# Patient Record
Sex: Female | Born: 1945
Health system: Southern US, Community
[De-identification: ages and names within clinical notes are randomized; demographics above are authoritative.]

## PROBLEM LIST (undated history)

## (undated) DIAGNOSIS — H269 Unspecified cataract: Secondary | ICD-10-CM

## (undated) DIAGNOSIS — Z8701 Personal history of pneumonia (recurrent): Secondary | ICD-10-CM

## (undated) DIAGNOSIS — M069 Rheumatoid arthritis, unspecified: Secondary | ICD-10-CM

## (undated) DIAGNOSIS — M797 Fibromyalgia: Secondary | ICD-10-CM

## (undated) DIAGNOSIS — R519 Headache, unspecified: Secondary | ICD-10-CM

## (undated) DIAGNOSIS — R5382 Chronic fatigue, unspecified: Secondary | ICD-10-CM

## (undated) DIAGNOSIS — R42 Dizziness and giddiness: Secondary | ICD-10-CM

## (undated) DIAGNOSIS — F329 Major depressive disorder, single episode, unspecified: Secondary | ICD-10-CM

## (undated) DIAGNOSIS — Z973 Presence of spectacles and contact lenses: Secondary | ICD-10-CM

## (undated) DIAGNOSIS — R51 Headache: Secondary | ICD-10-CM

## (undated) DIAGNOSIS — Z8709 Personal history of other diseases of the respiratory system: Secondary | ICD-10-CM

## (undated) DIAGNOSIS — H409 Unspecified glaucoma: Secondary | ICD-10-CM

## (undated) DIAGNOSIS — E119 Type 2 diabetes mellitus without complications: Secondary | ICD-10-CM

## (undated) DIAGNOSIS — F32A Depression, unspecified: Secondary | ICD-10-CM

## (undated) DIAGNOSIS — G459 Transient cerebral ischemic attack, unspecified: Secondary | ICD-10-CM

## (undated) DIAGNOSIS — E7800 Pure hypercholesterolemia, unspecified: Secondary | ICD-10-CM

## (undated) HISTORY — PX: TONSILLECTOMY: SUR1361

## (undated) HISTORY — PX: KNEE ARTHROSCOPY: SUR90

## (undated) HISTORY — PX: DILATION AND CURETTAGE OF UTERUS: SHX78

## (undated) HISTORY — PX: APPENDECTOMY: SHX54

## (undated) HISTORY — PX: MOUTH SURGERY: SHX715

## (undated) HISTORY — PX: BILATERAL CARPAL TUNNEL RELEASE: SHX6508

## (undated) HISTORY — PX: GLAUCOMA SURGERY: SHX656

## (undated) HISTORY — PX: BACK SURGERY: SHX140

## (undated) NOTE — *Deleted (*Deleted)
Cardiology Office Note:    Date:  11/28/2019   ID:  Chinita Greenland, DOB May 08, 1945, MRN [id number] Poor p.o. PCP:  Juluis Rainier, MD  Cardiologist:  No primary care provider on file.   Referring MD: Cliffton Asters, MD   No chief complaint on file.   History of Present Illness:    [name] is a 7 y.o. female with a hx of elevated troponin T and d-dimer referred for clinical evaluation.Was sent to ER 09/14/19 with DC AMA due wait. History of infected right hip prosthesis, primary hypertension, hyperlipidemia, type 2 diabetes mellitus, and stress and anxiety related to her medical problems. Less than 20 11 PM I am glad to sleep last night  The patient saw Dr. Juluis Rainier on 09/14/2019 and relate a chest pain episode that occurred on the night prior to the office visit that lasted 1.5 hours. A troponin T was ordered and was elevated at 21 with upper limit of normal being 14 ng/L. D-dimer was also elevated at 3.22. Because of these laboratory abnormalities, she was instructed to go to the emergency room. She did go to the [facility] emergency room but left AGAINST MEDICAL ADVICE after staying there for an extended period of time without active management occurring. An EKG done in Dr. Zachery Dauer office revealed normal sinus rhythm, nonspecific T wave flattening, and left anterior hemiblock.  Since the episode of chest pain***  Past Medical History:  Diagnosis Date  . Chronic fatigue   . Depression   . Diabetes mellitus without complication (HCC)   . Early cataracts, bilateral   . Fibromyalgia   . Glaucoma   . Headache    Vestibular migraines  . History of bronchitis   . History of pneumonia   . Hypercholesterolemia   . RA (rheumatoid arthritis) (HCC)   . RA (rheumatoid arthritis) (HCC)   . TIA (transient ischemic attack)   . Vertigo   . Wears glasses     Past Surgical History:  Procedure Laterality Date  . APPENDECTOMY    . BACK SURGERY  2012, 2017  .  BILATERAL CARPAL TUNNEL RELEASE    . CESAREAN SECTION     x3  . DILATION AND CURETTAGE OF UTERUS    . GLAUCOMA SURGERY    . KNEE ARTHROSCOPY Left   . MOUTH SURGERY    . TONSILLECTOMY    . TOTAL HIP ARTHROPLASTY Right 01/30/2017   Procedure: RIGHT TOTAL HIP ARTHROPLASTY ANTERIOR APPROACH;  Surgeon: Kathryne Hitch, MD;  Location: [facility] ORS;  Service: Orthopedics;  Laterality: Right;  . TOTAL HIP REVISION Right 07/28/2019   Procedure: TOTAL HIP REVISION, posterior approach femoal comp;  Surgeon: Samson Frederic, MD;  Location: [facility] OR;  Service: Orthopedics;  Laterality: Right;    Current Medications: No outpatient medications have been marked as taking for the 11/29/19 encounter (Appointment) with Lyn Records, MD.     Allergies:   Sulfa antibiotics   Social History   Socioeconomic History  . Marital status: Widowed    Spouse name: Not on file  . Number of children: 4  . Years of education: 65  . Highest education level: Not on file  Occupational History  . Occupation: Retired    Comment: Runner, broadcasting/film/video  Tobacco Use  . Smoking status: Never Smoker  . Smokeless tobacco: Never Used  Vaping Use  . Vaping Use: Never used  Substance and Sexual Activity  . Alcohol use: No  . Drug use: No  . Sexual activity: Not  on file  Other Topics Concern  . Not on file  Social History Narrative   Lives at home alone   Right-handed   Caffeine: tea daily   Has 10 grandchildren   Social Determinants of Health   Financial Resource Strain:   . Difficulty of Paying Living Expenses: Not on file  Food Insecurity:   . Worried About Programme researcher, broadcasting/film/video in the Last Year: Not on file  . Ran Out of Food in the Last Year: Not on file  Transportation Needs:   . Lack of Transportation (Medical): Not on file  . Lack of Transportation (Non-Medical): Not on file  Physical Activity:   . Days of Exercise per Week: Not on file  . Minutes of Exercise per Session: Not on file  Stress:   . Feeling of  Stress : Not on file  Social Connections:   . Frequency of Communication with Friends and Family: Not on file  . Frequency of Social Gatherings with Friends and Family: Not on file  . Attends Religious Services: Not on file  . Active Member of Clubs or Organizations: Not on file  . Attends Banker Meetings: Not on file  . Marital Status: Not on file     Family History: The patient's family history includes Diabetes in her father and mother; Heart disease in her father, mother, and sister; High blood pressure in her father; Stroke in her father and mother.  ROS:   Please see the history of present illness.    *** All other systems reviewed and are negative.  EKGs/Labs/Other Studies Reviewed:    The following studies were reviewed today: ***  EKG:  EKG the [facility] health emergency room EKG done on 09/16/2019 revealed T wave flattening, normal sinus rhythm, left axis deviation, with poor R wave progression. This EKG was not substantially different than the tracing performed 1 day prior in Dr. Zachery Dauer office.  Recent Labs: 09/14/2019: ALT 11; BUN 10; Creatinine, Ser 0.92; Hemoglobin 10.8; Platelets 374; Potassium 3.8; Sodium 139  Recent Lipid Panel    Component Value Date/Time   CHOL (H) 02/25/2007 0520    217        ATP III CLASSIFICATION:  <200     mg/dL   Desirable  161-096  mg/dL   Borderline High  >=045    mg/dL   High   TRIG 409 81/19/1478 0520   HDL 33 (L) 02/25/2007 0520   CHOLHDL 6.6 02/25/2007 0520   VLDL 29 02/25/2007 0520   LDLCALC (H) 02/25/2007 0520    155        Total Cholesterol/HDL:CHD Risk Coronary Heart Disease Risk Table                     Men   Women  1/2 Average Risk   3.4   3.3    Physical Exam:    VS:  There were no vitals taken for this visit.    Wt Readings from Last 3 Encounters:  07/28/19 190 lb (86.2 kg)  07/20/19 200 lb 3 oz (90.8 kg)  04/27/19 196 lb (88.9 kg)     GEN: ***. No acute distress HEENT: Normal NECK: No JVD.  LYMPHATICS: No lymphadenopathy CARDIAC: *** RRR without murmur, gallop, or edema. VASCULAR: *** Normal Pulses. No bruits. RESPIRATORY:  Clear to auscultation without rales, wheezing or rhonchi  ABDOMEN: Soft, non-tender, non-distended, No pulsatile mass, MUSCULOSKELETAL: No deformity  SKIN: Warm and dry NEUROLOGIC:  Alert and oriented  x 3 PSYCHIATRIC:  Normal affect   ASSESSMENT:    No diagnosis found. PLAN:    In order of problems listed above:  1. ***   Medication Adjustments/Labs and Tests Ordered: Current medicines are reviewed at length with the patient today.  Concerns regarding medicines are outlined above.  No orders of the defined types were placed in this encounter.  No orders of the defined types were placed in this encounter.   There are no Patient Instructions on file for this visit.   Signed, Lesleigh Noe, MD  11/28/2019 11:32 AM    [facility]

---

## 1997-08-25 ENCOUNTER — Other Ambulatory Visit: Admission: RE | Admit: 1997-08-25 | Discharge: 1997-08-25 | Payer: Self-pay | Admitting: Obstetrics and Gynecology

## 1999-06-13 ENCOUNTER — Other Ambulatory Visit: Admission: RE | Admit: 1999-06-13 | Discharge: 1999-06-13 | Payer: Self-pay | Admitting: *Deleted

## 2000-05-06 ENCOUNTER — Other Ambulatory Visit: Admission: RE | Admit: 2000-05-06 | Discharge: 2000-05-06 | Payer: Self-pay | Admitting: *Deleted

## 2000-06-06 ENCOUNTER — Ambulatory Visit (HOSPITAL_COMMUNITY): Admission: RE | Admit: 2000-06-06 | Discharge: 2000-06-06 | Payer: Self-pay | Admitting: *Deleted

## 2000-06-06 ENCOUNTER — Encounter (INDEPENDENT_AMBULATORY_CARE_PROVIDER_SITE_OTHER): Payer: Self-pay

## 2000-07-16 ENCOUNTER — Other Ambulatory Visit: Admission: RE | Admit: 2000-07-16 | Discharge: 2000-07-16 | Payer: Self-pay | Admitting: *Deleted

## 2002-01-26 ENCOUNTER — Encounter: Admission: RE | Admit: 2002-01-26 | Discharge: 2002-01-26 | Payer: Self-pay | Admitting: Orthopedic Surgery

## 2002-01-26 ENCOUNTER — Encounter: Payer: Self-pay | Admitting: Orthopedic Surgery

## 2002-05-20 ENCOUNTER — Other Ambulatory Visit: Admission: RE | Admit: 2002-05-20 | Discharge: 2002-05-20 | Payer: Self-pay | Admitting: Obstetrics and Gynecology

## 2004-10-05 ENCOUNTER — Ambulatory Visit (HOSPITAL_COMMUNITY): Admission: RE | Admit: 2004-10-05 | Discharge: 2004-10-05 | Payer: Self-pay | Admitting: Family Medicine

## 2004-12-26 ENCOUNTER — Encounter: Admission: RE | Admit: 2004-12-26 | Discharge: 2004-12-26 | Payer: Self-pay | Admitting: Orthopedic Surgery

## 2007-02-25 ENCOUNTER — Encounter (INDEPENDENT_AMBULATORY_CARE_PROVIDER_SITE_OTHER): Payer: Self-pay | Admitting: Neurology

## 2007-02-25 ENCOUNTER — Inpatient Hospital Stay (HOSPITAL_COMMUNITY): Admission: EM | Admit: 2007-02-25 | Discharge: 2007-02-27 | Payer: Self-pay | Admitting: Emergency Medicine

## 2007-02-26 ENCOUNTER — Encounter (INDEPENDENT_AMBULATORY_CARE_PROVIDER_SITE_OTHER): Payer: Self-pay | Admitting: Internal Medicine

## 2007-08-21 ENCOUNTER — Encounter: Admission: RE | Admit: 2007-08-21 | Discharge: 2007-08-21 | Payer: Self-pay | Admitting: Orthopedic Surgery

## 2007-08-28 ENCOUNTER — Ambulatory Visit (HOSPITAL_COMMUNITY): Admission: RE | Admit: 2007-08-28 | Discharge: 2007-08-28 | Payer: Self-pay | Admitting: Orthopedic Surgery

## 2010-02-19 ENCOUNTER — Encounter
Admission: RE | Admit: 2010-02-19 | Discharge: 2010-02-19 | Payer: Self-pay | Source: Home / Self Care | Attending: Neurological Surgery | Admitting: Neurological Surgery

## 2010-06-15 ENCOUNTER — Encounter (HOSPITAL_COMMUNITY)
Admission: RE | Admit: 2010-06-15 | Discharge: 2010-06-15 | Disposition: A | Discharge status: Home / Self Care | Payer: BC Managed Care – PPO | Source: Ambulatory Visit | Attending: Neurological Surgery | Admitting: Neurological Surgery

## 2010-06-15 ENCOUNTER — Other Ambulatory Visit (HOSPITAL_COMMUNITY): Payer: Self-pay | Admitting: Neurological Surgery

## 2010-06-15 ENCOUNTER — Ambulatory Visit (HOSPITAL_COMMUNITY)
Admission: RE | Admit: 2010-06-15 | Discharge: 2010-06-15 | Disposition: A | Discharge status: Home / Self Care | Payer: BC Managed Care – PPO | Source: Ambulatory Visit | Attending: Neurological Surgery | Admitting: Neurological Surgery

## 2010-06-15 DIAGNOSIS — M48061 Spinal stenosis, lumbar region without neurogenic claudication: Secondary | ICD-10-CM | POA: Insufficient documentation

## 2010-06-15 DIAGNOSIS — Z01818 Encounter for other preprocedural examination: Secondary | ICD-10-CM | POA: Insufficient documentation

## 2010-06-15 DIAGNOSIS — Z01812 Encounter for preprocedural laboratory examination: Secondary | ICD-10-CM | POA: Insufficient documentation

## 2010-06-15 LAB — BASIC METABOLIC PANEL
BUN: 22 mg/dL (ref 6–23)
CO2: 31 mEq/L (ref 19–32)
Calcium: 11.1 mg/dL — ABNORMAL HIGH (ref 8.4–10.5)
Chloride: 102 mEq/L (ref 96–112)
Creatinine, Ser: 0.62 mg/dL (ref 0.4–1.2)

## 2010-06-15 LAB — CBC
MCH: 30.5 pg (ref 26.0–34.0)
MCV: 94.8 fL (ref 78.0–100.0)
Platelets: 286 10*3/uL (ref 150–400)
RDW: 13.5 % (ref 11.5–15.5)
WBC: 7.6 10*3/uL (ref 4.0–10.5)

## 2010-06-15 LAB — DIFFERENTIAL
Basophils Relative: 0 % (ref 0–1)
Eosinophils Absolute: 0.1 10*3/uL (ref 0.0–0.7)
Eosinophils Relative: 1 % (ref 0–5)
Lymphs Abs: 4 10*3/uL (ref 0.7–4.0)
Neutrophils Relative %: 40 % — ABNORMAL LOW (ref 43–77)

## 2010-06-15 LAB — TYPE AND SCREEN

## 2010-06-15 LAB — APTT: aPTT: 25 seconds (ref 24–37)

## 2010-06-15 LAB — ABO/RH: ABO/RH(D): O NEG

## 2010-06-15 LAB — PROTIME-INR: Prothrombin Time: 13 seconds (ref 11.6–15.2)

## 2010-06-19 NOTE — H&P (Signed)
[name], [name]              ACCOUNT NO.:  [id number]   MEDICAL RECORD NO.:  [id number]          PATIENT TYPE:  EMS   LOCATION:  MAJO                         FACILITY:  [facility]   PHYSICIAN:  Vinnie Level, MD    DATE OF BIRTH:  [birth year]   DATE OF ADMISSION:  02/24/2007  DATE OF DISCHARGE:                              HISTORY & PHYSICAL   PRIMARY CARE PHYSICIAN:  [name], M.D.  Dr. Vedia Coffer.   CHIEF COMPLAINT:  Weakness, confusion.   HISTORY OF PRESENT ILLNESS:  The patient is a 65 year old white female  with history of diabetes, who presents to the emergency department after  her daughters noted acute confusion.  Apparently she was watching TV and  then fell asleep on the couch.  Prior to watching TV, the patient  reported a vague headache and some nausea, no frank emesis.  She lay  down on the cough with general feeling of weakness, was down for an  unknown period of time when her daughters awoke per report, she was  having difficulty finding words and trying to tell her daughters to do  her home work.  Now the patient is frankly aphasic and having difficulty  with word production.  Comprehension appears intact but has acute  apraxia (difficulty utilizing specific objects) and inability to write  and difficulties with word finding.  Her daughters are not present at  the bedside at the present time.   PAST MEDICAL HISTORY:  1. Diabetes.   MEDICATIONS:  1. Actos 30 mg daily.  2. Tramadol 1 to 2 mg daily.  3. Recently the patient was on azithromycin for what sounds like a      upper respiratory infection and pseudoephedrine per patient's      report.  She is unsure when she last took the last dose of      pseudoephedrine.   ALLERGIES:  PENICILLIN which as a child caused hives.   SOCIAL HISTORY:  She lives with her four daughters, one of whom just  recently graduated from college.  She denies smoking, alcohol or other  drug use.  She notes she does not exercise  much.  She notes her diet is  poor.  She has extensive history of diabetes in the family with all her  own mother, father and siblings.  Apparently her husband had two  strokes.   REVIEW OF SYSTEMS:  CONSTITUTIONAL:  The patient denies recent fevers,  chills, sweats, weight changes or adenopathy.  HEENT:  Did endorse some vague headache, no sore throat.  Did recently  have some nasal discharge and occasional cough, no recent voice changes,  photophobia or changes in hearing or vision.  SKIN:  No rash or lesions.  CARDIOPULMONARY:  No chest pain, shortness of breath, dyspnea on  exertion, no palpitation.  She does have a cough that has been  productive.  No wheezing.  GENITOURINARY:  No urinary frequency, hesitancy, hematuria, nocturia.  NEUROPSYCH:  No numbness, mood disturbance, depression or anxiety.  She  did have marked weakness which is global.  She denied one side being  worse  than the other.  MUSCULOSKELETAL:  Full range of motion, no joint deformity.  GI:  Positive nausea, no emesis, normal stools, no bright red blood, no  melena, no hematemesis.  No change in bowel habits.  ENDOCRINE:  No polyuria, polydipsia, heat or cold intolerance.  NEUROLOGIC:  Acute word finding difficulty.  All other systems are reviewed and are negative except as noted above in  the HPI.   CODE STATUS:  Full.   OBJECTIVE:  VITAL SIGNS:  Temperature 98.7, pulse 84 to 102, respiratory  rate 18 to 20, blood pressure is 104 to 143/36 to 71.  Stated weight  180, oxygen saturations 99.6.  GENERAL:  No acute distress, pleasant interactive female, oriented to  place, year and president.  No acute distress.  HEENT:  Normocephalic and atraumatic, pupils are 3 to 4+ bilaterally  reactive to light.  Extraocular muscles  are intact.  Normal H test,  no  nystagmus.  Sclerae clear.  External pinna unremarkable.  Mucous  membranes moist, normal dentition.  Oropharynx clear without exudate or  erythema.  NECK:   Supple.  No jugular venous distention, no bruits.  Bilateral  normal carotid upstroke.  Adenopathy:  No cervical lymphadenopathy.  CARDIOVASCULAR:  Regular rate and rhythm, no irregularities noted.  No  murmurs, rubs or gallops.  Normal PMI.  LUNGS:  Clear to auscultation.  Occasional cough with upper airway  noise.  SKIN:  No rash or lesions.  BREASTS:  Exam deferred.  ABDOMEN:  Obese, nontender, nondistended, normal active bowel sounds.  RECTAL:  Deferred.  EXTREMITIES:  No cyanosis, clubbing or edema.  MUSCULOSKELETAL:  No joint deformity, tenderness.  NEUROLOGIC:  Alert, oriented x3.  Had some difficulties with word  productions, object naming.  Was intact for a pen, had difficulty naming  a television or a watch.  Was unable to add 2 + 2.  The patient had some  focal apraxia, was unable to write or attempt to draw a clock,  repeatedly drew letter M on a paper and appeared frustrated with this.  She was confusing her nursing call bell for her wallet.  Cranial nerves  2 through 12 appear grossly intact.  Strength:  Vaguely weaker on the  right than the left, brisk reflexes throughout her upper extremities,  normal in lower extremities.  Babinski is downgoing bilaterally.  Appears to have grossly normal sensation throughout.  Rapid alternating  movements bilateral upper extremities were intact and Romberg seated in  bed was normal.   RADIOLOGIC:  Head CT reveals no acute intracranial abnormality.  Right  maxillary sinusitis.   EKG reveals a rate of 100, normal sinus rhythm, left axis deviation.  No  prior.  She does appear to have first degree AV block.   LABORATORY DATA:  White count is 9.9, hemoglobin 13.3, normal  differential.  Platelet count 284,000.  Sodium is normal at 138,  potassium is 2.9, chloride 104, BUN 13, creatinine 0.7.  Initial cardiac  enzymes with MB of 1.2, troponin less than 0.05, myoglobin 61.  Coags  normal with a PTT of 25 and INR 0.9.  Urinalysis was  normal, many  squamous cells, 3 to 6 white cells, likely poor catch.   ASSESSMENT AND PLAN:  65 year old white female with history of diabetes  presents with clinical stroke and signs consistent with likely left  hemisphere small infarct.  She does have productive aphasia but her  comprehension appears fairly intact.  She has some difficulty with  naming and  apraxia as well.  1. Stroke.  No anticoagulation pending MRI.  No evidence of a bleed on      CT scan.  Routine stroke work-up including hemoglobin A1c,      transcranial Doppler, carotid Doppler and echocardiogram with      bubble study.  Will keep her on telemetry for the moment to rule      out atrial fibrillation though I see no evidence of such.  She had      no exam findings to point to an embolic phenomenon.  Neuro      apparently discussed the case with the emergency department given      that it was not a code stroke due to unclear temporal relationship      to her arrival in the emergency department they felt they would      consult in the morning.  Will check a routine fasting lipid profile      as well.  She reports no history of hypertension and has no      evidence of such at the moment.  2. Diabetes.  Will check hemoglobin A1c as above.  Continue with her      home Actos and put her on sliding scale insulin.  She does not      check her sugars at home so make sure that she has a Glucometer.  3. Fluids, electrolytes and nutrition.  Carb consistent diet.  I have      no concerns for dysphagia.  Glottal function appears intact.  Lytes      appear okay.  4. Recent upper respiratory infection.  Obviously avoid      pseudoephedrine.  No need to continue azithromycin at present time.  5. Disposition.  Admit telemetry bed.  6. Status code full.      Vinnie Level, MD  Electronically Signed     PMB/MEDQ  D:  02/25/2007  T:  02/25/2007  Job:  [id number]

## 2010-06-19 NOTE — Op Note (Signed)
NAMEMARVEEN, [name]               ACCOUNT NO.:  [id number]   MEDICAL RECORD NO.:  [id number]          PATIENT TYPE:  AMB   LOCATION:  DAY                          FACILITY:  [facility]   PHYSICIAN:  Marlowe Kays, M.D.  DATE OF BIRTH:  11-17-1945   DATE OF PROCEDURE:  08/28/2007  DATE OF DISCHARGE:                               OPERATIVE REPORT   PREOPERATIVE DIAGNOSIS:  Torn medial meniscus left knee.   POSTOPERATIVE DIAGNOSIS:  Torn medial meniscus left knee.   OPERATION:  Left knee arthroscopy, with partial medial meniscectomy.   SURGEON:  Marlowe Kays, M.D.   ASSISTANT:  Nurse.   ANESTHESIA:  General.   PATHOLOGY AND INDICATIONS OF PROCEDURE:  Because of pain in the inner  aspect of the left knee, an MRI was done.  This showed a midbody tear of  the medial meniscus.  There was also some question about the integrity  of the ACL.  (See description below.)   PROCEDURE:  Satisfactory general anesthesia, pneumatic tourniquet.  Left  leg was Esmarched out nonsterilely.  Thigh stabilizer applied.  Ace wrap  and knee support to right lower extremity.  Time-out performed.  DuraPrep from thigh stabilizer to ankle.  This area was draped into a  sterile field.  Superomedial saline inflow.  First, through an  anterolateral portal, the  medial compartment of the knee joint was  evaluated.  She had minimal chondromalacia.  She had a moderate tear  beginning at about the midbody, as describe on the MRI, into the  posterior third.  I resected this back to a stable rim with a  combination of baskets and shaving down until smooth with a 3.5 shaver,  with the remaining rim being stable on probing.  Looking at the medial  gutter and suprapatellar area, there was minimal wear in the patella.  Her ACL appeared to be completely intact, and a representative picture  was taken.  I then reversed portals.  There was some minimal wear in the  lateral joint, but no real tear.  The joint was then  irrigated until  clear and all fluid possible removed.  The two anterior portals were  closed with 4-0 nylon.  I injected 20 mL of 0.5% Marcaine with  adrenaline and 4 mg of morphine through the inflow apparatus, which was  removed, and this portal closed with 4-0 nylon as well.  Betadine and  Adaptic dry sterile dressing were applied.  Tourniquet was released.  She tolerated the procedure well and was taken to the recovery room in  satisfactory condition, with no known complications.           ______________________________  Marlowe Kays, M.D.     JA/MEDQ  D:  08/28/2007  T:  08/28/2007  Job:  [id number]

## 2010-06-19 NOTE — Consult Note (Signed)
NAME:  [name], FARAONE              ACCOUNT NO.:  [id number]   MEDICAL RECORD NO.:  [id number]          PATIENT TYPE:  INP   LOCATION:  3040                         FACILITY:  [facility]   PHYSICIAN:  Melvyn Novas, M.D.  DATE OF BIRTH:  1945/10/30   DATE OF CONSULTATION:  DATE OF DISCHARGE:                                 CONSULTATION   PATIENT INFORMATION:  This patient is a 65 year old Caucasian married  right-handed married female admitted to  Hollice Espy, M.D., February 25, 2007 at 3 a.m.   HISTORY:  This patient is a 65 year old married right-handed Caucasian  female with a history of non-insulin dependent diabetes mellitus, who  yesterday was in her usual state of health except for recovering from a  nasty upper respiratory tract infection.  She had just finished a Z-Pak  she states.  Her daughter a left her on the sofa when she went to work  after having a normal conversation with her.  When her daughter returned  almost 12 hours later, her mother was still in the same position on the  sofa, had not dressed, had not eaten and was just not herself.  Normally  a very active person according to daughter and husband.  She was abulic,  nauseated.  She did not use her right hand with the same dexterity.  She  appeared very clumsy and ataxic.  They also noted that her speech was  hesitant.  She seemed not to come out with the words or understand what  we say.   PAST MEDICAL HISTORY:  1. Diabetes.  2. Obesity.  The patient has a rather large neck for one woman.  I      raised the question of possible obstructive sleep apnea to make Dr.      Mellody Drown aware of a possible evaluation by PSG.   SOCIAL HISTORY:  The patient is married, has an adult daughter and  husband who are still in the work force.   HOME MEDICATIONS:  1. Actos 30 mg.  2. Recently finished a Z-Pak for upper respiratory tract infection.  3. She takes tramadol p.r.n. for back pain.   REVIEW OF SYSTEMS:   Positive for nausea, headache, stuttering and  hesitant speech.  In her own words, I cannot think.  I do not want to  eat.  She denies any vomiting.  She denies any pain except headaches.  No cough.  She does feels slightly confused.  Visually, she states that  bright lights have bothered her during the exam.  Her vital signs are  now stable and she has been afebrile.  A CT scan in the ER last night  showed no abnormality.   Today's vital signs show a temperature of 98 degrees Fahrenheit, pulse  of 84 that is regular, respirations 20.  There are no rales, no rhonchi.  Systolic blood pressures  123/68 diastolic, room air saturation is 96.  The patient was given 4  liters of oxygen according to the 6 a.m. vital signs entry.  When I  examined her, she was not on nasal oxygen.   LABORATORY  DATA:  The patient's creatine kinase was 82.  Troponin was  0.02.  There was no evidence of cardiac problem.  Sodium 138, potassium  3.8, glucose was elevated in the morning at 168, but the patient was not  able to take her regular medication yesterday.  A lipid profile had been  already added.  Fasting lipid cholesterol was 217 it is therefore  elevated, triglyceride 144, HDL is low at 33, LDL is high at 155.  A CBC  with diff showed a white blood cell count of 11.7, H&H 12.8 and 36.8,  platelet count was 258.  A routine urinalysis showed cloudy urine  without white blood cells.  A tox screen was not yet sent.   Mental status:  The patient is lethargic, almost abulic.  She responds  to bright light evaluation of her pupils with painful grimacing.  She  can speak, but she has a rather attenuated voice and she seems to have  difficulties processing verbal orders such as touch your nose with your  left hand.  She also is almost deliberately slow with her verbal  information and seems to search for words.  Cranial nerve examination  shows no facial asymmetry.  No nystagmus.  Full visual fields bilateral   and pupil reaction is equal.  There is no papilledema.  Tongue and uvula  deviation is not seen.  There is no facial numbness. In the motor  examination, deep tendon reflexes are symmetric, but there is an upgoing  toe on the right.  Her right grip is weaker.  Her right finger in the  finger-to-nose test is dysmetric and very slow.  Deep tendon reflexes  were actually rather brisk considering that this patient is diabetic.  She had normal sensory to touch and pinprick.  I did not observe her  gait, but according to her nurse, she was slow and unsteady.  She could  sit unattended, however, on the bedside without drifting to one side.   ASSESSMENT:  1. I suspect that the patient might have suffered a left brain stroke,      parietal temporal in a middle cerebral artery branch that left her      with mild aphasia or dysphasia and right-sided dysmetria.  There is      interestingly no sensory deficit.  2. She seems to describe a migraine headache.  3. She is just overcoming an upper respiratory tract infection.  Her      elevated white blood cell count might indicate that she is not yet      out of the woods.   PLAN:  I would obtain an MRI today, carotid Doppler and 2-D echo.  The  lipid panel cardiac enzymes that I had recommended on my order sheets  were already done and do not have to be repeated.  I would like to add a  tox screen.  I would like to give the patient  Depakene 1 gm fast  infusion by piggy-back.  The 1 gram should be given within 15 minutes  total as this helps with migrainous headaches and is not vasotonic.  The  stroke service was advised this morning of the patient.  I gave my check  out to [facility] by the nurse practitioner on the stroke service this  morning as Dr. Pearlean Brownie will round later today.      Melvyn Novas, M.D.  Electronically Signed     CD/MEDQ  D:  02/25/2007  T:  02/25/2007  Job:  [id number] 

## 2010-06-19 NOTE — Procedures (Signed)
EEG NUMBER:  [id number]   CLINICAL HISTORY:  The patient is a 65 year old who presented emergency  room found by his daughters with confusion and inability to use the  right hand.  The patient was not able to express herself. (293.0,784.3)   PROCEDURE:  The tracing is carried out on a 32 channel digital Cadwell  recorder reformatted into 16 channel montages with one devoted to EKG.  The patient was awake and asleep during the recording.  Medications  include Zofran, Lovenox, Actos, NovoLog, Tylenol, morphine sulfate,  Reglan, Avelox, Zocor, guaifenesin, Depakote, Phenergan, Ultram.  The  International 10/20 system lead placement was used.   DESCRIPTION OF FINDINGS:  Dominant frequency is 10 Hz 40 microvolt  activity mixed frequency 45-60 microvolt theta range activity, and 2-3  Hz 50 microvolt delta range activity was superimposed upon this.  There  is no focal slowing.  There was no interictal epileptiform activity form  of spikes or sharp waves.   EKG showed regular sinus rhythm with ventricular response of 78 beats  per minute.  Photic stimulation was induced.  It was performed and  failed to induce a driving response.  Hyperventilation caused arousal.   IMPRESSION:  Abnormal EEG on the basis of diffuse background slowing.  This is a nonspecific indicator of neuronal dysfunction that may be on a  primary degenerative basis or secondary to a variety of toxic or  metabolic etiologies.  The findings require clinical correlation.      Deanna Artis. Sharene Skeans, M.D.  Electronically Signed     [id number]  D:  02/26/2007 20:57:14  T:  02/27/2007 09:55:14  Job #:  [id number]   cc:   Melvyn Novas, M.D.  Fax: [phone]

## 2010-06-19 NOTE — Discharge Summary (Signed)
[name], [name]              ACCOUNT NO.:  [id number]   MEDICAL RECORD NO.:  [id number]          PATIENT TYPE:  INP   LOCATION:  3040                         FACILITY:  [facility]   PHYSICIAN:  Ramiro Harvest, MD    DATE OF BIRTH:  1945/03/12   DATE OF ADMISSION:  02/24/2007  DATE OF DISCHARGE:  02/27/2007                               DISCHARGE SUMMARY   PRIMARY CARE PHYSICIAN:  [facility].   DISCHARGE DIAGNOSES:  1. Left hemispheric transient ischemic attack.  2. Right maxillary sinusitis.  3. Type 2 diabetes.  4. Migraine headaches.  5. Hyperlipidemia.  6. Bilateral upper respiratory infection.  7. Hypokalemia.   DISCHARGE MEDICATIONS:  1. Avelox 400 mg p.o. daily times 8 days.  2. Mucinex 600 mg p.o. b.i.d. times 8 days.  3. Zocor 20 mg p.o. daily.  4. Aspirin 81 mg p.o. daily times 1 week and then off.  5. Aggrenox 1 tablet p.o. daily times 1 week and then 1 tablet p.o.      b.i.d.  6. Actos 30 mg p.o. daily.   DISPOSITION AND FOLLOW UP:  The patient will be discharged home to  follow up with her PCP in the next 1-2 weeks to reassess patient's  diabetes and maxillary sinusitis.  A basic metabolic profile needs to be  checked to follow up on the patient's electrolytes.  The patient will  also need to call to schedule a follow-up with Dr. Pearlean Brownie for outpatient  TCD bubble study and emboli study within the next month.  The patient  will need to call [phone] to get that set up.  The patient will also  need to need to be referred for a sleep study to rule out obstructive  sleep apnea.  The patient's PCP may call to schedule this outpatient  study for obstructive sleep apnea by calling [phone] with [facility]  Neurology to set this up. A C-met will likely need to be checked in  about 6-8 weeks to follow up on the patient's hepatic function.   CONSULTATIONS:  A neurology consult was done on February 25, 2007.  The  patient was seen by Dr. Porfirio Mylar  Dohmeier of [facility] neurology.   PROCEDURES DONE.:  1. 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  2. An MRI of the head was done on February 25, 2007 which showed no      acute infarct.  No intracranial mass detected in this unenhanced      examination, paranasal sinus mucosal thickening with air-fluid      level of the right maxillary sinus.  3. 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  Cavernous segment cannot be excluded.  Areas of minimal      vessels irregularity as discussed above.  4. Carotid Dopplers  were obtained on February 26, 2007 and the      preliminary did show a right 40-60% lower endobranch distal ICA      stenosis, probably due to tortuosity, left- no evidence of ICA      stenosis.  Bilateral vertebral artery flow was antegrade.  5. A 2-D echo was also obtained on February 26, 2007 which showed a      normal systolic function and EF of 60%, no left ventricular      regional wall motion abnormalities.  Left ventricular wall      thickness upper limits of normal.  No echocardiographic evidence      for a cardiac source of embolism.  6. Also an EEG was also done on February 25, 2007 with results pending      which may be followed up as an outpatient.   BRIEF ADMISSION HISTORY AND PHYSICAL:  [name] is a 65 year old  white female with a history of diabetes who presented to the ED after  her daughters noted acute confusion.  Apparently the patient was  watching TV and then fell asleep on the couch.  Prior to watching TV the  patient reported a vague headache and some nausea, no frank emesis.  The  patient lay on the couch with a general feeling of weakness.  Was down  for an unknown period of time.  When her daughter's woke her per  report  she was having difficulty finding words and trying to tell her daughters  to do her homework.  Now the patient is frankly aphasic and having  difficulty with word production.  Comprehension appeared intact but had  acute apraxia, difficulty utilizing specific objects and inability to  write and difficulties with word finding.  The patient's daughters were  not present at the bedside at the time of examination.   PHYSICAL EXAMINATION:  VITAL SIGNS:  Temperature 98.7, pulse of 84-102,  respiratory rate 18, blood pressure 143/71, sating 99.6%.  GENERAL:  The patient was in no acute distress, pleasant, interactive  female oriented to place, year and present in no acute distress.  HEENT: Normocephalic, atraumatic.  Pupils equal, round and reactive to  light.  Extraocular muscles were intact.  No nystagmus.  Sclera was  clear.  External pinna unremarkable.  Mucous membranes were moist.  Normal dentition.  Oropharynx was clear without exudate or erythema.  NECK:  Supple.  No JVD, no bruits.  Bilateral normal carotid upstroke.  No lymphadenopathy.  CARDIOVASCULAR:  Regular rate and rhythm.  No  murmurs, rubs or gallops.  LUNGS:  Clear to auscultation bilaterally.  Occasional cough with upper  airway noise.  SKIN:  No rashes or lesions.  ABDOMEN:  Soft, nontender, nondistended, positive bowel sounds.  EXTREMITIES:  No clubbing, cyanosis or edema.  NEUROLOGIC:  Alert and oriented x3.  Difficulties with word production  and object naming.  Intact for pain.  Had difficulty naming television or watch.  Was unable  to add 2+2.  The patient had some focal apraxia.  Was unable to write or  attempt to draw a clock.  Repeatedly during letter M on the paper and  appeared frustrated with this. The patient was confusing the nursing  call bell  for her wallet.  Cranial nerves II-XII appeared grossly  intact.  Strength vaguely weaker on the right than the left with brisk  reflexes throughout upper  extremities.  Normal in the lower extremities.  Babinski's are downgoing bilaterally.  Appeared to have  a grossly normal  sensation throughout.  Rapid alternating movements bilaterally in the  upper extremities were intact.  Romberg seated in the bed was normal.   ADMISSION LABS:  Head CT with results as stated above revealed no acute  intracranial abnormality, right maxillary sinusitis.  EKG; rate of 100,  normal sinus rhythm, left axis deviation.  No prior EKGs to compare.  Questionable first-degree AV block.  White count 9.9, hemoglobin 13.3,  platelets 284,000, sodium 138, potassium 2.9, chloride 104, BUN 13,  creatinine 0.7.  Initial cardiac enzymes; CK-MB of 1.2, troponin less  than 0.05, myoglobin 61.  Coag's normal.  PTT 25 and INR 0.9.  Urinalysis was normal.   HOSPITAL COURSE:  1. Left hemispheric TIA.  The patient's symptoms improved rapidly      during the hospitalization and was close to baseline after the      first day of admission.  A complete stroke workup was ordered and      done including A1c, carotid Dopplers, 2-D echo, CTs of the head,      MRI of the head as well with results as stated above.  Initial      workup came back negative.  The patient was placed on aspirin.      Neurology was consulted. They came and saw the patient in      consultation.  Felt the patient may have had a left hemispheric      TIA.  To continue long-term risk factor modification including      diabetes control, blood pressure control and hyperlipidemia.  The      patient was started on Zocor during that hospitalization and will      be continued on Zocor as an outpatient.  The patient will be      discharged home on aspirin 81 mg daily x 1 week in combination with      Aggrenox 1 tablet daily x 1 week and then Aggrenox will be      increased to 1 tablet twice a day.  The patient will follow-up with      Dr. Pearlean Brownie and have outpatient TCD bubble study and emboli study in      a month and  will follow-up in terms of a TIA with [facility]      Neurology.  On the day of discharge the patient was in improved and      stable condition and back to her baseline.  The patient will be      discharged home in stable and improved condition.  2. Type 2 diabetes.  Hemoglobin A1c was checked during the      hospitalization.  The patient's hemoglobin A1c was 6.4.  The      patient was maintained on a home dose of Actos.  The patient will      need to follow up with her diabetes with her outpatient DO      composite.  3. Right maxillary sinusitis as noted per CT.  The patient was started      on Avelox 400 mg daily.  The patient will complete a 10-day course      of Avelox 400 mg daily and will follow up with her PCP as an      outpatient.  4. Hypokalemia.  The patient's potassium was repleted during the      hospitalization.  A basic metabolic profile needs to be followed up      as an outpatient to  follow up on the patient's electrolytes.  5. Hyperlipidemia.  A fasting lipid panel was obtained during the      stroke workup.  The patient did have a total cholesterol of 217,      HDL of 33, LDL 155.  The patient was started on Zocor 20 mg daily.      This will need to be followed up as outpatient per PCP.  The      patient will likely need a comprehensive metabolic profile in a few      weeks to follow up on the patient's hepatic function.  6. Viral upper respiratory infection, stable.  The patient was placed      on Mucinex for symptomatic cough control and will be discharged      home on 8 days of Mucinex.   On the day of discharge the patient was in stable and improved  condition.  Discharge vital signs:  Temperature 98.3, pulse of 66, blood  pressure 127/76, respiratory rate 18, sating 94% on room air.   DISCHARGE LABS:  Sodium 141, potassium 3.3, chloride 106, bicarb 30, BUN  11, creatinine 0.62, glucose of 96, calcium of 9.3, white count 7.8,  hemoglobin 11.9, platelets of 219,  hematocrit 34.9.   It has been a pleasure taking care of Ms. Stonehocker.      Ramiro Harvest, MD  Electronically Signed     DT/MEDQ  D:  02/27/2007  T:  02/27/2007  Job:  [id number]   cc:   Pramod P. Pearlean Brownie, MD  Melvyn Novas, M.D.

## 2010-06-21 ENCOUNTER — Inpatient Hospital Stay (HOSPITAL_COMMUNITY)
Admission: RE | Admit: 2010-06-21 | Discharge: 2010-06-24 | DRG: 756 | Disposition: A | Discharge status: Home / Self Care | Payer: BC Managed Care – PPO | Source: Ambulatory Visit | Attending: Neurological Surgery | Admitting: Neurological Surgery

## 2010-06-21 ENCOUNTER — Ambulatory Visit (HOSPITAL_COMMUNITY)
Admission: RE | Admit: 2010-06-21 | Discharge: 2010-06-21 | Disposition: A | Discharge status: Home / Self Care | Payer: BC Managed Care – PPO | Source: Ambulatory Visit | Attending: Neurological Surgery | Admitting: Neurological Surgery

## 2010-06-21 ENCOUNTER — Other Ambulatory Visit (HOSPITAL_COMMUNITY): Payer: Self-pay | Admitting: Neurological Surgery

## 2010-06-21 DIAGNOSIS — Z01810 Encounter for preprocedural cardiovascular examination: Secondary | ICD-10-CM

## 2010-06-21 DIAGNOSIS — M4310 Spondylolisthesis, site unspecified: Principal | ICD-10-CM | POA: Diagnosis present

## 2010-06-21 DIAGNOSIS — Z880 Allergy status to penicillin: Secondary | ICD-10-CM

## 2010-06-21 DIAGNOSIS — M545 Low back pain: Secondary | ICD-10-CM

## 2010-06-21 DIAGNOSIS — Z01812 Encounter for preprocedural laboratory examination: Secondary | ICD-10-CM

## 2010-06-22 LAB — GLUCOSE, CAPILLARY: Glucose-Capillary: 200 mg/dL — ABNORMAL HIGH (ref 70–99)

## 2010-06-22 NOTE — Op Note (Signed)
[facility]  Patient:    [name], [name]                     MRN: [id number] Proc. Date: 06/06/00 Adm. Date:  [id number] Attending:  Marina Gravel B                           Operative Report  PREOPERATIVE DIAGNOSES:       1. Postmenopausal bleeding.                               2. Endometrial polyp.  POSTOPERATIVE DIAGNOSES:      1. Postmenopausal bleeding.                               2. Endometrial polyp.  PROCEDURE:                    1. Hysteroscopy.                               2. Removal of polyp.                               3. Dilation and curettage.  SURGEON:                      Marina Gravel, M.D.  ANESTHESIA:                   MAC and 16 cc 1% Nesacaine as paracervical                               block.  FINDINGS:                     Endometrial polyp and atrophic endometrium.  SPECIMENS:                    Endometrial polyp and uterine curettings.  FLUID DEFICIT:                60 cc of Sorbitol.  IV FLUIDS:                    900 cc LR.  COMPLICATIONS:                None.  DISPOSITION:                  Recovery room, stable.  DESCRIPTION OF PROCEDURE:     The patient was taken to the operating room and MAC anesthesia obtained. She was placed in the ski position and prepped and draped in standard fashion. Bladder was emptied with red rubber catheter. The patient was examined under anesthesia and found to have a normal size uterus, no adnexal masses palpable.  A speculum was inserted in the vagina and the anterior lip of the cervix grasped with a toothed tenaculum. Paracervical block was obtained with 16 cc of 1% Nesacaine.  The uterus was sounded to approximately 6 cm. It was easily dilated to a #31 without difficulty. The operative hysteroscope was assembled and flushed with Sorbitol. It was inserted into the  cavity and uterine distention obtained with Sorbitol. A single endometrial polyp was noted. The  endometrium appeared atrophic.  The scope was removed and the polyp grasped with Randall stone forceps and removed without difficulty. The uterus was curetted and scanty amount of tissue returned and submitted separately.  The hysteroscope was reinserted and the polyp was confirmed to be removed. Therefore, the procedure was terminated.  All instruments were removed from the vagina. The cervix was made hemostatic with silver nitrate.  The patient tolerated the procedure well. There were no complications. She was taken to the recovery room in awake, alert, and stable condition. DD:  06/06/00 TD:  06/07/00 Job: [id number]

## 2010-06-23 LAB — GLUCOSE, CAPILLARY

## 2010-06-24 LAB — GLUCOSE, CAPILLARY: Glucose-Capillary: 101 mg/dL — ABNORMAL HIGH (ref 70–99)

## 2010-07-17 ENCOUNTER — Other Ambulatory Visit: Payer: Self-pay | Admitting: Neurological Surgery

## 2010-07-17 ENCOUNTER — Ambulatory Visit
Admission: RE | Admit: 2010-07-17 | Discharge: 2010-07-17 | Disposition: A | Discharge status: Home / Self Care | Payer: BC Managed Care – PPO | Source: Ambulatory Visit | Attending: Neurological Surgery | Admitting: Neurological Surgery

## 2010-07-17 DIAGNOSIS — M79609 Pain in unspecified limb: Secondary | ICD-10-CM

## 2010-07-17 DIAGNOSIS — M5137 Other intervertebral disc degeneration, lumbosacral region: Secondary | ICD-10-CM

## 2010-07-17 DIAGNOSIS — M48061 Spinal stenosis, lumbar region without neurogenic claudication: Secondary | ICD-10-CM

## 2010-07-17 DIAGNOSIS — M4310 Spondylolisthesis, site unspecified: Secondary | ICD-10-CM

## 2010-07-17 NOTE — Op Note (Signed)
[name], [name]               ACCOUNT NO.:  [id number]  MEDICAL RECORD NO.:  [id number]           PATIENT TYPE:  I  LOCATION:  3009                         FACILITY:  [facility]  PHYSICIAN:  Tia Alert, MD     DATE OF BIRTH:  [birth year]  DATE OF PROCEDURE:  06/21/2010 DATE OF DISCHARGE:                              OPERATIVE REPORT   PREOPERATIVE DIAGNOSES: 1. Degenerative spondylolisthesis L3-4, L4-5. 2. Spinal stenosis L3-4, L4-5. 3. Back pain. 4. Leg pain.  POSTOPERATIVE DIAGNOSES: 1. Degenerative spondylolisthesis L3-4, L4-5. 2. Spinal stenosis L3-4, L4-5. 3. Back pain. 4. Leg pain.  PROCEDURES: 1. Anterolateral retroperitoneal interbody fusion L3-4, L4-5 utilizing     10-mm PEEK interbody cages packed with Osteocel Plus and Actifuse     putty. 2. Posterior percutaneous pedicle screw placement L3-L5 bilaterally     utilizing invasive pedicle screw system. 3. Intraoperative EMG monitoring.  SURGEON:  Tia Alert, MD  ASSISTANT:  Reinaldo Meeker, MD  ANESTHESIA:  General endotracheal.  COMPLICATIONS:  None apparent.  INDICATIONS FOR THE PROCEDURE:  [name] is a 65 year old female who presented with severe back pain with bilateral leg pain.  She had a serial imaging over the years which showed L3-4 spondylolisthesis with spinal stenosis over the last year.  She developed a spondylolisthesis at L4-5 with spinal stenosis.  She had significant back pain with bilateral leg pain.  I recommended an instrumented fusion in hopes of improving her pain syndrome.  She understood the risks, benefits and expected outcome and wished to proceed.  DESCRIPTION OF THE PROCEDURE:  The patient was taken to the operating room and after induction of adequate generalized endotracheal anesthesia, she was rolled into the right lateral decubitus position exposing her left thigh.  She was positioned in a typical anterolateral retroperitoneal approach position and tapped in  position, AP and lateral fluoroscopy were used to position her.  Her left side was prepped with DuraPrep and then draped in usual sterile fashion.  A 5 mL of local anesthesia was injected and incision was made directly over the L4-5 interspace.  An incision was made posterolateral to this over the L3-4 interspace.  Blunt finger dissection was used into the retroperitoneal space until the anterior face of the transverse process of the psoas musculature as well at the iliac crest below.  I swept the fatty tissues of the retroperitoneal space and then swept my finger to the lateral L4- 5 incision.  I then passed my first dilator using my finger down to the psoas musculature.  We checked our twitch test and then used lateral fluoroscopy to position our first dilator over the interspace.  We checked EMG monitoring and then gone a K-wire into position.  We then used sequential dilation until our final retractor was in place.  We monitored each dilator and the final retractor.  Once retractor was in place, we used AP and lateral fluoroscopy to get into final position, checked with our ball probe to make sure there were no neural structures within the working channel.  We then placed our intradiskal shim.  The disk space was then  incised and initial diskectomy done with pituitary rongeurs.  A Cobb curette was used to release the disk from the endplates and opened the opposite annulus.  I then used scrapers and shavers to prepare the endplates of being careful not to violate the endplate.  Once the discectomy was completed and the endplates were prepared, I used a 16 paddle to pass across the opposite annulus and did check our lateral fluoroscopy to make sure our trajectory was adequate. I then used sequential trials until the 10-mm standard trial seemed to fit the best via a 10 mm x 22 mm x 50 mm PEEK interbody cage and packed this with Osteocel Plus and Actifuse putty and using sliders,  tapped into position at L4-5 from the left lateral approach utilizing AP fluoroscopy.  Once it was then placed, we checked our final construct with AP and lateral fluoroscopy, removed our inserter, irrigated with saline solution containing bacitracin, dried all bleeding points, removed our retractor and checked our final construct once again.  We then moved up to the L3-4 interspace and made our lateral incision and then used the same posterolateral incision to pass my finger to the lateral incision and passed my first dilator down to the psoas musculature.  Again the same steps were then performed.  We used sequential dilation, monitoring of dilators as we went to our final retractor was in place.  We opened our retractor, checked with a ball probe to make sure no neural structures within our working channel, placed our intradiskal shim, checked our trajectory with AP and lateral fluoroscopy and then incised the disk space and performed a diskectomy the exact same way as we have done at L4-5.  We then used sequential trials until again the 10-mm trial fit the best.  We used a 10-mm lordotic trial at this level that was 10 mm x 22 mm x 50 mm we packed with Osteocel Plus and Actifuse putty, once again using sliders we tapped it in position from a lateral approach at L3-4.  We then irrigated with saline solution containing bacitracin, dried all bleeding points, removed our retractor, checked our final construct and then the incisions were closed with 0 Vicryl in the fascia, 2-0 Vicryl in the subcutaneous tissues, 3-0 Vicryl in the subcuticular tissues.  The skin was closed with Dermabond.  The patient was then repositioned into the prone position and once again she remained hooked to EMG monitoring. Her posterior lumbar region was prepped with DuraPrep and then draped in usual sterile fashion.  We brought in AP fluoroscopy in order to place our percutaneous pedicle screws.  We started in  the AP trajectory and for each pedicle localized our skin incision site.  The skin was incised and then Jamshidi needles were used at each pedicle.  We started at the outer portion of the pedicle in the AP trajectory and tapped our Jamshidi needle through the pedicle utilizing EMG monitoring and still was at the medial wall of the pedicle on the AP trajectory.  We then switched to the lateral trajectory to make sure that our Jamshidi needle was within the vertebral body.  We then placed our K-wires and removed our Jamshidi needles.  Once our K-wires were into position, we tapped with a 5 x 5 tap over each K-wire after dilating the musculature.  The dilators were then removed, then we placed 65 x 45 mm pedicle screws at L3, L4, and L5 pedicles over the K-wires on the left.  I then used  fascial knife to open the fascia and then passed a 75-mm rod through the towers from superior to inferior and then locked these in position with locking caps and antitorque device and then checked this with AP and lateral fluoroscopy.  I then went to the patient's left side and did the exact same thing.  We tapped over the K-wires through the dilator, removed the dilator and placed 6.5 x 40 mm pedicle screws on the patient's left side at L3 and L4 and 45-mm screw at L5.  We then used the fascial knife again and then passed our 75-mm rod from cranial to caudal and then locked this in position with locking caps and antitorque device.  We then checked our final construct with AP and lateral fluoroscopy.  It looked quite good.  We had reestablished disk space height, therefore foraminal cross-sectional area and ended upon indirect decompression in order to relieve her radicular pain.  The wounds were then closed in layers of 0 Vicryl in the fascia, 2-0 Vicryl in the subcutaneous tissues, 3-0 Vicryl in the subcuticular tissues.  The skin was closed with Dermabond.  The patient was then awakened from general  anesthesia and transferred to the recovery room in stable condition.  At the end of the procedure, all sponge, needle and instrument counts were correct.     Tia Alert, MD     DSJ/MEDQ  D:  06/21/2010  T:  06/22/2010  Job:  [id number]  Electronically Signed by Marikay Alar MD on 07/17/2010 09:46:14 AM

## 2010-07-18 NOTE — Discharge Summary (Signed)
  NAMEMAILYNN, EVERLY               ACCOUNT NO.:  [id number]  MEDICAL RECORD NO.:  [id number]  LOCATION:  3009                         FACILITY:  [facility]  PHYSICIAN:  Tia Alert, MD     DATE OF BIRTH:  1945/10/21  DATE OF ADMISSION:  06/21/2010 DATE OF DISCHARGE:  06/24/2010                              DISCHARGE SUMMARY   PROCEDURE:  Anterolateral retroperitoneal interbody fusion L3-4, L4-5 with percutaneous pedicle screws.  HOSPITAL COURSE:  The patient was admitted on Jun 21, 2010, and taken to the operating room where she underwent an XLIF at L3-4 and L4-5 followed by percutaneous pedicle screw placement.  The patient did very well following her surgery, was taken to the floor in stable condition.  For details of the operative procedure, please see the dictated operative note.  The patient's hospital course was routine.  There were no complications.  She remained afebrile with stable vital signs.  She tolerated regular diet.  Her pain was well controlled with a Dilaudid PCA for the first couple of days, has been changed to oral medications, which worked well for her.  She had resolution of her leg pain.  She had appropriate back soreness.  She was discharged home in stable condition on postop day #3 with plans to follow up in 2 weeks.  FINAL DIAGNOSIS:  XLIF, L3-4 and L45.     Tia Alert, MD     DSJ/MEDQ  D:  07/17/2010  T:  07/18/2010  Job:  [id number]  Electronically Signed by Marikay Alar MD on 07/18/2010 04:36:30 PM

## 2010-09-17 ENCOUNTER — Ambulatory Visit
Admission: RE | Admit: 2010-09-17 | Discharge: 2010-09-17 | Disposition: A | Discharge status: Home / Self Care | Payer: BC Managed Care – PPO | Source: Ambulatory Visit | Attending: Neurological Surgery | Admitting: Neurological Surgery

## 2010-09-17 ENCOUNTER — Other Ambulatory Visit: Payer: Self-pay | Admitting: Neurological Surgery

## 2010-09-17 DIAGNOSIS — M4310 Spondylolisthesis, site unspecified: Secondary | ICD-10-CM

## 2010-09-17 DIAGNOSIS — M48061 Spinal stenosis, lumbar region without neurogenic claudication: Secondary | ICD-10-CM

## 2010-10-26 LAB — URINALYSIS, ROUTINE W REFLEX MICROSCOPIC
Ketones, ur: NEGATIVE
Protein, ur: NEGATIVE
Urobilinogen, UA: 0.2

## 2010-10-26 LAB — CBC
HCT: 34.7 — ABNORMAL LOW
HCT: 38.9
Hemoglobin: 11.9 — ABNORMAL LOW
Hemoglobin: 13.3
MCHC: 34.1
MCHC: 34.7
MCV: 93.2
MCV: 93.3
Platelets: 258
Platelets: 259
RBC: 3.71 — ABNORMAL LOW
RBC: 3.95
RBC: 4.2
RDW: 12.7
RDW: 12.7
RDW: 12.9
WBC: 7.8
WBC: 8.4

## 2010-10-26 LAB — PROTIME-INR
INR: 0.9
Prothrombin Time: 12.6

## 2010-10-26 LAB — POCT CARDIAC MARKERS: Operator id: 282201

## 2010-10-26 LAB — DIFFERENTIAL
Basophils Absolute: 0
Basophils Absolute: 0
Eosinophils Absolute: 0.1
Eosinophils Relative: 0
Eosinophils Relative: 1
Lymphocytes Relative: 26
Lymphocytes Relative: 38
Lymphs Abs: 3.2
Monocytes Absolute: 0.4
Monocytes Relative: 4
Neutro Abs: 6.9
Neutrophils Relative %: 53

## 2010-10-26 LAB — COMPREHENSIVE METABOLIC PANEL
AST: 17
CO2: 29
Calcium: 9.6
Creatinine, Ser: 0.66
GFR calc Af Amer: >60
GFR calc non Af Amer: >60
Sodium: 138
Total Protein: 7

## 2010-10-26 LAB — BASIC METABOLIC PANEL
BUN: 9
CO2: 30
Calcium: 9.3
Calcium: 9.3
Creatinine, Ser: 0.62
GFR calc Af Amer: >60
GFR calc non Af Amer: >60
Glucose, Bld: 92
Sodium: 141

## 2010-10-26 LAB — CARDIAC PANEL(CRET KIN+CKTOT+MB+TROPI)
CK, MB: 1.2
CK, MB: 1.5
Relative Index: 1.1
Relative Index: INVALID
Total CK: 114
Total CK: 82
Total CK: 98
Troponin I: 0.02

## 2010-10-26 LAB — POCT I-STAT CREATININE
Creatinine, Ser: 0.7
Operator id: 282201

## 2010-10-26 LAB — I-STAT 8, (EC8 V) (CONVERTED LAB)
Chloride: 104
Glucose, Bld: 186 — ABNORMAL HIGH
HCT: 42
Potassium: 3.9
pCO2, Ven: 38 — ABNORMAL LOW
pH, Ven: 7.418 — ABNORMAL HIGH

## 2010-10-26 LAB — URINE MICROSCOPIC-ADD ON

## 2010-10-26 LAB — URINE CULTURE: Colony Count: 90000

## 2010-10-26 LAB — LIPID PANEL
Cholesterol: 217 — ABNORMAL HIGH
LDL Cholesterol: 155 — ABNORMAL HIGH
Total CHOL/HDL Ratio: 6.6
Triglycerides: 144

## 2010-10-26 LAB — APTT: aPTT: 25

## 2010-11-02 LAB — CBC
HCT: 38.9
Hemoglobin: 13.1
MCHC: 33.6
MCV: 93.9
RBC: 4.14
RDW: 13

## 2010-11-02 LAB — BASIC METABOLIC PANEL
CO2: 31
Chloride: 104
GFR calc Af Amer: >60
Glucose, Bld: 104 — ABNORMAL HIGH
Potassium: 4.1
Sodium: 143

## 2010-11-02 LAB — GLUCOSE, CAPILLARY

## 2012-07-29 ENCOUNTER — Other Ambulatory Visit: Payer: Self-pay | Admitting: Neurological Surgery

## 2012-07-29 DIAGNOSIS — M549 Dorsalgia, unspecified: Secondary | ICD-10-CM

## 2012-07-29 DIAGNOSIS — M546 Pain in thoracic spine: Secondary | ICD-10-CM

## 2012-08-11 ENCOUNTER — Other Ambulatory Visit: Payer: BC Managed Care – PPO

## 2013-02-11 ENCOUNTER — Other Ambulatory Visit: Payer: Self-pay | Admitting: Neurological Surgery

## 2013-02-11 DIAGNOSIS — M549 Dorsalgia, unspecified: Secondary | ICD-10-CM

## 2013-02-17 ENCOUNTER — Ambulatory Visit
Admission: RE | Admit: 2013-02-17 | Discharge: 2013-02-17 | Disposition: A | Discharge status: Home / Self Care | Payer: 59 | Source: Ambulatory Visit | Attending: Neurological Surgery | Admitting: Neurological Surgery

## 2013-02-17 DIAGNOSIS — M549 Dorsalgia, unspecified: Secondary | ICD-10-CM

## 2014-08-09 ENCOUNTER — Other Ambulatory Visit: Payer: Self-pay | Admitting: Neurological Surgery

## 2014-11-23 ENCOUNTER — Inpatient Hospital Stay (HOSPITAL_COMMUNITY): Admit: 2014-11-23 | Payer: BC Managed Care – PPO | Admitting: Neurological Surgery

## 2014-11-23 ENCOUNTER — Encounter (HOSPITAL_COMMUNITY): Payer: Self-pay

## 2014-11-23 SURGERY — FOR MAXIMUM ACCESS (MAS) POSTERIOR LUMBAR INTERBODY FUSION (PLIF) 1 LEVEL
Anesthesia: General | Site: Back

## 2015-05-02 ENCOUNTER — Other Ambulatory Visit: Payer: Self-pay | Admitting: Neurological Surgery

## 2015-07-12 NOTE — Pre-Procedure Instructions (Signed)
[name]  07/12/2015     No Pharmacies Listed   Your procedure is scheduled on Friday, June 16  Report to [facility] Admitting at 6:15 A.M.   Call this number if you have problems the morning of surgery:  [phone]   Remember:  Do not eat food or drink liquids after midnight.   Take these medicines the morning of surgery with A SIP OF WATER: Hydrocodone-acetaminophen (Norco/Vicodin) if needed, Ranitidine (Zantac).   WHAT DO I DO ABOUT MY DIABETES MEDICATION?   Marland Kitchen Do not take oral diabetes medicines (pills) the morning of surgery.  Do NOT take Actos the morning of surgery.    7 days prior to surgery STOP taking any Aspirin, Aleve, Naproxen, Ibuprofen, Motrin, Advil, Goody's, BC's, all herbal medications, fish oil, and all vitamins    Do not wear jewelry, make-up or nail polish.  Do not wear lotions, powders, or perfumes.  You may not wear deodorant.  Do not shave 48 hours prior to surgery.    Do not bring valuables to the hospital.   [facility] is not responsible for any belongings or valuables.  Contacts, dentures or bridgework may not be worn into surgery.  Leave your suitcase in the car.  After surgery it may be brought to your room.  For patients admitted to the hospital, discharge time will be determined by your treatment team.  Patients discharged the day of surgery will not be allowed to drive home.   Special instructions: review preparing for surgery handout  Please read over the following fact sheets that you were given. MRSA Information      How to Manage Your Diabetes Before and After Surgery  Why is it important to control my blood sugar before and after surgery? . Improving blood sugar levels before and after surgery helps healing and can limit problems. . A way of improving blood sugar control is eating a healthy diet by: o  Eating less sugar and carbohydrates o  Increasing activity/exercise o  Talking with your doctor about  reaching your blood sugar goals . High blood sugars (greater than 180 mg/dL) can raise your risk of infections and slow your recovery, so you will need to focus on controlling your diabetes during the weeks before surgery. . Make sure that the doctor who takes care of your diabetes knows about your planned surgery including the date and location.  How do I manage my blood sugar before surgery? . Check your blood sugar at least 4 times a day, starting 2 days before surgery, to make sure that the level is not too high or low. o Check your blood sugar the morning of your surgery when you wake up and every 2 hours until you get to the Short Stay unit. . If your blood sugar is less than 70 mg/dL, you will need to treat for low blood sugar: o Do not take insulin. o Treat a low blood sugar (less than 70 mg/dL) with  cup of clear juice (cranberry or apple), 4 glucose tablets, OR glucose gel. o Recheck blood sugar in 15 minutes after treatment (to make sure it is greater than 70 mg/dL). If your blood sugar is not greater than 70 mg/dL on recheck, call [phone] for further instructions. . Report your blood sugar to the short stay nurse when you get to Short Stay.  . If you are admitted to the hospital after surgery: o Your blood sugar will be checked by the staff  and you will probably be given insulin after surgery (instead of oral diabetes medicines) to make sure you have good blood sugar levels. o The goal for blood sugar control after surgery is 80-180 mg/dL.       [facility]- Preparing For Surgery  Before surgery, you can play an important role. Because skin is not sterile, your skin needs to be as free of germs as possible. You can reduce the number of germs on your skin by washing with CHG (chlorahexidine gluconate) Soap before surgery.  CHG is an antiseptic cleaner which kills germs and bonds with the skin to continue killing germs even after washing.  Please do not use if you have an  allergy to CHG or antibacterial soaps. If your skin becomes reddened/irritated stop using the CHG.  Do not shave (including legs and underarms) for at least 48 hours prior to first CHG shower. It is OK to shave your face.  Please follow these instructions carefully.   1. Shower the NIGHT BEFORE SURGERY and the MORNING OF SURGERY with CHG.   2. If you chose to wash your hair, wash your hair first as usual with your normal shampoo.  3. After you shampoo, rinse your hair and body thoroughly to remove the shampoo.  4. Use CHG as you would any other liquid soap. You can apply CHG directly to the skin and wash gently with a scrungie or a clean washcloth.   5. Apply the CHG Soap to your body ONLY FROM THE NECK DOWN.  Do not use on open wounds or open sores. Avoid contact with your eyes, ears, mouth and genitals (private parts). Wash genitals (private parts) with your normal soap.  6. Wash thoroughly, paying special attention to the area where your surgery will be performed.  7. Thoroughly rinse your body with warm water from the neck down.  8. DO NOT shower/wash with your normal soap after using and rinsing off the CHG Soap.  9. Pat yourself dry with a CLEAN TOWEL.   10. Wear CLEAN PAJAMAS   11. Place CLEAN SHEETS on your bed the night of your first shower and DO NOT SLEEP WITH PETS.    Day of Surgery: Do not apply any deodorants/lotions. Please wear clean clothes to the hospital/surgery center.

## 2015-07-13 ENCOUNTER — Encounter (HOSPITAL_COMMUNITY): Payer: Self-pay

## 2015-07-13 ENCOUNTER — Other Ambulatory Visit: Payer: Self-pay | Admitting: Neurological Surgery

## 2015-07-13 ENCOUNTER — Encounter (HOSPITAL_COMMUNITY)
Admission: RE | Admit: 2015-07-13 | Discharge: 2015-07-13 | Disposition: A | Discharge status: Home / Self Care | Payer: Medicare Other | Source: Ambulatory Visit | Attending: Neurological Surgery | Admitting: Neurological Surgery

## 2015-07-13 DIAGNOSIS — Z01812 Encounter for preprocedural laboratory examination: Secondary | ICD-10-CM | POA: Diagnosis not present

## 2015-07-13 DIAGNOSIS — Z01818 Encounter for other preprocedural examination: Secondary | ICD-10-CM | POA: Insufficient documentation

## 2015-07-13 DIAGNOSIS — Z0183 Encounter for blood typing: Secondary | ICD-10-CM | POA: Diagnosis not present

## 2015-07-13 DIAGNOSIS — M4310 Spondylolisthesis, site unspecified: Secondary | ICD-10-CM | POA: Diagnosis not present

## 2015-07-13 HISTORY — DX: Transient cerebral ischemic attack, unspecified: G45.9

## 2015-07-13 HISTORY — DX: Type 2 diabetes mellitus without complications: E11.9

## 2015-07-13 HISTORY — DX: Presence of spectacles and contact lenses: Z97.3

## 2015-07-13 HISTORY — DX: Personal history of other diseases of the respiratory system: Z87.09

## 2015-07-13 HISTORY — DX: Rheumatoid arthritis, unspecified: M06.9

## 2015-07-13 HISTORY — DX: Personal history of pneumonia (recurrent): Z87.01

## 2015-07-13 LAB — CBC WITH DIFFERENTIAL/PLATELET
BASOS PCT: 0 %
Basophils Absolute: 0 10*3/uL (ref 0.0–0.1)
EOS PCT: 1 %
Eosinophils Absolute: 0.1 10*3/uL (ref 0.0–0.7)
HEMATOCRIT: 38 % (ref 36.0–46.0)
HEMOGLOBIN: 11.9 g/dL — AB (ref 12.0–15.0)
Lymphocytes Relative: 46 %
Lymphs Abs: 3.1 10*3/uL (ref 0.7–4.0)
MCH: 32.2 pg (ref 26.0–34.0)
MCHC: 31.3 g/dL (ref 30.0–36.0)
MCV: 102.7 fL — AB (ref 78.0–100.0)
MONO ABS: 0.5 10*3/uL (ref 0.1–1.0)
MONOS PCT: 7 %
NEUTROS ABS: 3 10*3/uL (ref 1.7–7.7)
Neutrophils Relative %: 46 %
Platelets: 256 10*3/uL (ref 150–400)
RBC: 3.7 MIL/uL — AB (ref 3.87–5.11)
RDW: 14.8 % (ref 11.5–15.5)
WBC: 6.6 10*3/uL (ref 4.0–10.5)

## 2015-07-13 LAB — BASIC METABOLIC PANEL
ANION GAP: 5 (ref 5–15)
BUN: 20 mg/dL (ref 6–20)
CALCIUM: 10.1 mg/dL (ref 8.9–10.3)
CO2: 27 mmol/L (ref 22–32)
Chloride: 108 mmol/L (ref 101–111)
Creatinine, Ser: 0.79 mg/dL (ref 0.44–1.00)
GFR calc Af Amer: >60 mL/min (ref >60)
GFR calc non Af Amer: >60 mL/min (ref >60)
GLUCOSE: 107 mg/dL — AB (ref 65–99)
Potassium: 3.8 mmol/L (ref 3.5–5.1)
Sodium: 140 mmol/L (ref 135–145)

## 2015-07-13 LAB — PROTIME-INR
INR: 1.04 (ref 0.00–1.49)
PROTHROMBIN TIME: 13.8 s (ref 11.6–15.2)

## 2015-07-13 LAB — GLUCOSE, CAPILLARY: GLUCOSE-CAPILLARY: 135 mg/dL — AB (ref 65–99)

## 2015-07-13 LAB — SURGICAL PCR SCREEN
MRSA, PCR: NEGATIVE
STAPHYLOCOCCUS AUREUS: NEGATIVE

## 2015-07-13 NOTE — Progress Notes (Addendum)
PCP - Dr. Juluis Rainier Cardiologist - Dr. Sherril Croon at [facility]  EKG - requested from [facility] Cardiologist CXR - 07/13/15  Echo- 02/2007 Stress test - scheduled for 07/14/15 - requested Cardiac Cath - denies  Patient denies chest pain and shortness of breath at PAT appointment.  Patient states that she is having a stress test completed on 07/14/15 and will receive cardiac and medical clearance if stress test is ok.    Patient states that she checks her blood sugar twice a week and that her fasting glucose is in the low 100's.

## 2015-07-13 NOTE — Progress Notes (Signed)
Patient allergic to Penicillins but has Ancef 2g ordered.  Left message with Erie Noe at Dr. Yetta Barre office.

## 2015-07-14 LAB — HEMOGLOBIN A1C
Hgb A1c MFr Bld: 6.1 % — ABNORMAL HIGH (ref 4.8–5.6)
Mean Plasma Glucose: 128 mg/dL

## 2015-07-20 MED ORDER — VANCOMYCIN HCL IN DEXTROSE 1-5 GM/200ML-% IV SOLN
1000.0000 mg | INTRAVENOUS | Status: AC
  Administered 2015-07-21: 1000 mg via INTRAVENOUS
  Filled 2015-07-20: qty 200

## 2015-07-21 ENCOUNTER — Inpatient Hospital Stay (HOSPITAL_COMMUNITY): Payer: Medicare Other | Admitting: Anesthesiology

## 2015-07-21 ENCOUNTER — Encounter (HOSPITAL_COMMUNITY): Payer: Self-pay | Admitting: Surgery

## 2015-07-21 ENCOUNTER — Encounter (HOSPITAL_COMMUNITY)
Admission: RE | Disposition: A | Discharge status: Home / Self Care | Payer: Self-pay | Source: Ambulatory Visit | Attending: Neurological Surgery

## 2015-07-21 ENCOUNTER — Inpatient Hospital Stay (HOSPITAL_COMMUNITY)
Admission: RE | Admit: 2015-07-21 | Discharge: 2015-07-23 | DRG: 460 | Disposition: A | Discharge status: Home / Self Care | Payer: Medicare Other | Source: Ambulatory Visit | Attending: Neurological Surgery | Admitting: Neurological Surgery

## 2015-07-21 ENCOUNTER — Inpatient Hospital Stay (HOSPITAL_COMMUNITY): Payer: Medicare Other

## 2015-07-21 DIAGNOSIS — M5136 Other intervertebral disc degeneration, lumbar region: Secondary | ICD-10-CM | POA: Diagnosis present

## 2015-07-21 DIAGNOSIS — Z8673 Personal history of transient ischemic attack (TIA), and cerebral infarction without residual deficits: Secondary | ICD-10-CM | POA: Diagnosis not present

## 2015-07-21 DIAGNOSIS — Z880 Allergy status to penicillin: Secondary | ICD-10-CM

## 2015-07-21 DIAGNOSIS — E119 Type 2 diabetes mellitus without complications: Secondary | ICD-10-CM | POA: Diagnosis present

## 2015-07-21 DIAGNOSIS — M069 Rheumatoid arthritis, unspecified: Secondary | ICD-10-CM | POA: Diagnosis present

## 2015-07-21 DIAGNOSIS — Z79899 Other long term (current) drug therapy: Secondary | ICD-10-CM | POA: Diagnosis not present

## 2015-07-21 DIAGNOSIS — Z419 Encounter for procedure for purposes other than remedying health state, unspecified: Secondary | ICD-10-CM

## 2015-07-21 DIAGNOSIS — M4806 Spinal stenosis, lumbar region: Secondary | ICD-10-CM | POA: Diagnosis present

## 2015-07-21 DIAGNOSIS — Z981 Arthrodesis status: Secondary | ICD-10-CM

## 2015-07-21 DIAGNOSIS — M545 Low back pain: Secondary | ICD-10-CM | POA: Diagnosis present

## 2015-07-21 LAB — GLUCOSE, CAPILLARY
GLUCOSE-CAPILLARY: 132 mg/dL — AB (ref 65–99)
GLUCOSE-CAPILLARY: 160 mg/dL — AB (ref 65–99)
Glucose-Capillary: 155 mg/dL — ABNORMAL HIGH (ref 65–99)

## 2015-07-21 LAB — TYPE AND SCREEN
ABO/RH(D): O NEG
ABO/RH(D): O NEG
ANTIBODY SCREEN: NEGATIVE
ANTIBODY SCREEN: NEGATIVE

## 2015-07-21 SURGERY — POSTERIOR LUMBAR FUSION 1 WITH HARDWARE REMOVAL
Anesthesia: General | Site: Spine Lumbar

## 2015-07-21 MED ORDER — ONDANSETRON HCL 4 MG/2ML IJ SOLN
4.0000 mg | INTRAMUSCULAR | Status: DC | PRN
  Filled 2015-07-21: qty 2

## 2015-07-21 MED ORDER — SODIUM CHLORIDE 0.9 % IR SOLN
Status: DC | PRN
  Administered 2015-07-21: 08:00:00

## 2015-07-21 MED ORDER — FOLIC ACID 0.5 MG HALF TAB
1500.0000 ug | ORAL_TABLET | Freq: Every day | ORAL | Status: DC
  Administered 2015-07-22 – 2015-07-23 (×2): 1.5 mg via ORAL
  Filled 2015-07-21 (×3): qty 3

## 2015-07-21 MED ORDER — DEXAMETHASONE SODIUM PHOSPHATE 10 MG/ML IJ SOLN
10.0000 mg | INTRAMUSCULAR | Status: AC
  Administered 2015-07-21: 10 mg via INTRAVENOUS
  Filled 2015-07-21: qty 1

## 2015-07-21 MED ORDER — EZETIMIBE 10 MG PO TABS
10.0000 mg | ORAL_TABLET | Freq: Every day | ORAL | Status: DC
Start: 2015-07-21 — End: 2015-07-23
  Administered 2015-07-21 – 2015-07-23 (×3): 10 mg via ORAL
  Filled 2015-07-21 (×3): qty 1

## 2015-07-21 MED ORDER — 0.9 % SODIUM CHLORIDE (POUR BTL) OPTIME
TOPICAL | Status: DC | PRN
  Administered 2015-07-21: 1000 mL

## 2015-07-21 MED ORDER — HYDROMORPHONE HCL 1 MG/ML IJ SOLN
0.2500 mg | INTRAMUSCULAR | Status: DC | PRN
  Administered 2015-07-21 (×4): 0.5 mg via INTRAVENOUS

## 2015-07-21 MED ORDER — ACETAMINOPHEN 650 MG RE SUPP: 650.0000 mg | RECTAL | Status: DC | PRN

## 2015-07-21 MED ORDER — VANCOMYCIN HCL 1000 MG IV SOLR
INTRAVENOUS | Status: AC
  Filled 2015-07-21: qty 1000

## 2015-07-21 MED ORDER — SODIUM CHLORIDE 0.9 % IV SOLN
10.0000 mg | INTRAVENOUS | Status: DC | PRN
  Administered 2015-07-21: 25 ug/min via INTRAVENOUS

## 2015-07-21 MED ORDER — METHOCARBAMOL 1000 MG/10ML IJ SOLN
500.0000 mg | Freq: Four times a day (QID) | INTRAVENOUS | Status: DC | PRN
  Administered 2015-07-21: 500 mg via INTRAVENOUS
  Filled 2015-07-21 (×2): qty 5

## 2015-07-21 MED ORDER — FENTANYL CITRATE (PF) 100 MCG/2ML IJ SOLN
INTRAMUSCULAR | Status: DC | PRN
  Administered 2015-07-21 (×3): 50 ug via INTRAVENOUS
  Administered 2015-07-21: 100 ug via INTRAVENOUS

## 2015-07-21 MED ORDER — PROMETHAZINE HCL 25 MG/ML IJ SOLN: 6.2500 mg | INTRAMUSCULAR | Status: DC | PRN

## 2015-07-21 MED ORDER — PIOGLITAZONE HCL 30 MG PO TABS
30.0000 mg | ORAL_TABLET | Freq: Every day | ORAL | Status: DC
  Administered 2015-07-22 – 2015-07-23 (×2): 30 mg via ORAL
  Filled 2015-07-21 (×3): qty 1

## 2015-07-21 MED ORDER — SODIUM CHLORIDE 0.9% FLUSH
3.0000 mL | Freq: Two times a day (BID) | INTRAVENOUS | Status: DC
  Administered 2015-07-21 – 2015-07-23 (×3): 3 mL via INTRAVENOUS

## 2015-07-21 MED ORDER — THROMBIN 20000 UNITS EX SOLR
CUTANEOUS | Status: DC | PRN
  Administered 2015-07-21: 08:00:00 via TOPICAL

## 2015-07-21 MED ORDER — MENTHOL 3 MG MT LOZG
1.0000 | LOZENGE | OROMUCOSAL | Status: DC | PRN
Start: 2015-07-21 — End: 2015-07-23

## 2015-07-21 MED ORDER — LIDOCAINE 2% (20 MG/ML) 5 ML SYRINGE
INTRAMUSCULAR | Status: AC
  Filled 2015-07-21: qty 5

## 2015-07-21 MED ORDER — FENTANYL CITRATE (PF) 250 MCG/5ML IJ SOLN
INTRAMUSCULAR | Status: AC
  Filled 2015-07-21: qty 5

## 2015-07-21 MED ORDER — VANCOMYCIN HCL IN DEXTROSE 1-5 GM/200ML-% IV SOLN
1000.0000 mg | Freq: Two times a day (BID) | INTRAVENOUS | Status: DC
  Administered 2015-07-21 – 2015-07-23 (×4): 1000 mg via INTRAVENOUS
  Filled 2015-07-21 (×5): qty 200

## 2015-07-21 MED ORDER — CELECOXIB 200 MG PO CAPS
200.0000 mg | ORAL_CAPSULE | Freq: Two times a day (BID) | ORAL | Status: DC
  Administered 2015-07-21 – 2015-07-23 (×5): 200 mg via ORAL
  Filled 2015-07-21 (×5): qty 1

## 2015-07-21 MED ORDER — LIDOCAINE HCL (CARDIAC) 20 MG/ML IV SOLN: INTRAVENOUS | Status: DC | PRN

## 2015-07-21 MED ORDER — METHOCARBAMOL 500 MG PO TABS
500.0000 mg | ORAL_TABLET | Freq: Four times a day (QID) | ORAL | Status: DC | PRN
  Administered 2015-07-21 – 2015-07-22 (×3): 500 mg via ORAL
  Filled 2015-07-21 (×4): qty 1

## 2015-07-21 MED ORDER — VANCOMYCIN HCL 1000 MG IV SOLR
INTRAVENOUS | Status: DC | PRN
  Administered 2015-07-21: 1000 mg

## 2015-07-21 MED ORDER — ONDANSETRON HCL 4 MG/2ML IJ SOLN
INTRAMUSCULAR | Status: DC | PRN
  Administered 2015-07-21: 4 mg via INTRAVENOUS

## 2015-07-21 MED ORDER — GLYCOPYRROLATE 0.2 MG/ML IJ SOLN
INTRAMUSCULAR | Status: DC | PRN
  Administered 2015-07-21: 0.2 mg via INTRAVENOUS

## 2015-07-21 MED ORDER — PHENOL 1.4 % MT LIQD: 1.0000 | OROMUCOSAL | Status: DC | PRN

## 2015-07-21 MED ORDER — ONDANSETRON HCL 4 MG/2ML IJ SOLN
INTRAMUSCULAR | Status: AC
  Filled 2015-07-21: qty 2

## 2015-07-21 MED ORDER — OXYCODONE-ACETAMINOPHEN 5-325 MG PO TABS
1.0000 | ORAL_TABLET | ORAL | Status: DC | PRN
  Administered 2015-07-21 – 2015-07-22 (×5): 2 via ORAL
  Administered 2015-07-23: 1 via ORAL
  Administered 2015-07-23: 2 via ORAL
  Filled 2015-07-21 (×3): qty 2
  Filled 2015-07-21: qty 1
  Filled 2015-07-21 (×3): qty 2

## 2015-07-21 MED ORDER — ARTIFICIAL TEARS OP OINT
TOPICAL_OINTMENT | OPHTHALMIC | Status: AC
  Filled 2015-07-21: qty 3.5

## 2015-07-21 MED ORDER — SUGAMMADEX SODIUM 200 MG/2ML IV SOLN
INTRAVENOUS | Status: AC
  Filled 2015-07-21: qty 2

## 2015-07-21 MED ORDER — MIDAZOLAM HCL 2 MG/2ML IJ SOLN
INTRAMUSCULAR | Status: AC
  Filled 2015-07-21: qty 2

## 2015-07-21 MED ORDER — MIDAZOLAM HCL 5 MG/5ML IJ SOLN
INTRAMUSCULAR | Status: DC | PRN
  Administered 2015-07-21: 2 mg via INTRAVENOUS

## 2015-07-21 MED ORDER — HYDROMORPHONE HCL 1 MG/ML IJ SOLN
INTRAMUSCULAR | Status: AC
  Filled 2015-07-21: qty 1

## 2015-07-21 MED ORDER — LIDOCAINE 2% (20 MG/ML) 5 ML SYRINGE
INTRAMUSCULAR | Status: DC | PRN
  Administered 2015-07-21: 40 mg via INTRAVENOUS

## 2015-07-21 MED ORDER — SODIUM CHLORIDE 0.9% FLUSH: 3.0000 mL | INTRAVENOUS | Status: DC | PRN

## 2015-07-21 MED ORDER — GLYCOPYRROLATE 0.2 MG/ML IV SOSY
PREFILLED_SYRINGE | INTRAVENOUS | Status: AC
  Filled 2015-07-21: qty 3

## 2015-07-21 MED ORDER — POTASSIUM CHLORIDE IN NACL 20-0.9 MEQ/L-% IV SOLN
INTRAVENOUS | Status: DC
  Administered 2015-07-21: 16:00:00 via INTRAVENOUS
  Filled 2015-07-21 (×5): qty 1000

## 2015-07-21 MED ORDER — MORPHINE SULFATE (PF) 2 MG/ML IV SOLN
1.0000 mg | INTRAVENOUS | Status: DC | PRN
  Administered 2015-07-21: 4 mg via INTRAVENOUS
  Administered 2015-07-21 – 2015-07-22 (×2): 2 mg via INTRAVENOUS
  Administered 2015-07-22: 4 mg via INTRAVENOUS
  Filled 2015-07-21: qty 1
  Filled 2015-07-21 (×2): qty 2
  Filled 2015-07-21: qty 1

## 2015-07-21 MED ORDER — SUGAMMADEX SODIUM 200 MG/2ML IV SOLN
INTRAVENOUS | Status: DC | PRN
  Administered 2015-07-21: 200 mg via INTRAVENOUS

## 2015-07-21 MED ORDER — BUPIVACAINE HCL (PF) 0.25 % IJ SOLN
INTRAMUSCULAR | Status: DC | PRN
  Administered 2015-07-21: 3 mL

## 2015-07-21 MED ORDER — THROMBIN 5000 UNITS EX SOLR
OROMUCOSAL | Status: DC | PRN
  Administered 2015-07-21: 08:00:00 via TOPICAL

## 2015-07-21 MED ORDER — PROPOFOL 10 MG/ML IV BOLUS
INTRAVENOUS | Status: DC | PRN
  Administered 2015-07-21: 170 mg via INTRAVENOUS

## 2015-07-21 MED ORDER — FAMOTIDINE 20 MG PO TABS
20.0000 mg | ORAL_TABLET | Freq: Every day | ORAL | Status: DC
  Administered 2015-07-22 – 2015-07-23 (×2): 20 mg via ORAL
  Filled 2015-07-21 (×2): qty 1

## 2015-07-21 MED ORDER — ROCURONIUM BROMIDE 100 MG/10ML IV SOLN
INTRAVENOUS | Status: DC | PRN
  Administered 2015-07-21: 20 mg via INTRAVENOUS
  Administered 2015-07-21: 50 mg via INTRAVENOUS

## 2015-07-21 MED ORDER — LACTATED RINGERS IV SOLN
INTRAVENOUS | Status: DC
  Administered 2015-07-21 (×2): via INTRAVENOUS

## 2015-07-21 MED ORDER — ACETAMINOPHEN 325 MG PO TABS: 650.0000 mg | ORAL_TABLET | ORAL | Status: DC | PRN

## 2015-07-21 MED ORDER — FENOFIBRATE 54 MG PO TABS
54.0000 mg | ORAL_TABLET | Freq: Every day | ORAL | Status: DC
  Administered 2015-07-22 – 2015-07-23 (×2): 54 mg via ORAL
  Filled 2015-07-21 (×3): qty 1

## 2015-07-21 MED ORDER — PROPOFOL 10 MG/ML IV BOLUS
INTRAVENOUS | Status: AC
  Filled 2015-07-21: qty 20

## 2015-07-21 MED ORDER — SODIUM CHLORIDE 0.9 % IV SOLN: 250.0000 mL | INTRAVENOUS | Status: DC

## 2015-07-21 SURGICAL SUPPLY — 66 items
ADH SKN CLS APL DERMABOND .7 (GAUZE/BANDAGES/DRESSINGS) ×1
APL SKNCLS STERI-STRIP NONHPOA (GAUZE/BANDAGES/DRESSINGS) ×1
BAG DECANTER FOR FLEXI CONT (MISCELLANEOUS) ×3 IMPLANT
BENZOIN TINCTURE PRP APPL 2/3 (GAUZE/BANDAGES/DRESSINGS) ×3 IMPLANT
BLADE CLIPPER SURG (BLADE) IMPLANT
BONE MATRIX OSTEOCEL PRO SM (Bone Implant) ×2 IMPLANT
BUR MATCHSTICK NEURO 3.0 LAGG (BURR) ×3 IMPLANT
CAGE COROENT PLIF 10X28-8 LUMB (Cage) ×4 IMPLANT
CANISTER SUCT 3000ML PPV (MISCELLANEOUS) ×3 IMPLANT
CLOSURE WOUND 1/2 X4 (GAUZE/BANDAGES/DRESSINGS) ×1
CONT SPEC 4OZ CLIKSEAL STRL BL (MISCELLANEOUS) ×3 IMPLANT
COVER BACK TABLE 60X90IN (DRAPES) ×3 IMPLANT
DERMABOND ADVANCED (GAUZE/BANDAGES/DRESSINGS) ×2
DERMABOND ADVANCED .7 DNX12 (GAUZE/BANDAGES/DRESSINGS) IMPLANT
DRAPE C-ARM 42X72 X-RAY (DRAPES) ×6 IMPLANT
DRAPE LAPAROTOMY 100X72X124 (DRAPES) ×3 IMPLANT
DRAPE POUCH INSTRU U-SHP 10X18 (DRAPES) ×3 IMPLANT
DRAPE SURG 17X23 STRL (DRAPES) ×3 IMPLANT
DRSG OPSITE POSTOP 4X6 (GAUZE/BANDAGES/DRESSINGS) ×2 IMPLANT
DURAPREP 26ML APPLICATOR (WOUND CARE) ×3 IMPLANT
ELECT REM PT RETURN 9FT ADLT (ELECTROSURGICAL) ×3
ELECTRODE REM PT RTRN 9FT ADLT (ELECTROSURGICAL) ×1 IMPLANT
EVACUATOR 1/8 PVC DRAIN (DRAIN) ×3 IMPLANT
GAUZE SPONGE 4X4 16PLY XRAY LF (GAUZE/BANDAGES/DRESSINGS) IMPLANT
GLOVE BIO SURGEON STRL SZ8 (GLOVE) ×6 IMPLANT
GLOVE BIOGEL PI IND STRL 6.5 (GLOVE) IMPLANT
GLOVE BIOGEL PI IND STRL 7.5 (GLOVE) IMPLANT
GLOVE BIOGEL PI INDICATOR 6.5 (GLOVE) ×2
GLOVE BIOGEL PI INDICATOR 7.5 (GLOVE) ×6
GLOVE SS BIOGEL STRL SZ 7 (GLOVE) IMPLANT
GLOVE SUPERSENSE BIOGEL SZ 7 (GLOVE) ×2
GLOVE SURG SS PI 6.5 STRL IVOR (GLOVE) ×4 IMPLANT
GOWN STRL REUS W/ TWL LRG LVL3 (GOWN DISPOSABLE) IMPLANT
GOWN STRL REUS W/ TWL XL LVL3 (GOWN DISPOSABLE) ×2 IMPLANT
GOWN STRL REUS W/TWL 2XL LVL3 (GOWN DISPOSABLE) IMPLANT
GOWN STRL REUS W/TWL LRG LVL3 (GOWN DISPOSABLE) ×12
GOWN STRL REUS W/TWL XL LVL3 (GOWN DISPOSABLE) ×6
HEMOSTAT POWDER KIT SURGIFOAM (HEMOSTASIS) ×2 IMPLANT
KIT BASIN OR (CUSTOM PROCEDURE TRAY) ×3 IMPLANT
KIT INFUSE X SMALL 1.4CC (Orthopedic Implant) ×2 IMPLANT
KIT ROOM TURNOVER OR (KITS) ×3 IMPLANT
MILL MEDIUM DISP (BLADE) ×2 IMPLANT
NDL HYPO 25X1 1.5 SAFETY (NEEDLE) ×1 IMPLANT
NEEDLE HYPO 25X1 1.5 SAFETY (NEEDLE) ×3 IMPLANT
NS IRRIG 1000ML POUR BTL (IV SOLUTION) ×3 IMPLANT
PACK LAMINECTOMY NEURO (CUSTOM PROCEDURE TRAY) ×3 IMPLANT
PAD ARMBOARD 7.5X6 YLW CONV (MISCELLANEOUS) ×13 IMPLANT
PRECEPT SHANK CANN 6.5X40 (Neuro Prosthesis/Implant) ×4 IMPLANT
RASP 3.0MM (RASP) ×2 IMPLANT
ROD 35MM (Rod) ×4 IMPLANT
SCREW LOCK (Screw) ×12 IMPLANT
SCREW LOCK FXNS SPNE MAS PL (Screw) IMPLANT
SCREW SHANK PRECEPT 6.5X35 (Screw) ×4 IMPLANT
SCREW TULIP 5.5 (Screw) ×8 IMPLANT
SPONGE LAP 4X18 X RAY DECT (DISPOSABLE) IMPLANT
SPONGE SURGIFOAM ABS GEL 100 (HEMOSTASIS) ×3 IMPLANT
STRIP CLOSURE SKIN 1/2X4 (GAUZE/BANDAGES/DRESSINGS) ×3 IMPLANT
SUT VIC AB 0 CT1 18XCR BRD8 (SUTURE) ×1 IMPLANT
SUT VIC AB 0 CT1 8-18 (SUTURE) ×6
SUT VIC AB 2-0 CP2 18 (SUTURE) ×5 IMPLANT
SUT VIC AB 3-0 SH 8-18 (SUTURE) ×6 IMPLANT
TOWEL OR 17X24 6PK STRL BLUE (TOWEL DISPOSABLE) ×3 IMPLANT
TOWEL OR 17X26 10 PK STRL BLUE (TOWEL DISPOSABLE) ×3 IMPLANT
TRAP SPECIMEN MUCOUS 40CC (MISCELLANEOUS) ×2 IMPLANT
TRAY FOLEY W/METER SILVER 16FR (SET/KITS/TRAYS/PACK) ×3 IMPLANT
WATER STERILE IRR 1000ML POUR (IV SOLUTION) ×3 IMPLANT

## 2015-07-21 NOTE — Transfer of Care (Signed)
Immediate Anesthesia Transfer of Care Note  Patient: [name]  Procedure(s) Performed: Procedure(s): Posterior Lumbar Interbody Fusion Lumbar Five-Sacral One, Remove percutaneous screws Lumbar Three, Lumbar Four (N/A)  Patient Location: PACU  Anesthesia Type:General  Level of Consciousness: sedated  Airway & Oxygen Therapy: Patient Spontanous Breathing and Patient connected to face mask oxygen  Post-op Assessment: Report given to RN, Post -op Vital signs reviewed and stable and Patient moving all extremities  Post vital signs: Reviewed and stable  Last Vitals:  Filed Vitals:   07/21/15 0627  BP: 137/54  Pulse: 62  Temp: 37.1 C  Resp: 18    Last Pain:  Filed Vitals:   07/21/15 0702  PainSc: 7       Patients Stated Pain Goal: 2 (07/21/15 0700)  Complications: No apparent anesthesia complications

## 2015-07-21 NOTE — Progress Notes (Signed)
Pt arrived to 5C17 via stretcher.  Alert and oriented.  C/o pain 10/10 in surgical area, repositioned for comfort.  VSS.  Will continue to monitor.  Sondra Come, RN

## 2015-07-21 NOTE — H&P (Signed)
Subjective: Patient is a 70 y.o. female admitted for PLIF. Onset of symptoms was several months ago, gradually worsening since that time.  The pain is rated severe, and is located at the across the lower back and radiates to LLE. The pain is described as aching and occurs all day. The symptoms have been progressive. Symptoms are exacerbated by exercise. MRI or CT showed spondylolisthesis with stenosis L5-S1 adjacent to previous surgery   Past Medical History  Diagnosis Date  . Diabetes mellitus without complication (HCC)   . History of pneumonia   . RA (rheumatoid arthritis) (HCC)   . Wears glasses   . TIA (transient ischemic attack)   . History of bronchitis     Past Surgical History  Procedure Laterality Date  . Back surgery  2012  . Cesarean section      x3  . Bilateral carpal tunnel release    . Knee arthroscopy Left   . Tonsillectomy    . Appendectomy    . Glaucoma surgery      Prior to Admission medications   Medication Sig Start Date End Date Taking? Authorizing Provider  diclofenac (VOLTAREN) 75 MG EC tablet Take 75 mg by mouth 2 (two) times daily.   Yes Historical Provider, MD  ezetimibe (ZETIA) 10 MG tablet Take 10 mg by mouth daily.   Yes Historical Provider, MD  fenofibrate 54 MG tablet Take 54 mg by mouth daily.   Yes Historical Provider, MD  folic acid (FOLVITE) 800 MCG tablet Take 1,600 mcg by mouth daily.   Yes Historical Provider, MD  HYDROcodone-acetaminophen (NORCO/VICODIN) 5-325 MG tablet Take 1 tablet by mouth every 12 (twelve) hours.   Yes Historical Provider, MD  methotrexate 50 MG/2ML injection Inject 20 mg into the vein every Tuesday.    Yes Historical Provider, MD  pioglitazone (ACTOS) 30 MG tablet Take 30 mg by mouth daily.   Yes Historical Provider, MD  ranitidine (ZANTAC) 75 MG tablet Take 75 mg by mouth daily. For 3 days prior to Remicade infusion   Yes Historical Provider, MD  cetirizine (ZYRTEC) 10 MG tablet Take 10 mg by mouth daily. For 3 days  prior to Remicade infusion    Historical Provider, MD  inFLIXimab in sodium chloride 0.9 % Inject 5 mg/kg into the vein every 6 (six) weeks.     Historical Provider, MD   Allergies  Allergen Reactions  . Penicillins Itching and Swelling    Has patient had a PCN reaction causing immediate rash, facial/tongue/throat swelling, SOB or lightheadedness with hypotension: Yes Has patient had a PCN reaction causing severe rash involving mucus membranes or skin necrosis: No Has patient had a PCN reaction that required hospitalization No Has patient had a PCN reaction occurring within the last 10 years: No If all of the above answers are "NO", then may proceed with Cephalosporin use.     Social History  Substance Use Topics  . Smoking status: Never Smoker   . Smokeless tobacco: Never Used  . Alcohol Use: No    History reviewed. No pertinent family history.   Review of Systems  Positive ROS: neg  All other systems have been reviewed and were otherwise negative with the exception of those mentioned in the HPI and as above.  Objective: Vital signs in last 24 hours: Temp:  [98.7 F (37.1 C)] 98.7 F (37.1 C) (06/16 0627) Pulse Rate:  [62] 62 (06/16 0627) Resp:  [18] 18 (06/16 0627) BP: (137)/(54) 137/54 mmHg (06/16 0627) SpO2:  [97 %]  97 % (06/16 0627) Weight:  [87.544 kg (193 lb)] 87.544 kg (193 lb) (06/16 0634)  General Appearance: Alert, cooperative, no distress, appears stated age Head: Normocephalic, without obvious abnormality, atraumatic Eyes: PERRL, conjunctiva/corneas clear, EOM's intact    Neck: Supple, symmetrical, trachea midline Back: Symmetric, no curvature, ROM normal, no CVA tenderness Lungs:  respirations unlabored Heart: Regular rate and rhythm Abdomen: Soft, non-tender Extremities: Extremities normal, atraumatic, no cyanosis or edema Pulses: 2+ and symmetric all extremities Skin: Skin color, texture, turgor normal, no rashes or lesions  NEUROLOGIC:   Mental  status: Alert and oriented x4,  no aphasia, good attention span, fund of knowledge, and memory Motor Exam - grossly normal Sensory Exam - grossly normal Reflexes: 1+ Coordination - grossly normal Gait - grossly normal Balance - grossly normal Cranial Nerves: I: smell Not tested  II: visual acuity  OS: nl    OD: nl  II: visual fields Full to confrontation  II: pupils Equal, round, reactive to light  III,VII: ptosis None  III,IV,VI: extraocular muscles  Full ROM  V: mastication Normal  V: facial light touch sensation  Normal  V,VII: corneal reflex  Present  VII: facial muscle function - upper  Normal  VII: facial muscle function - lower Normal  VIII: hearing Not tested  IX: soft palate elevation  Normal  IX,X: gag reflex Present  XI: trapezius strength  5/5  XI: sternocleidomastoid strength 5/5  XI: neck flexion strength  5/5  XII: tongue strength  Normal    Data Review Lab Results  Component Value Date   WBC 6.6 07/13/2015   HGB 11.9* 07/13/2015   HCT 38.0 07/13/2015   MCV 102.7* 07/13/2015   PLT 256 07/13/2015   Lab Results  Component Value Date   NA 140 07/13/2015   K 3.8 07/13/2015   CL 108 07/13/2015   CO2 27 07/13/2015   BUN 20 07/13/2015   CREATININE 0.79 07/13/2015   GLUCOSE 107* 07/13/2015   Lab Results  Component Value Date   INR 1.04 07/13/2015    Assessment/Plan: Patient admitted for PLIF L5-S1. Patient has failed a reasonable attempt at conservative therapy.  I explained the condition and procedure to the patient and answered any questions.  Patient wishes to proceed with procedure as planned. Understands risks/ benefits and typical outcomes of procedure.   JONES,DAVID S 07/21/2015 8:24 AM

## 2015-07-21 NOTE — Progress Notes (Signed)
Vancomycin per Pharmacy Consult - for Surgical Prophylaxis    69 YOF s/p spinal, on vancomycin for surgical prophylaxis d/t penicillin allergy (anaphylaxis), pharmacy to adjust dosage base one renal function. Scr 0.79 on 07/13/2015, est. crcl  ~ 68ml/min. Received pre-op dose 1g vancomycin at ~ 0840. Has a closed system drain  Plan: Vancomycin 1000 mg IV Q 12hrs at next dose 2100. Monitor renal function and drain status Vancomycin trough at steady state if continues for > 72 hrs.  Thanks.   Bayard Hugger, PharmD, BCPS  Clinical Pharmacist  Pager: [phone]

## 2015-07-21 NOTE — Anesthesia Preprocedure Evaluation (Addendum)
Anesthesia Evaluation  Patient identified by MRN, date of birth, ID band Patient awake    Reviewed: Allergy & Precautions, NPO status , Patient's Chart, lab work & pertinent test results  Airway Mallampati: II  TM Distance: >3 FB Neck ROM: Full    Dental  (+) Teeth Intact   Pulmonary neg pulmonary ROS,    breath sounds clear to auscultation       Cardiovascular negative cardio ROS   Rhythm:Regular Rate:Normal     Neuro/Psych TIA   GI/Hepatic negative GI ROS, Neg liver ROS,   Endo/Other  diabetes, Well Controlled, Type 2  Renal/GU negative Renal ROS     Musculoskeletal  (+) Arthritis , Rheumatoid disorders,    Abdominal   Peds  Hematology   Anesthesia Other Findings   Reproductive/Obstetrics                           Anesthesia Physical Anesthesia Plan  ASA: III  Anesthesia Plan: General   Post-op Pain Management:    Induction: Intravenous  Airway Management Planned: Oral ETT  Additional Equipment:   Intra-op Plan:   Post-operative Plan: Extubation in OR  Informed Consent: I have reviewed the patients History and Physical, chart, labs and discussed the procedure including the risks, benefits and alternatives for the proposed anesthesia with the patient or authorized representative who has indicated his/her understanding and acceptance.   Dental advisory given  Plan Discussed with: Anesthesiologist and CRNA  Anesthesia Plan Comments:        Anesthesia Quick Evaluation

## 2015-07-21 NOTE — Anesthesia Postprocedure Evaluation (Signed)
Anesthesia Post Note  Patient: [name]  Procedure(s) Performed: Procedure(s) (LRB): Posterior Lumbar Interbody Fusion Lumbar Five-Sacral One, Remove percutaneous screws Lumbar Three, Lumbar Four (N/A)  Patient location during evaluation: PACU Anesthesia Type: General Level of consciousness: awake Pain management: pain level controlled Vital Signs Assessment: post-procedure vital signs reviewed and stable Respiratory status: spontaneous breathing Cardiovascular status: stable Anesthetic complications: no    Last Vitals:  Filed Vitals:   07/21/15 1230 07/21/15 1246  BP: 111/51 103/46  Pulse: 67 64  Temp:    Resp: 39 12    Last Pain:  Filed Vitals:   07/21/15 1247  PainSc: Asleep                 EDWARDS,Melissa Pulido

## 2015-07-21 NOTE — Op Note (Signed)
07/21/2015  12:04 PM  PATIENT:  [name]  70 y.o. female  PRE-OPERATIVE DIAGNOSIS:  Adjacent level spondylolisthesis and stenosis and back and leg pain  POST-OPERATIVE DIAGNOSIS:  same  PROCEDURE:   1. Decompressive lumbar laminectomy L5-S1 requiring more work than would be required for a simple exposure of the disk for PLIF in order to adequately decompress the neural elements and address the spinal stenosis 2. Posterior lumbar interbody fusion L5-S1 using PEEK interbody cages packed with morcellized allograft and autograft 3. Posterior fixation L5-S1 using nuvasive screws.  4. Intertransverse arthrodesis L5-S1 using morcellized autograft and allograft. 5. Removal of nonsegmental fixation L4-5  SURGEON:  Marikay Alar, MD  ASSISTANTS: Ditty  ANESTHESIA:  General  EBL: 200 ml  Total I/O In: 1000 [I.V.:1000] Out: 575 [Urine:375; Blood:200]  BLOOD ADMINISTERED:none  DRAINS: Hemovac   INDICATION FOR PROCEDURE: this patient presented with back and leg pain after L3-4 and L4-5 fusion.she tried medical management without relief. I recommended decompression and fusion L5-S1 to address segmental instability and spinal stenosis. Her pain was debilitating. Patient understood the risks, benefits, and alternatives and potential outcomes and wished to proceed.  PROCEDURE DETAILS:  The patient was brought to the operating room. After induction of generalized endotracheal anesthesia the patient was rolled into the prone position on chest rolls and all pressure points were padded. The patient's lumbar region was cleaned and then prepped with DuraPrep and draped in the usual sterile fashion. Anesthesia was injected and then a dorsal midline incision was made and carried down to the lumbosacral fascia. The fascia was opened and the paraspinous musculature was taken down in a subperiosteal fashion to expose L5-S1 in the previously placed hardware. A self-retaining retractor was placed. I removed  the locking caps from the L5 pedicle screws and then cut the rod and removed the rod.  I then turned my attention to the decompression and  Lumbar hemi laminectomies, hemi- facetectomies, and foraminotomies were performed at L5-S1. The patient had significant spinal stenosis and this required more work than would be required for a simple exposure of the disc for posterior lumbar interbody fusion. Much more generous decompression was undertaken in order to adequately decompress the neural elements and address the patient's leg pain. The yellow ligament was removed to expose the underlying dura and nerve roots, and generous foraminotomies were performed to adequately decompress the neural elements. Both the exiting and traversing nerve roots were decompressed on both sides until a coronary dilator passed easily along the nerve roots. Once the decompression was complete, I turned my attention to the posterior lower lumbar interbody fusion. The epidural venous vasculature was coagulated and cut sharply. Disc space was incised and the initial discectomy was performed with pituitary rongeurs. The disc space was distracted with sequential distractors to a height of 35mm. We then used a series of scrapers and shavers to prepare the endplates for fusion. The midline was prepared with Epstein curettes. Once the complete discectomy was finished, we packed an appropriate sized peek interbody cage with local autograft and morcellized allograft, gently retracted the nerve root, and tapped the cage into position at L5-S1.  The midline between the cages was packed with morselized autograft and allograft. We then turned our attention to the placement of the pedicle screws. The old pedicle screws were removed and larger pedicle screws were placed into the L5 vertebral bodies.The pedicle screw entry zones of S1 were identified utilizing surface landmarks and fluoroscopy. I drilled into each pedicle and probed each  pedicle, and tapped  each pedicle with the appropriate tap. We palpated with a ball probe to assure no break in the cortex. We then placed 6 5 x 35 mm into the pedicles bilaterally at S1. We then decorticated the transverse processes and laid a mixture of morcellized autograft and allograft out over these to perform intertransverse arthrodesis at L5-S1. We then placed lordotic rods into the multiaxial screw heads of the pedicle screws and locked these in position with the locking caps and anti-torque device. We then checked our construct with AP and lateral fluoroscopy. Irrigated with copious amounts of bacitracin-containing saline solution. Placed a medium Hemovac drain through separate stab incision. Inspected the nerve roots once again to assure adequate decompression, lined to the dura with Gelfoam, and closed the muscle and the fascia with 0 Vicryl. Closed the subcutaneous tissues with 2-0 Vicryl and subcuticular tissues with 3-0 Vicryl. The skin was closed with benzoin and Steri-Strips. Dressing was then applied, the patient was awakened from general anesthesia and transported to the recovery room in stable condition. At the end of the procedure all sponge, needle and instrument counts were correct.   PLAN OF CARE: Admit to inpatient   PATIENT DISPOSITION:  PACU - hemodynamically stable.   Delay start of Pharmacological VTE agent (>24hrs) due to surgical blood loss or risk of bleeding:  yes

## 2015-07-21 NOTE — Anesthesia Procedure Notes (Signed)
Procedure Name: Intubation Date/Time: 07/21/2015 8:40 AM Performed by: Sharlene Dory E Pre-anesthesia Checklist: Patient identified, Emergency Drugs available, Suction available, Patient being monitored and Timeout performed Patient Re-evaluated:Patient Re-evaluated prior to inductionOxygen Delivery Method: Circle system utilized Preoxygenation: Pre-oxygenation with 100% oxygen Intubation Type: IV induction Ventilation: Mask ventilation without difficulty Laryngoscope Size: Mac and 3 Grade View: Grade II Tube type: Oral Tube size: 7.0 mm Number of attempts: 1 Airway Equipment and Method: Bougie stylet Placement Confirmation: positive ETCO2 and breath sounds checked- equal and bilateral Secured at: 21 cm Tube secured with: Tape Dental Injury: Teeth and Oropharynx as per pre-operative assessment and Injury to lip

## 2015-07-22 LAB — GLUCOSE, CAPILLARY
GLUCOSE-CAPILLARY: 120 mg/dL — AB (ref 65–99)
GLUCOSE-CAPILLARY: 119 mg/dL — AB (ref 65–99)
GLUCOSE-CAPILLARY: 105 mg/dL — AB (ref 65–99)
Glucose-Capillary: 155 mg/dL — ABNORMAL HIGH (ref 65–99)

## 2015-07-22 MED ORDER — POLYETHYLENE GLYCOL 3350 17 G PO PACK
17.0000 g | PACK | Freq: Every day | ORAL | Status: DC
  Administered 2015-07-22 – 2015-07-23 (×2): 17 g via ORAL
  Filled 2015-07-22 (×2): qty 1

## 2015-07-22 NOTE — Evaluation (Signed)
Physical Therapy Evaluation Patient Details Name: RIDHIMA GOLBERG MRN: [id number] DOB: 1945-09-04 Today's Date: 07/22/2015   History of Present Illness  Patient is a 70 y/o female with hx of DM, RA, TIA and prior back surgery presents s/p L5-S1 PLIF.  Clinical Impression  Patient presents with pain and post surgical deficits s/p above surgery impacting mobility. Pt with hx of left knee pain- planning to get knee replacement this year. Pt cares for spouse at home but will have support from daughters at discharge. Tolerated gait training with supervision for safety. Education on back precautions. Pt worried about bed mobility and stair training- will practice tomorrow. Will follow acutely to maximize independence and mobility prior to return home.    Follow Up Recommendations No PT follow up;Supervision - Intermittent    Equipment Recommendations  None recommended by PT    Recommendations for Other Services OT consult     Precautions / Restrictions Precautions Precautions: Back Precaution Booklet Issued: No Precaution Comments: Education on 3/3 back precautions. Required Braces or Orthoses: Spinal Brace Spinal Brace: Lumbar corset;Applied in sitting position Restrictions Weight Bearing Restrictions: No      Mobility  Bed Mobility               General bed mobility comments: Up in chair upon PT arrival.   Transfers Overall transfer level: Needs assistance Equipment used: Rolling walker (2 wheeled) Transfers: Sit to/from Stand Sit to Stand: Supervision         General transfer comment: Supervision for safety. Stood from Landscape architect. Good demo of technique.  Ambulation/Gait Ambulation/Gait assistance: Supervision Ambulation Distance (Feet): 150 Feet Assistive device: Rolling walker (2 wheeled) Gait Pattern/deviations: Step-through pattern;Decreased stride length Gait velocity: decreased   General Gait Details: Slow, guarded gait. Cues to adhere to back  precautions.  Stairs            Wheelchair Mobility    Modified Rankin (Stroke Patients Only)       Balance Overall balance assessment: Needs assistance Sitting-balance support: Feet supported;No upper extremity supported Sitting balance-Leahy Scale: Good Sitting balance - Comments: Able to donn brace with setup.     Standing balance-Leahy Scale: Good Standing balance comment: Able to manipulate items on her tray without UE support, poor drink etc. No LOB.                             Pertinent Vitals/Pain Pain Assessment: Faces Faces Pain Scale: Hurts a little bit Pain Location: back at incision Pain Descriptors / Indicators: Operative site guarding;Sore Pain Intervention(s): Monitored during session;Premedicated before session;Repositioned    Home Living Family/patient expects to be discharged to:: Private residence Living Arrangements: Spouse/significant other;Children Available Help at Discharge: Family;Available PRN/intermittently (cares for spouse at home, but daughters will be able to help) Type of Home: House Home Access: Stairs to enter Entrance Stairs-Rails: Right Entrance Stairs-Number of Steps: 3 Home Layout: Two level;Bed/bath upstairs Home Equipment: Walker - 2 wheels      Prior Function Level of Independence: Independent         Comments: Works part time for [employer], loves to garden and work in the yard. Plans to have TKR on left knee after this back surgery. Has been sleeping on couch for the past year.  Helps care for spouse- manages meds, cooks etc.     Hand Dominance        Extremity/Trunk Assessment   Upper Extremity Assessment: Defer to OT  evaluation           Lower Extremity Assessment:  (Plans to have left knee replacement this year. )         Communication   Communication: No difficulties  Cognition Arousal/Alertness: Awake/alert Behavior During Therapy: WFL for tasks assessed/performed Overall  Cognitive Status: Within Functional Limits for tasks assessed                      General Comments General comments (skin integrity, edema, etc.): Reviewed positioning, bed mobility, precautions etc.     Exercises        Assessment/Plan    PT Assessment Patient needs continued PT services  PT Diagnosis Generalized weakness;Acute pain   PT Problem List Decreased strength;Decreased activity tolerance;Decreased mobility;Decreased balance;Pain  PT Treatment Interventions Gait training;Stair training;Therapeutic exercise;Therapeutic activities;DME instruction;Balance training;Patient/family education   PT Goals (Current goals can be found in the Care Plan section) Acute Rehab PT Goals Patient Stated Goal: to lose some weight and get back to working in my yard PT Goal Formulation: With patient Time For Goal Achievement: 08/05/15 Potential to Achieve Goals: Good    Frequency Min 5X/week   Barriers to discharge Inaccessible home environment stairs to enter home and to get to bedroom    Co-evaluation               End of Session Equipment Utilized During Treatment: Gait belt;Back brace Activity Tolerance: Patient tolerated treatment well Patient left: in chair;with call bell/phone within reach Nurse Communication: Mobility status         Time: 0800-0825 PT Time Calculation (min) (ACUTE ONLY): 25 min   Charges:   PT Evaluation $PT Eval Moderate Complexity: 1 Procedure PT Treatments $Gait Training: 8-22 mins   PT G Codes:        Vivianne Carles A Marigny Borre 07/22/2015, 8:38 AM Mylo Red, PT, DPT [phone]

## 2015-07-22 NOTE — Evaluation (Signed)
Occupational Therapy Evaluation Patient Details Name: JEZABELLA SCHRIEVER MRN: [id number] DOB: 11-20-1945 Today's Date: 07/22/2015    History of Present Illness Patient is a 70 y/o female with hx of DM, RA, TIA and prior back surgery presents s/p L5-S1 PLIF.   Clinical Impression   Pt. Was provided back sheet for adhering to precautions. Pt. Has had previous back surgery and has had equipment.   Pt. Was able to demo ability to follow back precautions for ADLs and mobility. Pt. Does not need any further OT and is in agreement.   Follow Up Recommendations  No OT follow up    Equipment Recommendations  None recommended by OT    Recommendations for Other Services       Precautions / Restrictions Precautions Precautions: Back Precaution Booklet Issued: No Precaution Comments: Education on 3/3 back precautions. Required Braces or Orthoses: Spinal Brace Spinal Brace: Lumbar corset;Applied in sitting position Restrictions Weight Bearing Restrictions: No      Mobility Bed Mobility Overal bed mobility: Modified Independent             General bed mobility comments: Up in chair upon PT arrival.   Transfers Overall transfer level: Needs assistance Equipment used: Rolling walker (2 wheeled) Transfers: Sit to/from Stand Sit to Stand: Modified independent (Device/Increase time)         General transfer comment:  (Mod I)    Balance Overall balance assessment: Needs assistance Sitting-balance support: Feet supported;No upper extremity supported Sitting balance-Leahy Scale: Good Sitting balance - Comments: Able to donn brace with setup.     Standing balance-Leahy Scale: Good Standing balance comment: Able to manipulate items on her tray without UE support, poor drink etc. No LOB.                            ADL Overall ADL's : Modified independent                                       General ADL Comments: Pt. was ed on use of AE and DME  and was provided back handout. Pt. was able to return demo use. Pt. has had previous back sx.      Vision     Perception     Praxis      Pertinent Vitals/Pain Pain Assessment: 0-10 Pain Score: 7  Faces Pain Scale: Hurts a little bit Pain Location:  (back at surgical site.) Pain Descriptors / Indicators: Burning Pain Intervention(s): Limited activity within patient's tolerance;Premedicated before session     Hand Dominance     Extremity/Trunk Assessment Upper Extremity Assessment Upper Extremity Assessment: Overall WFL for tasks assessed   Lower Extremity Assessment Lower Extremity Assessment:  (Plans to have left knee replacement this year. )       Communication Communication Communication: No difficulties   Cognition Arousal/Alertness: Awake/alert Behavior During Therapy: WFL for tasks assessed/performed Overall Cognitive Status: Within Functional Limits for tasks assessed                     General Comments       Exercises       Shoulder Instructions      Home Living Family/patient expects to be discharged to:: Private residence Living Arrangements: Spouse/significant other;Children Available Help at Discharge: Family;Available PRN/intermittently (cares for spouse at home, but daughters will be able to help)  Type of Home: House Home Access: Stairs to enter [employer] of Steps: 3 Entrance Stairs-Rails: Right Home Layout: Two level;Bed/bath upstairs Alternate Level Stairs-Number of Steps: 1 flight Alternate Level Stairs-Rails: Right Bathroom Shower/Tub: Tub/shower unit;Curtain Shower/tub characteristics: Curtain Bathroom Toilet: Handicapped height     Home Equipment: Environmental consultant - 2 wheels;Shower seat          Prior Functioning/Environment Level of Independence: Independent        Comments: Works part time for [employer], loves to garden and work in the yard. Plans to have TKR on left knee after this back surgery. Has been  sleeping on couch for the past year.  Helps care for spouse- manages meds, cooks etc.    OT Diagnosis:     OT Problem List:     OT Treatment/Interventions:      OT Goals(Current goals can be found in the care plan section) Acute Rehab OT Goals Patient Stated Goal: go home tomorrow  OT Frequency:     Barriers to D/C:            Co-evaluation              End of Session Equipment Utilized During Treatment: Rolling walker  Activity Tolerance: Patient tolerated treatment well Patient left: in bed;with call bell/phone within reach;with family/visitor present   Time: 1115-1208 OT Time Calculation (min): 53 min Charges:  OT General Charges $OT Visit: 1 Procedure OT Evaluation $OT Eval Moderate Complexity: 1 Procedure OT Treatments $Self Care/Home Management : 23-37 mins G-Codes:    Malayiah Mcbrayer 2015/08/14, 12:08 PM

## 2015-07-22 NOTE — Progress Notes (Signed)
Patient ID: [name], female   DOB: [birth year], 70 y.o.   MRN: [id number] Subjective:  The patient is alert and pleasant. She is appropriately sore. She wants to go home tomorrow.  Objective: Vital signs in last 24 hours: Temp:  [97.7 F (36.5 C)-98.7 F (37.1 C)] 98.4 F (36.9 C) (06/17 0532) Pulse Rate:  [51-93] 51 (06/17 0532) Resp:  [11-39] 20 (06/17 0532) BP: (94-140)/(42-68) 98/42 mmHg (06/17 0532) SpO2:  [93 %-100 %] 95 % (06/17 0532)  Intake/Output from previous day: 06/16 0701 - 06/17 0700 In: 1480 [P.O.:480; I.V.:1000] Out: 1275 [Urine:850; Drains:225; Blood:200] Intake/Output this shift:    Physical exam the patient is alert and pleasant. He is moving her lower extremities well.  Lab Results: No results for input(s): WBC, HGB, HCT, PLT in the last 72 hours. BMET No results for input(s): NA, K, CL, CO2, GLUCOSE, BUN, CREATININE, CALCIUM in the last 72 hours.  Studies/Results: Dg Lumbar Spine 2-3 Views  07/21/2015  CLINICAL DATA:  Post L5-S1 fusion EXAM: DG C-ARM 61-120 MIN; LUMBAR SPINE - 2-3 VIEW COMPARISON:  04/01/2015 FINDINGS: Two views of the lumbar spine submitted. Again noted posterior fusion L3-L4 level with alignment preserved. Stable postsurgical disc spacer at L4-L5 level. There is new posterior fusion at L5-S1 level. Postsurgical disc material at L5-S1 level. The alignment is preserved. IMPRESSION: New posterior metallic fusion at L5-S1 level. Alignment is preserved. Fluoroscopy time was 20 seconds.  Please see the operative report. Electronically Signed   By: Natasha Mead M.D.   On: 07/21/2015 12:01   Dg C-arm 61-120 Min  07/21/2015  CLINICAL DATA:  Post L5-S1 fusion EXAM: DG C-ARM 61-120 MIN; LUMBAR SPINE - 2-3 VIEW COMPARISON:  04/01/2015 FINDINGS: Two views of the lumbar spine submitted. Again noted posterior fusion L3-L4 level with alignment preserved. Stable postsurgical disc spacer at L4-L5 level. There is new posterior fusion at L5-S1 level.  Postsurgical disc material at L5-S1 level. The alignment is preserved. IMPRESSION: New posterior metallic fusion at L5-S1 level. Alignment is preserved. Fluoroscopy time was 20 seconds.  Please see the operative report. Electronically Signed   By: Natasha Mead M.D.   On: 07/21/2015 12:01    Assessment/Plan: Postop day #1: The patient is doing well. We will mobilize her. She will likely go home tomorrow.  LOS: 1 day     [name] D 07/22/2015, 7:44 AM

## 2015-07-23 LAB — GLUCOSE, CAPILLARY
GLUCOSE-CAPILLARY: 141 mg/dL — AB (ref 65–99)
Glucose-Capillary: 113 mg/dL — ABNORMAL HIGH (ref 65–99)

## 2015-07-23 MED ORDER — OXYCODONE-ACETAMINOPHEN 5-325 MG PO TABS: 1.0000 | ORAL_TABLET | ORAL | Status: DC | PRN

## 2015-07-23 NOTE — Progress Notes (Signed)
Physical Therapy Treatment Patient Details Name: [name] MRN: [id number] DOB: 1945-03-14 Today's Date: 07/23/2015    History of Present Illness Patient is a 70 y/o female with hx of DM, RA, TIA and prior back surgery presents s/p L5-S1 PLIF.    PT Comments    Pt performed increased mobility and able to advance gait distance with addition of stair negotiation.  Pt reports she feels comfortable with technique.  During gait PTA observed RW height.  Pt will need youth height RW at d/c for comfort and fit.  Pt expressed concern for peri care at home.  PTA educated on adpative equipment available and informed OT of pt needs.  Pt ready for d/c.  Will continue POC during hospitalization.    Follow Up Recommendations  No PT follow up;Supervision - Intermittent     Equipment Recommendations  Rolling walker with 5" wheels (YOUTH HEIGHT!!!)    Recommendations for Other Services       Precautions / Restrictions Precautions Precautions: Back Precaution Booklet Issued: No Precaution Comments: Education on 3/3 back precautions. Required Braces or Orthoses: Spinal Brace Spinal Brace: Lumbar corset;Applied in sitting position (Educated on correct placement.  ) Restrictions Weight Bearing Restrictions: No    Mobility  Bed Mobility               General bed mobility comments: Up in chair upon PT arrival.  Pt reports she feels confident with bed mobility.  PTA offered assistance to re-educate pt on technique.  pt reports she will be fine.    Transfers Overall transfer level: Needs assistance Equipment used: Rolling walker (2 wheeled) Transfers: Sit to/from Stand Sit to Stand: Modified independent (Device/Increase time)         General transfer comment: Pt performed with good technique no cues required.  Pt does require increased time to complete.    Ambulation/Gait Ambulation/Gait assistance: Supervision Ambulation Distance (Feet): 200 Feet Assistive device: Rolling  walker (2 wheeled) Gait Pattern/deviations: Step-through pattern;Decreased stride length;Trunk flexed Gait velocity: decreased   General Gait Details: Pt performed step through pattern with cues for postural awareness and RW position.  PTA educated pt on need for youth height RW at d/c.  Pt also educated on how to size height of RW at home.     Stairs Stairs: Yes Stairs assistance: Supervision Stair Management: One rail Right;One rail Left;Step to pattern;Forwards (Pt used R rail ascending and L rail descending. ) Number of Stairs: 12 General stair comments: Cues for sequencing and hand placement on rail.  Pt educated to support unilaterally to avoid twisting motions during stair negotiations.    Wheelchair Mobility    Modified Rankin (Stroke Patients Only)       Balance Overall balance assessment: Needs assistance   Sitting balance-Leahy Scale: Good Sitting balance - Comments: Able to donn brace with setup.  Re-educated pt on placement (lower) to ensure correct fit.       Standing balance-Leahy Scale: Good                      Cognition Arousal/Alertness: Awake/alert Behavior During Therapy: WFL for tasks assessed/performed Overall Cognitive Status: Within Functional Limits for tasks assessed                      Exercises      General Comments        Pertinent Vitals/Pain Pain Assessment: 0-10 Pain Score: 2  Pain Location: back at surgical site, reports weakness  in L knee.   Pain Descriptors / Indicators: Discomfort;Sore Pain Intervention(s): Limited activity within patient's tolerance;Monitored during session    Home Living                      Prior Function            PT Goals (current goals can now be found in the care plan section) Acute Rehab PT Goals Patient Stated Goal: go home tomorrow Potential to Achieve Goals: Good Progress towards PT goals: Progressing toward goals    Frequency  Min 5X/week    PT Plan  Discharge plan needs to be updated    Co-evaluation             End of Session Equipment Utilized During Treatment: Gait belt;Back brace Activity Tolerance: Patient tolerated treatment well Patient left: in chair;with call bell/phone within reach     Time: 0939-1007 PT Time Calculation (min) (ACUTE ONLY): 28 min  Charges:  $Gait Training: 23-37 mins                    G Codes:      [name] 08-16-2015, 12:06 PM [name], PTA pager [phone]

## 2015-07-23 NOTE — Progress Notes (Signed)
CM received call from PT to arrange for a youth walker.  CM called pt's room and pt states she does not need the recc youth walker or the 3n1 bc she has two walkers at home and a raised toilet seat.  NO other CM needs were communicated.

## 2015-07-23 NOTE — Discharge Summary (Signed)
Physician Discharge Summary  Patient ID: [name] MRN: [id number] DOB/AGE: May 09, 1945 70 y.o.  Admit date: 07/21/2015 Discharge date: 07/23/2015  Admission Diagnoses: Lumbar disc degeneration, spinal stenosis, lumbago  Discharge Diagnoses: The same Active Problems:   S/P lumbar spinal fusion   Discharged Condition: good  Hospital Course: Dr. Yetta Barre performed an L5-S1 decompression, instrumentation, and fusion on the patient on 07/21/2015.  The patient's postoperative course was unremarkable.  On postoperative day #2 the patient requested discharge to home. She was given written and oral discharge instructions. All her questions were answered.  Consults: None Significant Diagnostic Studies: None Treatments: L5-S1 decompression, instrumentation, and fusion. Discharge Exam: Blood pressure 93/47, pulse 59, temperature 98 F (36.7 C), temperature source Oral, resp. rate 18, height 5\' 2"  (1.575 m), weight 87.544 kg (193 lb), SpO2 98 %. The patient is alert and pleasant. She looks well. Her strength is grossly normal in her lower extremities.  Disposition: Home  Discharge Instructions    Call MD for:  difficulty breathing, headache or visual disturbances    Complete by:  As directed      Call MD for:  extreme fatigue    Complete by:  As directed      Call MD for:  hives    Complete by:  As directed      Call MD for:  persistant dizziness or light-headedness    Complete by:  As directed      Call MD for:  persistant nausea and vomiting    Complete by:  As directed      Call MD for:  redness, tenderness, or signs of infection (pain, swelling, redness, odor or green/yellow discharge around incision site)    Complete by:  As directed      Call MD for:  severe uncontrolled pain    Complete by:  As directed      Call MD for:  temperature >100.4    Complete by:  As directed      Diet - low sodium heart healthy    Complete by:  As directed      Discharge instructions     Complete by:  As directed   Call [phone] for a followup appointment. Take a stool softener while you are using pain medications.     Driving Restrictions    Complete by:  As directed   Do not drive for 2 weeks.     Increase activity slowly    Complete by:  As directed      Lifting restrictions    Complete by:  As directed   Do not lift more than 5 pounds. No excessive bending or twisting.     May shower / Bathe    Complete by:  As directed   He may shower after the pain she is removed 3 days after surgery. Leave the incision alone.     Remove dressing in 24 hours    Complete by:  As directed             Medication List    STOP taking these medications        diclofenac 75 MG EC tablet  Commonly known as:  VOLTAREN     HYDROcodone-acetaminophen 5-325 MG tablet  Commonly known as:  NORCO/VICODIN      TAKE these medications        cetirizine 10 MG tablet  Commonly known as:  ZYRTEC  Take 10 mg by mouth daily. For 3 days prior to Remicade infusion  ezetimibe 10 MG tablet  Commonly known as:  ZETIA  Take 10 mg by mouth daily.     fenofibrate 54 MG tablet  Take 54 mg by mouth daily.     folic acid 800 MCG tablet  Commonly known as:  FOLVITE  Take 1,600 mcg by mouth daily.     inFLIXimab in sodium chloride 0.9 %  Inject 5 mg/kg into the vein every 6 (six) weeks.     methotrexate 50 MG/2ML injection  Inject 20 mg into the vein every Tuesday.     oxyCODONE-acetaminophen 5-325 MG tablet  Commonly known as:  PERCOCET/ROXICET  Take 1-2 tablets by mouth every 4 (four) hours as needed for moderate pain.     pioglitazone 30 MG tablet  Commonly known as:  ACTOS  Take 30 mg by mouth daily.     ranitidine 75 MG tablet  Commonly known as:  ZANTAC  Take 75 mg by mouth daily. For 3 days prior to Remicade infusion         Signed: Sana Tessmer D 07/23/2015, 11:46 AM

## 2015-08-07 ENCOUNTER — Other Ambulatory Visit: Payer: Self-pay | Admitting: Neurological Surgery

## 2015-08-07 DIAGNOSIS — M4317 Spondylolisthesis, lumbosacral region: Secondary | ICD-10-CM

## 2015-08-09 ENCOUNTER — Ambulatory Visit
Admission: RE | Admit: 2015-08-09 | Discharge: 2015-08-09 | Disposition: A | Discharge status: Home / Self Care | Payer: Medicare Other | Source: Ambulatory Visit | Attending: Neurological Surgery | Admitting: Neurological Surgery

## 2015-08-09 DIAGNOSIS — M4317 Spondylolisthesis, lumbosacral region: Secondary | ICD-10-CM

## 2015-08-09 MED ORDER — GADOBENATE DIMEGLUMINE 529 MG/ML IV SOLN
18.0000 mL | Freq: Once | INTRAVENOUS | Status: AC | PRN
  Administered 2015-08-09: 18 mL via INTRAVENOUS

## 2015-08-12 ENCOUNTER — Other Ambulatory Visit: Payer: Self-pay | Admitting: Neurological Surgery

## 2015-08-12 DIAGNOSIS — M4317 Spondylolisthesis, lumbosacral region: Secondary | ICD-10-CM

## 2015-08-15 ENCOUNTER — Ambulatory Visit
Admission: RE | Admit: 2015-08-15 | Discharge: 2015-08-15 | Disposition: A | Discharge status: Home / Self Care | Payer: Medicare Other | Source: Ambulatory Visit | Attending: Neurological Surgery | Admitting: Neurological Surgery

## 2015-08-15 DIAGNOSIS — M4317 Spondylolisthesis, lumbosacral region: Secondary | ICD-10-CM

## 2015-08-17 ENCOUNTER — Other Ambulatory Visit: Payer: Medicare Other

## 2015-11-23 ENCOUNTER — Emergency Department (HOSPITAL_COMMUNITY)
Admission: EM | Admit: 2015-11-23 | Discharge: 2015-11-23 | Disposition: A | Discharge status: Home / Self Care | Payer: Medicare Other | Attending: Emergency Medicine | Admitting: Emergency Medicine

## 2015-11-23 ENCOUNTER — Other Ambulatory Visit: Payer: Self-pay | Admitting: Family Medicine

## 2015-11-23 ENCOUNTER — Encounter (HOSPITAL_COMMUNITY): Payer: Self-pay | Admitting: Emergency Medicine

## 2015-11-23 ENCOUNTER — Ambulatory Visit
Admission: RE | Admit: 2015-11-23 | Discharge: 2015-11-23 | Disposition: A | Discharge status: Home / Self Care | Payer: Medicare Other | Source: Ambulatory Visit | Attending: Family Medicine | Admitting: Family Medicine

## 2015-11-23 DIAGNOSIS — Z5321 Procedure and treatment not carried out due to patient leaving prior to being seen by health care provider: Secondary | ICD-10-CM | POA: Diagnosis not present

## 2015-11-23 DIAGNOSIS — Y92007 Garden or yard of unspecified non-institutional (private) residence as the place of occurrence of the external cause: Secondary | ICD-10-CM | POA: Diagnosis not present

## 2015-11-23 DIAGNOSIS — Z8673 Personal history of transient ischemic attack (TIA), and cerebral infarction without residual deficits: Secondary | ICD-10-CM | POA: Insufficient documentation

## 2015-11-23 DIAGNOSIS — W010XXA Fall on same level from slipping, tripping and stumbling without subsequent striking against object, initial encounter: Secondary | ICD-10-CM | POA: Diagnosis not present

## 2015-11-23 DIAGNOSIS — E119 Type 2 diabetes mellitus without complications: Secondary | ICD-10-CM | POA: Diagnosis not present

## 2015-11-23 DIAGNOSIS — Y939 Activity, unspecified: Secondary | ICD-10-CM | POA: Insufficient documentation

## 2015-11-23 DIAGNOSIS — R51 Headache: Principal | ICD-10-CM

## 2015-11-23 DIAGNOSIS — Y999 Unspecified external cause status: Secondary | ICD-10-CM | POA: Insufficient documentation

## 2015-11-23 DIAGNOSIS — R519 Headache, unspecified: Secondary | ICD-10-CM

## 2015-11-23 DIAGNOSIS — R0781 Pleurodynia: Secondary | ICD-10-CM | POA: Diagnosis present

## 2015-11-23 LAB — URINALYSIS, ROUTINE W REFLEX MICROSCOPIC
Glucose, UA: NEGATIVE mg/dL
Hgb urine dipstick: NEGATIVE
Ketones, ur: 15 mg/dL — AB
Nitrite: NEGATIVE
Protein, ur: NEGATIVE mg/dL
Specific Gravity, Urine: 1.025 (ref 1.005–1.030)
pH: 5 (ref 5.0–8.0)

## 2015-11-23 LAB — CBC
HEMATOCRIT: 32.4 % — AB (ref 36.0–46.0)
HEMOGLOBIN: 10.4 g/dL — AB (ref 12.0–15.0)
MCH: 31.7 pg (ref 26.0–34.0)
MCHC: 32.1 g/dL (ref 30.0–36.0)
MCV: 98.8 fL (ref 78.0–100.0)
Platelets: 303 10*3/uL (ref 150–400)
RBC: 3.28 MIL/uL — AB (ref 3.87–5.11)
RDW: 15.8 % — ABNORMAL HIGH (ref 11.5–15.5)
WBC: 11.6 10*3/uL — AB (ref 4.0–10.5)

## 2015-11-23 LAB — BASIC METABOLIC PANEL WITH GFR
Anion gap: 7 (ref 5–15)
BUN: 30 mg/dL — ABNORMAL HIGH (ref 6–20)
CO2: 26 mmol/L (ref 22–32)
Calcium: 10.3 mg/dL (ref 8.9–10.3)
Chloride: 106 mmol/L (ref 101–111)
Creatinine, Ser: 0.99 mg/dL (ref 0.44–1.00)
GFR calc Af Amer: >60 mL/min
GFR calc non Af Amer: 57 mL/min — ABNORMAL LOW
Glucose, Bld: 164 mg/dL — ABNORMAL HIGH (ref 65–99)
Potassium: 4 mmol/L (ref 3.5–5.1)
Sodium: 139 mmol/L (ref 135–145)

## 2015-11-23 LAB — URINE MICROSCOPIC-ADD ON

## 2015-11-23 NOTE — ED Notes (Signed)
Pt's family states pt wants to go home and see PCP in the am; pt's IV was removed by Triage RN and pt left ED with family

## 2015-11-23 NOTE — ED Triage Notes (Signed)
Per EMS:  Pt presents to ED for assessment of dizziness which started approx 9am.  Pt went back to sleep and got up approx 2pm, went outside to do yard work and pt trip and fell.  Pt sts recent history of new falls with "drop foot".  Pt c/o right rib pain after fall.  Pt sts dizziness returned this evening.  No other neuro deficits noted.

## 2016-04-24 ENCOUNTER — Ambulatory Visit: Payer: Medicare Other | Admitting: Neurology

## 2016-04-26 ENCOUNTER — Ambulatory Visit (INDEPENDENT_AMBULATORY_CARE_PROVIDER_SITE_OTHER): Payer: Medicare Other | Admitting: Neurology

## 2016-04-26 ENCOUNTER — Encounter: Payer: Self-pay | Admitting: Neurology

## 2016-04-26 VITALS — BP 144/61 | HR 57 | Ht 62.0 in | Wt 200.2 lb

## 2016-04-26 DIAGNOSIS — R519 Headache, unspecified: Secondary | ICD-10-CM

## 2016-04-26 DIAGNOSIS — G43109 Migraine with aura, not intractable, without status migrainosus: Secondary | ICD-10-CM

## 2016-04-26 DIAGNOSIS — R51 Headache: Secondary | ICD-10-CM

## 2016-04-26 DIAGNOSIS — G43809 Other migraine, not intractable, without status migrainosus: Secondary | ICD-10-CM

## 2016-04-26 DIAGNOSIS — G4719 Other hypersomnia: Secondary | ICD-10-CM | POA: Diagnosis not present

## 2016-04-26 MED ORDER — SUMATRIPTAN SUCCINATE 100 MG PO TABS: 100.0000 mg | ORAL_TABLET | Freq: Once | ORAL | 12 refills | Status: DC | PRN

## 2016-04-26 MED ORDER — ONDANSETRON 4 MG PO TBDP: 4.0000 mg | ORAL_TABLET | Freq: Three times a day (TID) | ORAL | 12 refills | Status: DC | PRN

## 2016-04-26 MED ORDER — TOPIRAMATE ER 100 MG PO SPRINKLE CAP24: 100.0000 mg | EXTENDED_RELEASE_CAPSULE | Freq: Every day | ORAL | 12 refills | Status: DC

## 2016-04-26 NOTE — Progress Notes (Addendum)
[facility]    Provider:  Dr Jaynee Eagles Referring Provider: Leighton Ruff, MD Primary Care Physician:  Gerrit Heck, MD  CC:  dizziness  HPI:  [name] is a 71 y.o. female here as a referral from Dr. Drema Dallas for dizziness. Medical history migraines(since  hypertension, hypercholesterolemia, spinal stenosis, diabetes, osteoarthritis and polyarthropathy, trochanteric bursitis, vitamin D deficiency, glaucoma, rheumatoid arthritis, spondylolisthesis.October 17 she woke up with severe spinning, nausea. Then it happened again January 29th worsening, she can incapacitated with a headache for 3 days. She has headaches at the temples with light sensitivity, nausea, pounding/throbbing, feels like her eyes are going to pop out. She has daily headache. She is taking 6-8 extra strength excedrin every day. Worse in the morning and at night. Headaches wake her up. She has gained 14 pounds since July. Blurry vision and vertigo. Severe, continuous, daily headaches. She has neck pain.No other focal neurologic deficits, associated symptoms, inciting events or modifiable factors.  Reviewed notes, labs and imaging from outside physicians, which showed:  CT head showed No acute intracranial abnormalities including mass lesion or mass effect, hydrocephalus, extra-axial fluid collection, midline shift, hemorrhage, or acute infarction, large ischemic events (personally reviewed images)    Review of Systems: Patient complains of symptoms per HPI as well as the following symptoms: headaches. Pertinent negatives per HPI. All others negative.   Social History   Social History  . Marital status: Widowed    Spouse name: N/A  . Number of children: 4  . Years of education: 76   Occupational History  . Retired     Pharmacist, hospital   Social History Main Topics  . Smoking status: Never Smoker  . Smokeless tobacco: Never Used  . Alcohol use No  . Drug use: No  . Sexual activity:  Not on file   Other Topics Concern  . Not on file   Social History Narrative   Lives at home alone   Right-handed   Caffeine: sodas    Family History  Problem Relation Age of Onset  . Diabetes Mother   . Stroke Mother   . High blood pressure Father   . Stroke Father   . Diabetes Father     Past Medical History:  Diagnosis Date  . Diabetes mellitus without complication ([place])   . History of bronchitis   . History of pneumonia   . RA (rheumatoid arthritis) ([place])   . TIA (transient ischemic attack)   . Wears glasses     Past Surgical History:  Procedure Laterality Date  . APPENDECTOMY    . BACK SURGERY  2012, 2017  . BILATERAL CARPAL TUNNEL RELEASE    . CESAREAN SECTION     x3  . GLAUCOMA SURGERY    . KNEE ARTHROSCOPY Left   . TONSILLECTOMY      Current Outpatient Prescriptions  Medication Sig Dispense Refill  . cetirizine (ZYRTEC) 10 MG tablet Take 10 mg by mouth daily. For 3 days prior to Remicade infusion    . diclofenac (VOLTAREN) 75 MG EC tablet TAKE 1 TABLET BY MOUTH TWICE A DAY    . ezetimibe (ZETIA) 10 MG tablet Take 10 mg by mouth daily.    . fenofibrate 54 MG tablet Take 54 mg by mouth daily.    . folic acid (FOLVITE) 161 MCG tablet Take 1,600 mcg by mouth daily.    . Golimumab ([place] ARIA IV) Inject into the vein every 6 (six) weeks.    . methotrexate 50 MG/2ML injection Inject  20 mg into the vein every Tuesday.     Marland Kitchen oxyCODONE-acetaminophen (PERCOCET/ROXICET) 5-325 MG tablet Take 1-2 tablets by mouth every 4 (four) hours as needed for moderate pain. 50 tablet 0  . pioglitazone (ACTOS) 30 MG tablet Take 30 mg by mouth daily.    . ranitidine (ZANTAC) 75 MG tablet Take 75 mg by mouth daily. For 3 days prior to Remicade infusion    . ondansetron (ZOFRAN-ODT) 4 MG disintegrating tablet Take 1 tablet (4 mg total) by mouth every 8 (eight) hours as needed for nausea. 30 tablet 12  . SUMAtriptan (IMITREX) 100 MG tablet Take 1 tablet (100 mg total) by mouth once  as needed. May repeat in 2 hours if headache persists or recurs. 10 tablet 12  . Topiramate ER 100 MG CS24 Take 100 mg by mouth at bedtime. 30 each 12   No current facility-administered medications for this visit.     Allergies as of 04/26/2016 - Review Complete 04/26/2016  Allergen Reaction Noted  . Penicillins Itching and Swelling 07/12/2015    Vitals: BP (!) 144/61   Pulse (!) 57   Ht 5' 2"  (1.575 m)   Wt 200 lb 3.2 oz (90.8 kg)   BMI 36.62 kg/m  Last Weight:  Wt Readings from Last 1 Encounters:  04/26/16 200 lb 3.2 oz (90.8 kg)   Last Height:   Ht Readings from Last 1 Encounters:  04/26/16 5' 2"  (1.575 m)    Physical exam: Exam: Gen: NAD, conversant, well nourised, obese, well groomed                     CV: RRR, no MRG. No Carotid Bruits. No peripheral edema, warm, nontender Eyes: Conjunctivae clear without exudates or hemorrhage  Neuro: Detailed Neurologic Exam  Speech:    Speech is normal; fluent and spontaneous with normal comprehension.  Cognition:    The patient is oriented to person, place, and time;     recent and remote memory intact;     language fluent;     normal attention, concentration,     fund of knowledge Cranial Nerves:    The pupils are equal, round, and reactive to light. The fundi are normal and spontaneous venous pulsations are present. Visual fields are full to finger confrontation. Extraocular movements are intact. Trigeminal sensation is intact and the muscles of mastication are normal. The face is symmetric. The palate elevates in the midline. Hearing intact. Voice is normal. Shoulder shrug is normal. The tongue has normal motion without fasciculations.   Coordination:    Normal finger to nose and heel to shin. Normal rapid alternating movements.   Gait:    Heel-toe and tandem gait are normal.   Motor Observation:    No asymmetry, no atrophy, and no involuntary movements noted. Tone:    Normal muscle tone.    Posture:    Posture  is normal. normal erect    Strength:    Strength is V/V in the upper and lower limbs.      Sensation: intact to LT     Reflex Exam:  DTR's:    Deep tendon reflexes in the upper and lower extremities are normal bilaterally.   Toes:    The toes are downgoing bilaterally.   Clonus:    Clonus is absent.      Assessment/Plan:  71 year old with medication overuse headaches(She is taking 6-8 extra strength excedrin every day) and migraines/vestibular migraines.   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 They only thing to do is to stop the medication. Unfortunately in the timeframe after stopping at her headaches may get much worse. I will give her some medication to try to bridge that. She should significantly  improve with her chronic daily headaches after 2-4 weeks of being off her daily over-the-counter medications. Do not use these medications more than 2 times in a week.  Sleep evaluation: Morning and nocturnal headaches, tired during the day, gained weight: she declines Vestibular therapy  Migraines: Discussed migraine management including acute management and preventative management. We'll start patient on both. Discussed Topamax side effects especially teratogenicity do not get pregnant and use birth control.  Remember to drink plenty of fluid, eat healthy meals and do not skip any meals. Try to eat protein with a every meal and eat a healthy snack such as fruit or nuts in between meals. Try to keep a regular sleep-wake schedule and try to exercise daily, particularly in the form of walking, 20-30 minutes a day, if you can.   As far as your medications are concerned, I would like to suggest: - Stop daily over the counter med use (Tylenol, excedrin,  ibuprofen, alleve) Do not tak emore than 2-3x in one week - For the next 6 days take steroids (medrol).  -At onset of Migraine Imitrex. Please take one tablet at the onset of your headache. If it does not improve the symptoms please take one additional tablet. Do not take more then 2 tablets in 24hrs. Do not take use more then 2 to 3 days in a week. - Topiramate ER week one 57m, week two 533mand if no side effects can increase to 10080mTakes 4-6 weeks for medications to work.  As far as diagnostic testing: If headaches persist need MRI brain. Will check esr/crp however very unlikely temporal arteritis  Discussed; To prevent or relieve headaches, try the following:  Cool Compress. Lie down and place a cool compress on your head.   Avoid headache triggers. If certain foods or odors seem to have triggered your migraines in the past, avoid them. A headache diary might help you identify triggers.   Include physical activity in your daily routine. Try a daily walk or other moderate aerobic exercise.   Manage stress. Find healthy ways to cope with the stressors, such as delegating tasks on your to-do list.   Practice relaxation techniques. Try deep breathing, yoga, massage and visualization.   Eat regularly. Eating regularly scheduled meals and maintaining a healthy diet might help prevent headaches. Also, drink plenty of fluids.   Follow a regular sleep schedule. Sleep deprivation might contribute to headaches  Consider biofeedback. With this mind-body technique, you learn to control certain bodily functions - such as muscle tension, heart rate and blood pressure - to prevent headaches or reduce headache pain.    Proceed to emergency room if you experience new or worsening symptoms or symptoms do not resolve, if you have new neurologic symptoms or if headache is severe, or for any concerning symptom.   Cc: Dr. BarMarcy SirenD [facility] [id number] [phone] [phone]

## 2016-04-26 NOTE — Patient Instructions (Addendum)
Remember to drink plenty of fluid, eat healthy meals and do not skip any meals. Try to eat protein with a every meal and eat a healthy snack such as fruit or nuts in between meals. Try to keep a regular sleep-wake schedule and try to exercise daily, particularly in the form of walking, 20-30 minutes a day, if you can.   As far as your medications are concerned, I would like to suggest:  Topiramate ER: One week take 25mg , increase to 50mg  at night Medrol dosepak 6 days Imitrex: Please take one tablet at the onset of your headache. If it does not improve the symptoms please take one additional tablet. Do not take more then 2 tablets in 24hrs. Do not take use more then 2 to 3 times in a week.  I would like to see you back in 8-12 weeks, sooner if we need to. Please call us with any interim questions, concerns, problems, updates or refill requests.   Our phone number is [phone]. We also have an after hours call service for urgent matters and there is a physician on-call for urgent questions. For any emergencies you know to call 911 or go to the nearest emergency room  Methylprednisolone tablets What is this medicine? METHYLPREDNISOLONE (meth ill pred NISS oh lone) is a corticosteroid. It is commonly used to treat inflammation of the skin, joints, lungs, and other organs. Common conditions treated include asthma, allergies, and arthritis. It is also used for other conditions, such as blood disorders and diseases of the adrenal glands. This medicine may be used for other purposes; ask your health care provider or pharmacist if you have questions. COMMON BRAND NAME(S): Medrol, Medrol Dosepak What should I tell my health care provider before I take this medicine? They need to know if you have any of these conditions: -Cushing's syndrome -eye disease, vision problems -diabetes -glaucoma -heart disease -high blood pressure -infection (especially a virus infection such as chickenpox, cold sores,  or herpes) -liver disease -mental illness -myasthenia gravis -osteoporosis -recently received or scheduled to receive a vaccine -seizures -stomach or intestine problems -thyroid disease -an unusual or allergic reaction to lactose, methylprednisolone, other medicines, foods, dyes, or preservatives -pregnant or trying to get pregnant -breast-feeding How should I use this medicine? Take this medicine by mouth with a glass of water. Follow the directions on the prescription label. Take this medicine with food. If you are taking this medicine once a day, take it in the morning. Do not take it more often than directed. Do not suddenly stop taking your medicine because you may develop a severe reaction. Your doctor will tell you how much medicine to take. If your doctor wants you to stop the medicine, the dose may be slowly lowered over time to avoid any side effects. Talk to your pediatrician regarding the use of this medicine in children. Special care may be needed. Overdosage: If you think you have taken too much of this medicine contact a poison control center or emergency room at once. NOTE: This medicine is only for you. Do not share this medicine with others. What if I miss a dose? If you miss a dose, take it as soon as you can. If it is almost time for your next dose, talk to your doctor or health care professional. You may need to miss a dose or take an extra dose. Do not take double or extra doses without advice. What may interact with this medicine? Do not take this medicine with any  of the following medications: -alefacept -echinacea -live virus vaccines -metyrapone -mifepristone This medicine may also interact with the following medications: -amphotericin B -aspirin and aspirin-like medicines -certain antibiotics like erythromycin, clarithromycin, troleandomycin -certain medicines for diabetes -certain medicines for fungal infections like ketoconazole -certain medicines for  seizures like carbamazepine, phenobarbital, phenytoin -certain medicines that treat or prevent blood clots like warfarin -cholestyramine -cyclosporine -digoxin -diuretics -female hormones, like estrogens and birth control pills -isoniazid -NSAIDs, medicines for pain inflammation, like ibuprofen or naproxen -other medicines for myasthenia gravis -rifampin -vaccines This list may not describe all possible interactions. Give your health care provider a list of all the medicines, herbs, non-prescription drugs, or dietary supplements you use. Also tell them if you smoke, drink alcohol, or use illegal drugs. Some items may interact with your medicine. What should I watch for while using this medicine? Tell your doctor or healthcare professional if your symptoms do not start to get better or if they get worse. Do not stop taking except on your doctor's advice. You may develop a severe reaction. Your doctor will tell you how much medicine to take. This medicine may increase your risk of getting an infection. Tell your doctor or health care professional if you are around anyone with measles or chickenpox, or if you develop sores or blisters that do not heal properly. This medicine may affect blood sugar levels. If you have diabetes, check with your doctor or health care professional before you change your diet or the dose of your diabetic medicine. Tell your doctor or health care professional right away if you have any change in your eyesight. Using this medicine for a long time may increase your risk of low bone mass. Talk to your doctor about bone health. What side effects may I notice from receiving this medicine? Side effects that you should report to your doctor or health care professional as soon as possible: -allergic reactions like skin rash, itching or hives, swelling of the face, lips, or tongue -bloody or tarry stools -changes in vision -hallucination, loss of contact with reality -muscle  cramps -muscle pain -palpitations -signs and symptoms of high blood sugar such as dizziness; dry mouth; dry skin; fruity breath; nausea; stomach pain; increased hunger or thirst; increased urination -signs and symptoms of infection like fever or chills; cough; sore throat; pain or trouble passing urine -trouble passing urine or change in the amount of urine Side effects that usually do not require medical attention (report to your doctor or health care professional if they continue or are bothersome): -changes in emotions or mood -constipation -diarrhea -excessive hair growth on the face or body -headache -nausea, vomiting -trouble sleeping -weight gain This list may not describe all possible side effects. Call your doctor for medical advice about side effects. You may report side effects to FDA at 1-800-FDA-1088. Where should I keep my medicine? Keep out of the reach of children. Store at room temperature between 20 and 25 degrees C (68 and 77 degrees F). Throw away any unused medicine after the expiration date. NOTE: This sheet is a summary. It may not cover all possible information. If you have questions about this medicine, talk to your doctor, pharmacist, or health care provider.  2018 Elsevier/Gold Standard (2015-03-30 15:53:30)   Sumatriptan tablets What is this medicine? SUMATRIPTAN (soo ma TRIP tan) is used to treat migraines with or without aura. An aura is a strange feeling or visual disturbance that warns you of an attack. It is not used to prevent migraines.  This medicine may be used for other purposes; ask your health care provider or pharmacist if you have questions. COMMON BRAND NAME(S): Imitrex, Migraine Pack What should I tell my health care provider before I take this medicine? They need to know if you have any of these conditions: -circulation problems in fingers and toes -diabetes -heart disease -high blood pressure -high cholesterol -history of irregular  heartbeat -history of stroke -kidney disease -liver disease -postmenopausal or surgical removal of uterus and ovaries -seizures -smoke tobacco -stomach or intestine problems -an unusual or allergic reaction to sumatriptan, other medicines, foods, dyes, or preservatives -pregnant or trying to get pregnant -breast-feeding How should I use this medicine? Take this medicine by mouth with a glass of water. Follow the directions on the prescription label. This medicine is taken at the first symptoms of a migraine. It is not for everyday use. If your migraine headache returns after one dose, you can take another dose as directed. You must leave at least 2 hours between doses, and do not take more than 100 mg as a single dose. Do not take more than 200 mg total in any 24 hour period. If there is no improvement at all after the first dose, do not take a second dose without talking to your doctor or health care professional. Do not take your medicine more often than directed. Talk to your pediatrician regarding the use of this medicine in children. Special care may be needed. Overdosage: If you think you have taken too much of this medicine contact a poison control center or emergency room at once. NOTE: This medicine is only for you. Do not share this medicine with others. What if I miss a dose? This does not apply; this medicine is not for regular use. What may interact with this medicine? Do not take this medicine with any of the following medicines: -cocaine -ergot alkaloids like dihydroergotamine, ergonovine, ergotamine, methylergonovine -feverfew -MAOIs like Carbex, Eldepryl, Marplan, Nardil, and Parnate -other medicines for migraine headache like almotriptan, eletriptan, frovatriptan, naratriptan, rizatriptan, zolmitriptan -tryptophan This medicine may also interact with the following medications: -certain medicines for depression, anxiety, or psychotic disturbances This list may not  describe all possible interactions. Give your health care provider a list of all the medicines, herbs, non-prescription drugs, or dietary supplements you use. Also tell them if you smoke, drink alcohol, or use illegal drugs. Some items may interact with your medicine. What should I watch for while using this medicine? Only take this medicine for a migraine headache. Take it if you get warning symptoms or at the start of a migraine attack. It is not for regular use to prevent migraine attacks. You may get drowsy or dizzy. Do not drive, use machinery, or do anything that needs mental alertness until you know how this medicine affects you. To reduce dizzy or fainting spells, do not sit or stand up quickly, especially if you are an older patient. Alcohol can increase drowsiness, dizziness and flushing. Avoid alcoholic drinks. Smoking cigarettes may increase the risk of heart-related side effects from using this medicine. If you take migraine medicines for 10 or more days a month, your migraines may get worse. Keep a diary of headache days and medicine use. Contact your healthcare professional if your migraine attacks occur more frequently. What side effects may I notice from receiving this medicine? Side effects that you should report to your doctor or health care professional as soon as possible: -allergic reactions like skin rash, itching or hives, swelling  of the face, lips, or tongue -bloody or watery diarrhea -hallucination, loss of contact with reality -pain, tingling, numbness in the face, hands, or feet -seizures -signs and symptoms of a blood clot such as breathing problems; changes in vision; chest pain; severe, sudden headache; pain, swelling, warmth in the leg; trouble speaking; sudden numbness or weakness of the face, arm, or leg -signs and symptoms of a dangerous change in heartbeat or heart rhythm like chest pain; dizziness; fast or irregular heartbeat; palpitations, feeling faint or  lightheaded; falls; breathing problems -signs and symptoms of a stroke like changes in vision; confusion; trouble speaking or understanding; severe headaches; sudden numbness or weakness of the face, arm, or leg; trouble walking; dizziness; loss of balance or coordination -stomach pain Side effects that usually do not require medical attention (report to your doctor or health care professional if they continue or are bothersome): -changes in taste -facial flushing -headache -muscle cramps -muscle pain -nausea, vomiting -weak or tired This list may not describe all possible side effects. Call your doctor for medical advice about side effects. You may report side effects to FDA at 1-800-FDA-1088. Where should I keep my medicine? Keep out of the reach of children. Store at room temperature between 2 and 30 degrees C (36 and 86 degrees F). Throw away any unused medicine after the expiration date. NOTE: This sheet is a summary. It may not cover all possible information. If you have questions about this medicine, talk to your doctor, pharmacist, or health care provider.  2018 Elsevier/Gold Standard (2015-02-23 12:38:23)   Topiramate extended-release capsules What is this medicine? TOPIRAMATE (toe PYRE a mate) is used to treat seizures in adults or children with epilepsy. It is also used for the prevention of migraine headaches. This medicine may be used for other purposes; ask your health care provider or pharmacist if you have questions. COMMON BRAND NAME(S): Trokendi XR What should I tell my health care provider before I take this medicine? They need to know if you have any of these conditions: -cirrhosis of the liver or liver disease -diarrhea -glaucoma -kidney stones or kidney disease -lung disease like asthma, obstructive pulmonary disease, emphysema -metabolic acidosis -on a ketogenic diet -scheduled for surgery or a procedure -suicidal thoughts, plans, or attempt; a previous  suicide attempt by you or a family member -an unusual or allergic reaction to topiramate, other medicines, foods, dyes, or preservatives -pregnant or trying to get pregnant -breast-feeding How should I use this medicine? Take this medicine by mouth with a glass of water. Follow the directions on the prescription label. Trokendi XR capsules must be swallowed whole. Do not sprinkle on food, break, crush, dissolve, or chew. Qudexy XR capsules may be swallowed whole or opened and sprinkled on a small amount of soft food. This mixture must be swallowed immediately. Do not chew or store mixture for later use. You may take this medicine with meals. Take your medicine at regular intervals. Do not take it more often than directed. Talk to your pediatrician regarding the use of this medicine in children. Special care may be needed. While Trokendi XR may be prescribed for children as young as 6 years and Qudexy XR may be prescribed for children as young as 2 years for selected conditions, precautions do apply. Overdosage: If you think you have taken too much of this medicine contact a poison control center or emergency room at once. NOTE: This medicine is only for you. Do not share this medicine with others. What if  I miss a dose? If you miss a dose, take it as soon as you can. If it is almost time for your next dose, take only that dose. Do not take double or extra doses. What may interact with this medicine? Do not take this medicine with any of the following medications: -probenecid This medicine may also interact with the following medications: -acetazolamide -alcohol -amitriptyline -birth control pills -digoxin -hydrochlorothiazide -lithium -medicines for pain, sleep, or muscle relaxation -metformin -methazolamide -other seizure or epilepsy medicines -pioglitazone -risperidone This list may not describe all possible interactions. Give your health care provider a list of all the medicines,  herbs, non-prescription drugs, or dietary supplements you use. Also tell them if you smoke, drink alcohol, or use illegal drugs. Some items may interact with your medicine. What should I watch for while using this medicine? Visit your doctor or health care professional for regular checks on your progress. Do not stop taking this medicine suddenly. This increases the risk of seizures if you are using this medicine to control epilepsy. Wear a medical identification bracelet or chain to say you have epilepsy or seizures, and carry a card that lists all your medicines. This medicine can decrease sweating and increase your body temperature. Watch for signs of deceased sweating or fever, especially in children. Avoid extreme heat, hot baths, and saunas. Be careful about exercising, especially in hot weather. Contact your health care provider right away if you notice a fever or decrease in sweating. You should drink plenty of fluids while taking this medicine. If you have had kidney stones in the past, this will help to reduce your chances of forming kidney stones. If you have stomach pain, with nausea or vomiting and yellowing of your eyes or skin, call your doctor immediately. You may get drowsy, dizzy, or have blurred vision. Do not drive, use machinery, or do anything that needs mental alertness until you know how this medicine affects you. To reduce dizziness, do not sit or stand up quickly, especially if you are an older patient. Alcohol can increase drowsiness and dizziness. Avoid alcoholic drinks. Do not drink alcohol for 6 hours before or 6 hours after taking Trokendi XR. If you notice blurred vision, eye pain, or other eye problems, seek medical attention at once for an eye exam. The use of this medicine may increase the chance of suicidal thoughts or actions. Pay special attention to how you are responding while on this medicine. Any worsening of mood, or thoughts of suicide or dying should be reported  to your health care professional right away. This medicine may increase the chance of developing metabolic acidosis. If left untreated, this can cause kidney stones, bone disease, or slowed growth in children. Symptoms include breathing fast, fatigue, loss of appetite, irregular heartbeat, or loss of consciousness. Call your doctor immediately if you experience any of these side effects. Also, tell your doctor about any surgery you plan on having while taking this medicine since this may increase your risk for metabolic acidosis. Birth control pills may not work properly while you are taking this medicine. Talk to your doctor about using an extra method of birth control. Women who become pregnant while using this medicine may enroll in the [place] [employer] Antiepileptic Drug Pregnancy Registry by calling [phone]. This registry collects information about the safety of antiepileptic drug use during pregnancy. What side effects may I notice from receiving this medicine? Side effects that you should report to your doctor or health care professional as soon  as possible: -allergic reactions like skin rash, itching or hives, swelling of the face, lips, or tongue -decreased sweating and/or rise in body temperature -depression -difficulty breathing, fast or irregular breathing patterns -difficulty speaking -difficulty walking or controlling muscle movements -hearing impairment -redness, blistering, peeling or loosening of the skin, including inside the mouth -tingling, pain or numbness in the hands or feet -unusually weak or tired -worsening of mood, thoughts or actions of suicide or dying Side effects that usually do not require medical attention (report to your doctor or health care professional if they continue or are bothersome): -altered taste -back pain, joint or muscle aches and pains -diarrhea, or constipation -headache -loss of appetite -nausea -stomach upset, indigestion -tremors This  list may not describe all possible side effects. Call your doctor for medical advice about side effects. You may report side effects to FDA at 1-800-FDA-1088. Where should I keep my medicine? Keep out of the reach of children. Store at room temperature between 15 and 30 degrees C (59 and 86 degrees F) in a tightly closed container. Protect from moisture. Throw away any unused medicine after the expiration date. NOTE: This sheet is a summary. It may not cover all possible information. If you have questions about this medicine, talk to your doctor, pharmacist, or health care provider.  2018 Elsevier/Gold Standard (2015-05-12 12:33:11)

## 2016-04-28 NOTE — Addendum Note (Signed)
Addended by: Naomie Dean B on: 04/28/2016 09:08 AM   Modules accepted: Orders

## 2016-05-30 ENCOUNTER — Telehealth: Payer: Self-pay | Admitting: Neurology

## 2016-05-30 NOTE — Telephone Encounter (Signed)
Pt said she needs RX for Qudexy 50mg  sent to [facility]. Pt said she started 25mg  x 1 wk and then increased to 50mg  3/31. Says she has 8 pills of 50mg  left and #21 of 25mg . She is wanting to know if she can take 2pills of 25mg  until gone. Please call

## 2016-05-31 MED ORDER — TOPIRAMATE ER 50 MG PO SPRINKLE CAP24: 50.0000 mg | EXTENDED_RELEASE_CAPSULE | Freq: Every day | ORAL | 11 refills | Status: DC

## 2016-05-31 NOTE — Addendum Note (Signed)
Addended by: Donnelly Angelica on: 05/31/2016 09:49 AM   Modules accepted: Orders

## 2016-05-31 NOTE — Telephone Encounter (Signed)
E-scribed new rx for 50 mg caps which may require PA. Pt may use two 25 mg caps until gone. Notified her of this via TC. May call back w/ any questions/concerns.

## 2016-06-03 NOTE — Telephone Encounter (Signed)
PA initiated via Cover My Meds 

## 2016-06-04 NOTE — Telephone Encounter (Signed)
Request Reference Number: [id number]. TOPIRAMATE CAP ER 50MG  is approved through 02/03/2017. For further questions, call [phone].

## 2016-07-08 ENCOUNTER — Encounter: Payer: Self-pay | Admitting: Adult Health

## 2016-07-08 ENCOUNTER — Ambulatory Visit (INDEPENDENT_AMBULATORY_CARE_PROVIDER_SITE_OTHER): Payer: Medicare Other | Admitting: Adult Health

## 2016-07-08 VITALS — BP 128/70 | HR 52 | Ht 62.0 in | Wt 199.6 lb

## 2016-07-08 DIAGNOSIS — G43109 Migraine with aura, not intractable, without status migrainosus: Secondary | ICD-10-CM | POA: Diagnosis not present

## 2016-07-08 DIAGNOSIS — G43809 Other migraine, not intractable, without status migrainosus: Secondary | ICD-10-CM

## 2016-07-08 NOTE — Patient Instructions (Signed)
Continue Toopamax ER  If your symptoms worsen or you develop new symptoms please let us know.

## 2016-07-08 NOTE — Progress Notes (Signed)
PATIENT: [name] DOB: 09-18-1945  REASON FOR VISIT: follow up HISTORY FROM: patient  HISTORY OF PRESENT ILLNESS:  Today 07/08/2016 this Bureau is a 71 year old female with a history of vestibular migraines. She returns today for an evaluation. She is currently on Topamax extended release she reports this has been beneficial for her headaches. She states that her headache severity and frequency has improved. She states that since her appointment in March she has had approximately 3-4 headaches that has required her to take over-the-counter medication. She has not tried Imitrex yet. She states that she has not had a recurrent headache like she had prior to Topamax. She does note that at times she has had an "out of body experience" and feels as if she may have an episode but the headache never comes. She also states that when she was shoe shopping and as she was looking up she begin to get dizzy but then quickly stopped doing this and  It improved. She denies any new neurological symptoms. Returns today for an evaluation.   HISTORY 04/26/16 ( Copied from Dr. Trevor Mace notes): [name] is a 71 y.o. female here as a referral from Dr. Zachery Dauer for dizziness. Medical history migraines(since  hypertension, hypercholesterolemia, spinal stenosis, diabetes, osteoarthritis and polyarthropathy, trochanteric bursitis, vitamin D deficiency, glaucoma, rheumatoid arthritis, spondylolisthesis.October 17 she woke up with severe spinning, nausea. Then it happened again January 29th worsening, she can incapacitated with a headache for 3 days. She has headaches at the temples with light sensitivity, nausea, pounding/throbbing, feels like her eyes are going to pop out. She has daily headache. She is taking 6-8 extra strength excedrin every day. Worse in the morning and at night. Headaches wake her up. She has gained 14 pounds since July. Blurry vision and vertigo. Severe, continuous, daily headaches.  She has neck pain.No other focal neurologic deficits, associated symptoms, inciting events or modifiable factors.  Reviewed notes, labs and imaging from outside physicians, which showed:  CT head showed No acute intracranial abnormalities including mass lesion or mass effect, hydrocephalus, extra-axial fluid collection, midline shift, hemorrhage, or acute infarction, large ischemic events (personally reviewed images)    REVIEW OF SYSTEMS: Out of a complete 14 system review of symptoms, the patient complains only of the following symptoms, and all other reviewed systems are negative.  Joint pain, joint swelling, back pain, aching muscles, muscle cramps, walking difficulty  ALLERGIES: Allergies  Allergen Reactions  . Penicillins Itching and Swelling    Has patient had a PCN reaction causing immediate rash, facial/tongue/throat swelling, SOB or lightheadedness with hypotension: Yes Has patient had a PCN reaction causing severe rash involving mucus membranes or skin necrosis: No Has patient had a PCN reaction that required hospitalization No Has patient had a PCN reaction occurring within the last 10 years: No If all of the above answers are "NO", then may proceed with Cephalosporin use.     HOME MEDICATIONS: Outpatient Medications Prior to Visit  Medication Sig Dispense Refill  . cetirizine (ZYRTEC) 10 MG tablet Take 10 mg by mouth daily. For 3 days prior to Remicade infusion    . diclofenac (VOLTAREN) 75 MG EC tablet TAKE 1 TABLET BY MOUTH TWICE A DAY    . ezetimibe (ZETIA) 10 MG tablet Take 10 mg by mouth daily.    . fenofibrate 54 MG tablet Take 54 mg by mouth daily.    . folic acid (FOLVITE) 800 MCG tablet Take 1,600 mcg by mouth daily.    Marland Kitchen  Golimumab (SIMPONI ARIA IV) Inject into the vein every 6 (six) weeks.    . methotrexate 50 MG/2ML injection Inject 20 mg into the vein every Tuesday.     . ondansetron (ZOFRAN-ODT) 4 MG disintegrating tablet Take 1 tablet (4 mg total) by  mouth every 8 (eight) hours as needed for nausea. 30 tablet 12  . oxyCODONE-acetaminophen (PERCOCET/ROXICET) 5-325 MG tablet Take 1-2 tablets by mouth every 4 (four) hours as needed for moderate pain. 50 tablet 0  . pioglitazone (ACTOS) 30 MG tablet Take 30 mg by mouth daily.    . ranitidine (ZANTAC) 75 MG tablet Take 75 mg by mouth daily. For 3 days prior to Remicade infusion    . SUMAtriptan (IMITREX) 100 MG tablet Take 1 tablet (100 mg total) by mouth once as needed. May repeat in 2 hours if headache persists or recurs. 10 tablet 12  . Topiramate ER 100 MG CS24 Take 100 mg by mouth at bedtime. 30 each 12  . Topiramate ER 50 MG CS24 Take 50 mg by mouth at bedtime. May increase to 100 mg by mouth at bedtime as tolerated. 60 each 11   No facility-administered medications prior to visit.     PAST MEDICAL HISTORY: Past Medical History:  Diagnosis Date  . Diabetes mellitus without complication (HCC)   . History of bronchitis   . History of pneumonia   . RA (rheumatoid arthritis) (HCC)   . TIA (transient ischemic attack)   . Wears glasses     PAST SURGICAL HISTORY: Past Surgical History:  Procedure Laterality Date  . APPENDECTOMY    . BACK SURGERY  2012, 2017  . BILATERAL CARPAL TUNNEL RELEASE    . CESAREAN SECTION     x3  . GLAUCOMA SURGERY    . KNEE ARTHROSCOPY Left   . TONSILLECTOMY      FAMILY HISTORY: Family History  Problem Relation Age of Onset  . Diabetes Mother   . Stroke Mother   . High blood pressure Father   . Stroke Father   . Diabetes Father     SOCIAL HISTORY: Social History   Social History  . Marital status: Widowed    Spouse name: N/A  . Number of children: 4  . Years of education: 58   Occupational History  . Retired     Runner, broadcasting/film/video   Social History Main Topics  . Smoking status: Never Smoker  . Smokeless tobacco: Never Used  . Alcohol use No  . Drug use: No  . Sexual activity: Not on file   Other Topics Concern  . Not on file   Social  History Narrative   Lives at home alone   Right-handed   Caffeine: sodas      PHYSICAL EXAM  Vitals:   07/08/16 1500  BP: 128/70  Pulse: (!) 52  Weight: 199 lb 9.6 oz (90.5 kg)  Height: 5\' 2"  (1.575 m)   Body mass index is 36.51 kg/m.  Generalized: Well developed, in no acute distress   Neurological examination  Mentation: Alert oriented to time, place, history taking. Follows all commands speech and language fluent Cranial nerve II-XII: Pupils were equal round reactive to light. Extraocular movements were full, visual field were full on confrontational test. Facial sensation and strength were normal. Uvula tongue midline. Head turning and shoulder shrug  were normal and symmetric. Motor: The motor testing reveals 5 over 5 strength of all 4 extremities. Good symmetric motor tone is noted throughout.  Sensory: Sensory testing is  intact to soft touch on all 4 extremities. No evidence of extinction is noted.  Coordination: Cerebellar testing reveals good finger-nose-finger and heel-to-shin bilaterally.  Gait and station: Difficulty with gait due to back pain and rheumatoid arthritis..  Reflexes: Deep tendon reflexes are symmetric and normal bilaterally.   DIAGNOSTIC DATA (LABS, IMAGING, TESTING) - I reviewed patient records, labs, notes, testing and imaging myself where available.  Lab Results  Component Value Date   WBC 11.6 (H) 11/23/2015   HGB 10.4 (L) 11/23/2015   HCT 32.4 (L) 11/23/2015   MCV 98.8 11/23/2015   PLT 303 11/23/2015      Component Value Date/Time   NA 139 11/23/2015 0102   K 4.0 11/23/2015 0102   CL 106 11/23/2015 0102   CO2 26 11/23/2015 0102   GLUCOSE 164 (H) 11/23/2015 0102   BUN 30 (H) 11/23/2015 0102   CREATININE 0.99 11/23/2015 0102   CALCIUM 10.3 11/23/2015 0102   PROT 7.0 02/25/2007 0520   ALBUMIN 3.6 02/25/2007 0520   AST 17 02/25/2007 0520   ALT 17 02/25/2007 0520   ALKPHOS 68 02/25/2007 0520   BILITOT 0.7 02/25/2007 0520   GFRNONAA  57 (L) 11/23/2015 0102   GFRAA >60 11/23/2015 0102    Lab Results  Component Value Date   HGBA1C 6.1 (H) 07/13/2015      ASSESSMENT AND PLAN 71 y.o. year old female  has a past medical history of Diabetes mellitus without complication (HCC); History of bronchitis; History of pneumonia; RA (rheumatoid arthritis) (HCC); TIA (transient ischemic attack); and Wears glasses. here with:  1. Vestibular migraines  Overall the patient is doing well. She will continue on Topamax extended release 50 mg daily. Advised that if her headache frequency or severity increases she should let us know. She will follow-up in 6 months or sooner if needed.  I spent 15 minutes with the patient. 50% of this time was spent counseling the patient on her diagnosis and medication.     Butch Penny, MSN, NP-C 07/08/2016, 2:59 PM [facility] [address], [address] [place], [place] [address] [phone]

## 2016-08-17 NOTE — Progress Notes (Signed)
Personally  participated in, made any corrections needed, and agree with history, physical, neuro exam,assessment and plan as stated.     Damon Hargrove, MD [facility]     

## 2016-08-29 ENCOUNTER — Other Ambulatory Visit: Payer: Self-pay | Admitting: Neurological Surgery

## 2016-08-29 DIAGNOSIS — M546 Pain in thoracic spine: Secondary | ICD-10-CM

## 2016-08-29 DIAGNOSIS — M4317 Spondylolisthesis, lumbosacral region: Secondary | ICD-10-CM

## 2016-09-05 ENCOUNTER — Other Ambulatory Visit: Payer: Medicare Other

## 2016-09-13 ENCOUNTER — Other Ambulatory Visit: Payer: Medicare Other

## 2016-09-25 ENCOUNTER — Other Ambulatory Visit: Payer: Self-pay | Admitting: Neurological Surgery

## 2016-09-25 DIAGNOSIS — M4317 Spondylolisthesis, lumbosacral region: Secondary | ICD-10-CM

## 2016-09-26 ENCOUNTER — Other Ambulatory Visit: Payer: Medicare Other

## 2016-09-27 ENCOUNTER — Ambulatory Visit
Admission: RE | Admit: 2016-09-27 | Discharge: 2016-09-27 | Disposition: A | Discharge status: Home / Self Care | Payer: Medicare Other | Source: Ambulatory Visit | Attending: Neurological Surgery | Admitting: Neurological Surgery

## 2016-09-27 DIAGNOSIS — M4317 Spondylolisthesis, lumbosacral region: Secondary | ICD-10-CM

## 2016-10-01 ENCOUNTER — Ambulatory Visit
Admission: RE | Admit: 2016-10-01 | Discharge: 2016-10-01 | Disposition: A | Discharge status: Home / Self Care | Payer: Medicare Other | Source: Ambulatory Visit | Attending: Neurological Surgery | Admitting: Neurological Surgery

## 2016-10-16 ENCOUNTER — Ambulatory Visit (INDEPENDENT_AMBULATORY_CARE_PROVIDER_SITE_OTHER): Payer: Medicare Other

## 2016-10-16 ENCOUNTER — Ambulatory Visit (INDEPENDENT_AMBULATORY_CARE_PROVIDER_SITE_OTHER): Payer: Medicare Other | Admitting: Orthopaedic Surgery

## 2016-10-16 DIAGNOSIS — M25561 Pain in right knee: Secondary | ICD-10-CM | POA: Diagnosis not present

## 2016-10-16 DIAGNOSIS — M25551 Pain in right hip: Secondary | ICD-10-CM

## 2016-10-16 MED ORDER — METHYLPREDNISOLONE ACETATE 40 MG/ML IJ SUSP
40.0000 mg | INTRAMUSCULAR | Status: AC | PRN
  Administered 2016-10-16: 40 mg via INTRA_ARTICULAR

## 2016-10-16 MED ORDER — LIDOCAINE HCL 1 % IJ SOLN
3.0000 mL | INTRAMUSCULAR | Status: AC | PRN
  Administered 2016-10-16: 3 mL

## 2016-10-16 NOTE — Progress Notes (Signed)
Office Visit Note   Patient: [name]           Date of Birth: September 11, 1945           MRN: [id number] Visit Date: 10/16/2016              Requested by: Juluis Rainier, MD [address] [place], [place] [address] PCP: Juluis Rainier, MD   Assessment & Plan: Visit Diagnoses:  1. Pain in right hip   2. Pain of right hip joint   3. Acute pain of right knee     Plan: X-rays again do show joint space narrowing on the right side but is not bone rubbing on bone. I do feel that she would benefit from an intra-articular steroid injection in her right hip because of would be diagnostic and therapeutic. Also provided a steroid injection in the office today on her right trochanteric area her right knee synovitis get her to feel better overall. I'll see her back in 3 weeks in the interim she'll had an injection in her right hip joint by Dr. Alvester Morin our physiatrist. All questions were encouraged and answered.  Follow-Up Instructions: Return in about 3 weeks (around 11/06/2016).   Orders:  Orders Placed This Encounter  Procedures  . Large Joint Injection/Arthrocentesis  . Large Joint Injection/Arthrocentesis  . XR HIP UNILAT W OR W/O PELVIS 1V RIGHT   No orders of the defined types were placed in this encounter.     Procedures: Large Joint Inj Date/Time: 10/16/2016 2:55 PM Performed by: Kathryne Hitch Authorized by: Doneen Poisson Y   Location:  Knee Site:  R knee Ultrasound Guidance: No   Fluoroscopic Guidance: No   Arthrogram: No   Medications:  3 mL lidocaine 1 %; 40 mg methylPREDNISolone acetate 40 MG/ML Large Joint Inj Date/Time: 10/16/2016 2:55 PM Performed by: Kathryne Hitch Authorized by: Kathryne Hitch   Location:  Hip Site:  R greater trochanter Ultrasound Guidance: No   Fluoroscopic Guidance: No   Arthrogram: No   Medications:  3 mL lidocaine 1 %; 40 mg methylPREDNISolone acetate 40 MG/ML     Clinical Data: No  additional findings.   Subjective: No chief complaint on file. Patient comes in his referral from Dr. Marikay Alar from [facility] to evaluate severe right hip pain. However she has significant lumbar spine issues as well. Her pain she describes is significant in her right groin area but also on the side of her right hip down her lateral thigh on the right side of her anterior down the right side at her right knee. Is becoming debilitating for her. Is 10 out of 10. It is detrimentally affecting her activities daily living, her quality of life, her mobility.  HPI  Review of Systems She currently denies any headache, chest pain, shortness of breath, fever, chills, nausea, vomiting.  Objective: Vital Signs: There were no vitals taken for this visit.  Physical Exam She is alert and 3 and in no acute distress but obvious discomfort Ortho Exam Examination of her left hip shows a normal hip exam. Examination of her right hip does show some pain on the extremes of rotation. I can rotate her hip around his heart is helped some of her pain is more trochanteric related. She does have evidence of trochanteric bursitis on exam she's having some knee pain as well and I think this is referred from her back and her right hip. Specialty Comments:  No specialty comments available.  Imaging: Xr Hip Unilat W Or W/o Pelvis 1v Right  Result Date: 10/16/2016 An AP pelvis and a lateral of her right hip does show mild-to-moderate arthritic changes with just joint space narrowing comparing the right left hips. It is not severe arthritis but I would say more mild to moderate. There is no significant sclerotic changes and no para-articular osteophytes.    PMFS History: Patient Active Problem List   Diagnosis Date Noted  . S/P lumbar spinal fusion 07/21/2015   Past Medical History:  Diagnosis Date  . Diabetes mellitus without complication (HCC)   . History of bronchitis   . History of pneumonia     . RA (rheumatoid arthritis) (HCC)   . TIA (transient ischemic attack)   . Wears glasses     Family History  Problem Relation Age of Onset  . Diabetes Mother   . Stroke Mother   . High blood pressure Father   . Stroke Father   . Diabetes Father     Past Surgical History:  Procedure Laterality Date  . APPENDECTOMY    . BACK SURGERY  2012, 2017  . BILATERAL CARPAL TUNNEL RELEASE    . CESAREAN SECTION     x3  . GLAUCOMA SURGERY    . KNEE ARTHROSCOPY Left   . TONSILLECTOMY     Social History   Occupational History  . Retired     Runner, broadcasting/film/video   Social History Main Topics  . Smoking status: Never Smoker  . Smokeless tobacco: Never Used  . Alcohol use No  . Drug use: No  . Sexual activity: Not on file

## 2016-10-17 ENCOUNTER — Other Ambulatory Visit (INDEPENDENT_AMBULATORY_CARE_PROVIDER_SITE_OTHER): Payer: Self-pay

## 2016-10-17 DIAGNOSIS — M25551 Pain in right hip: Secondary | ICD-10-CM

## 2016-10-29 ENCOUNTER — Encounter (INDEPENDENT_AMBULATORY_CARE_PROVIDER_SITE_OTHER): Payer: Self-pay | Admitting: Physical Medicine and Rehabilitation

## 2016-10-29 ENCOUNTER — Ambulatory Visit (INDEPENDENT_AMBULATORY_CARE_PROVIDER_SITE_OTHER): Payer: Medicare Other

## 2016-10-29 ENCOUNTER — Ambulatory Visit (INDEPENDENT_AMBULATORY_CARE_PROVIDER_SITE_OTHER): Payer: Medicare Other | Admitting: Physical Medicine and Rehabilitation

## 2016-10-29 DIAGNOSIS — M25551 Pain in right hip: Secondary | ICD-10-CM | POA: Diagnosis not present

## 2016-10-29 NOTE — Patient Instructions (Signed)

## 2016-10-29 NOTE — Progress Notes (Deleted)
Right hip and groin pain. Radiates down right leg to foot. Constant pain. Difficulty driving due to the pain.

## 2016-10-29 NOTE — Progress Notes (Signed)
[name] - 71 y.o. female MRN [id number]  Date of birth: Apr 28, 1945  Office Visit Note: Visit Date: 10/29/2016 PCP: Juluis Rainier, MD Referred by: Juluis Rainier, MD  Subjective: Chief Complaint  Patient presents with  . Right Hip - Pain   HPI: [name] is a very pleasant but complicated 71 year old female in terms of her back and hip pain. She is followed by Dr. Marikay Alar in neurosurgery for her back. She's had 2 prior lumbar surgeries. Dr. Yetta Barre evidently felt like a lot of her pain recently was coming from her hip. She was referred to Dr. Magnus Ivan in our office. Dr. Magnus Ivan did obtain x-rays that did show some narrowing of the joint space on the right. He also felt like her pain may be related to greater trochanteric bursitis. He did complete bursa injection as well as knee intra-articular injection at that office visit. The patient does not report much relief with those injections. His report pain radiating down the right leg sometimes to the foot. She has a lot of pain into the knee. Her pain is constant. She really is limited in what she can do at this point is really becoming somewhat depressed and discouraged with the amount of pain that she's having.    ROS Otherwise per HPI.  Assessment & Plan: Visit Diagnoses:  1. Pain in right hip     Plan: No additional findings.   Meds & Orders: No orders of the defined types were placed in this encounter.   Orders Placed This Encounter  Procedures  . Large Joint Injection/Arthrocentesis  . XR C-ARM NO REPORT    Follow-up: Return for Dr. Magnus Ivan 11/04/2016.   Procedures: Diagnostic and therapeutic anesthetic hip arthrogram some relief with her symptoms during the anesthetic phase. She still has difficulty with walking from a mechanical standpoint. Date/Time: 10/29/2016 3:47 PM Performed by: Tyrell Antonio Authorized by: Tyrell Antonio   Consent Given by:  Patient Site marked: the procedure site was  marked   Timeout: prior to procedure the correct patient, procedure, and site was verified   Indications:  Pain and diagnostic evaluation Location:  Hip Site:  R hip joint Prep: patient was prepped and draped in usual sterile fashion   Needle Size:  22 G Approach:  Anterior Ultrasound Guidance: No   Fluoroscopic Guidance: No   Arthrogram: Yes   Medications:  3 mL bupivacaine 0.5 %; 80 mg triamcinolone acetonide 40 MG/ML Aspiration Attempted: Yes   Patient tolerance:  Patient tolerated the procedure well with no immediate complications  Arthrogram demonstrated excellent flow of contrast throughout the joint surface without extravasation or obvious defect.  The patient had some relief with symptoms of a constant pain that she was having into the hip and knee. She still had difficulty ambulating and was still having pain with that. Diagnostic and therapeutic anesthetic hip arthrogram. Patient did seem to have some relief during the anesthetic phase and still having some mechanical walking difficulties.      No notes on file   Clinical History: No specialty comments available.  She reports that she has never smoked. She has never used smokeless tobacco. No results for input(s): HGBA1C, LABURIC in the last 8760 hours.  Objective:  VS:  HT:    WT:   BMI:     BP:   HR: bpm  TEMP: ( )  RESP:  Physical Exam  Musculoskeletal:  Patient ambulates without a cane but with a Trendelenburg gait on the right. She does  have pain with hip rotation. She does have pain over the greater trochanter. She has good distal strength.    Ortho Exam Imaging: Xr C-arm No Report  Result Date: 10/29/2016 Please see Notes or Procedures tab for imaging impression.   Past Medical/Family/Surgical/Social History: Medications & Allergies reviewed per EMR Patient Active Problem List   Diagnosis Date Noted  . S/P lumbar spinal fusion 07/21/2015   Past Medical History:  Diagnosis Date  . Diabetes mellitus  without complication (HCC)   . History of bronchitis   . History of pneumonia   . RA (rheumatoid arthritis) (HCC)   . TIA (transient ischemic attack)   . Wears glasses    Family History  Problem Relation Age of Onset  . Diabetes Mother   . Stroke Mother   . High blood pressure Father   . Stroke Father   . Diabetes Father    Past Surgical History:  Procedure Laterality Date  . APPENDECTOMY    . BACK SURGERY  2012, 2017  . BILATERAL CARPAL TUNNEL RELEASE    . CESAREAN SECTION     x3  . GLAUCOMA SURGERY    . KNEE ARTHROSCOPY Left   . TONSILLECTOMY     Social History   Occupational History  . Retired     Runner, broadcasting/film/video   Social History Main Topics  . Smoking status: Never Smoker  . Smokeless tobacco: Never Used  . Alcohol use No  . Drug use: No  . Sexual activity: Not on file

## 2016-10-30 MED ORDER — TRIAMCINOLONE ACETONIDE 40 MG/ML IJ SUSP
80.0000 mg | INTRAMUSCULAR | Status: AC | PRN
  Administered 2016-10-29: 80 mg via INTRA_ARTICULAR

## 2016-10-30 MED ORDER — BUPIVACAINE HCL 0.5 % IJ SOLN
3.0000 mL | INTRAMUSCULAR | Status: AC | PRN
  Administered 2016-10-29: 3 mL via INTRA_ARTICULAR

## 2016-11-04 ENCOUNTER — Ambulatory Visit (INDEPENDENT_AMBULATORY_CARE_PROVIDER_SITE_OTHER): Payer: Medicare Other | Admitting: Orthopaedic Surgery

## 2016-11-04 DIAGNOSIS — M7061 Trochanteric bursitis, right hip: Secondary | ICD-10-CM | POA: Diagnosis not present

## 2016-11-04 DIAGNOSIS — M25551 Pain in right hip: Secondary | ICD-10-CM

## 2016-11-04 NOTE — Progress Notes (Signed)
The patient is returning after a right hip trochanteric injection that I performed as well as a right hip intra-articular injection that Dr. Alvester Morin performed. She does feel like she is getting better from both injections. Most her pain still seems to be the lateral aspect of the right hip and not the groin itself. This does refer pain down to her right knee as well. She does feel like she is a fall risk. We had a long and thorough discussion about the need for physical therapy specifically for her right hip to consider working her balance and coordination and a cane in her opposite left hand. Also modalities such as a TENS unit and dry needling would be helpful. She is actually having a second opinion as a relates to her lumbar spine at [facility] let her this week.  On examination she seems more comfortable today than she was last time I saw her. She still has tenderness over the trochanteric area and IT band on the right side but I can more easily move her hip through rotation on the right side.  I like see her back in a month after a course of formal physical therapy. At that visit I may consider a second right hip trochanteric injection. All questions and concerns were answered and addressed.

## 2016-12-04 ENCOUNTER — Other Ambulatory Visit (INDEPENDENT_AMBULATORY_CARE_PROVIDER_SITE_OTHER): Payer: Self-pay

## 2016-12-04 ENCOUNTER — Ambulatory Visit (INDEPENDENT_AMBULATORY_CARE_PROVIDER_SITE_OTHER): Payer: Medicare Other | Admitting: Orthopaedic Surgery

## 2016-12-04 DIAGNOSIS — M25551 Pain in right hip: Secondary | ICD-10-CM

## 2016-12-04 NOTE — Progress Notes (Signed)
The patient is following up after intra-articular injection of her right hip.  She said the injection was wonderful for her until about 10 days ago.  The injection was done about a month ago.  She said she went pain-free for a little while but now she is having severe pain again all around plain films show just some mild joint space narrowing but I am encouraged by the fact that the injection did get her to be pain-free for a little while.  On exam she still has significant pain with internal ex rotation of the right hip.  She is walking with a limp and Trendelenburg gait.  At this point I would like to obtain an MRI of her right hip to really assess the cartilage closely so we can make a determination whether or not she would benefit from hip replacement surgery.  She understands this as well.  All questions were encouraged and answered.  I did give her handout on hip replacement surgery as well.  I will see her back in 2 weeks and hopefully we will have an idea of what her cartilage truly looks like with her right hip.

## 2016-12-14 ENCOUNTER — Ambulatory Visit
Admission: RE | Admit: 2016-12-14 | Discharge: 2016-12-14 | Disposition: A | Discharge status: Home / Self Care | Payer: Medicare Other | Source: Ambulatory Visit | Attending: Orthopaedic Surgery | Admitting: Orthopaedic Surgery

## 2016-12-14 DIAGNOSIS — M25551 Pain in right hip: Secondary | ICD-10-CM

## 2016-12-18 ENCOUNTER — Ambulatory Visit (INDEPENDENT_AMBULATORY_CARE_PROVIDER_SITE_OTHER): Payer: Medicare Other | Admitting: Orthopaedic Surgery

## 2016-12-18 DIAGNOSIS — M87051 Idiopathic aseptic necrosis of right femur: Secondary | ICD-10-CM | POA: Insufficient documentation

## 2016-12-18 NOTE — Progress Notes (Signed)
The patient is very well known to [place].  She is a 71 year old female with debilitating right hip pain.  Her x-rays were not conclusive for any type of significant disease in the hip only had an intra-articular injection which was obtained did help her feel better for about a month.  She has severe pain still with weightbearing so was sent her for an MRI of her hip she is here for review of this today.  On exam I can still put her hip through internal and external rotation with just some mild discomfort but more of her discomfort is with weightbearing and she is walking with a significant limp.  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  There is edematous changes in the femoral head down into the neck and trochanteric region.  She does have some tendinitis and bursitis around both hips.  I do not see any significant disease in the left hip.  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  I gave her surgery schedulers card.  We a long and thorough discussion about this type of surgery and she is at a handout about it before.  We had a long and thorough discussion about the risk and benefits of it.  I do feel that at this point it will become medically necessary due to the flattening of the femoral head and the risk of femoral head collapse.  I explained this to her as well.  She would like to talk to her daughters about this and get back to [place].  I gave her our surgery schedulers card.

## 2017-01-07 ENCOUNTER — Ambulatory Visit: Payer: Medicare Other | Admitting: Adult Health

## 2017-01-07 ENCOUNTER — Encounter: Payer: Self-pay | Admitting: Adult Health

## 2017-01-07 VITALS — BP 136/65 | HR 62 | Ht 62.0 in | Wt 196.0 lb

## 2017-01-07 DIAGNOSIS — G43109 Migraine with aura, not intractable, without status migrainosus: Secondary | ICD-10-CM | POA: Diagnosis not present

## 2017-01-07 DIAGNOSIS — G43809 Other migraine, not intractable, without status migrainosus: Secondary | ICD-10-CM

## 2017-01-07 NOTE — Progress Notes (Signed)
PATIENT: [name] DOB: [birth year]  REASON FOR VISIT: follow up-vestibular migraines HISTORY FROM: patient  HISTORY OF PRESENT ILLNESS: Today 01/07/17 [name] is a 71 year old female with a history of vestibular migraines.  She returns today for follow-up.  She continues on Topamax extended release 50 mg daily.  She reports that since last visit she has not had any vestibular migraines.  She states on occasion she feels as if she may get a headache but it never comes.  She states since last visit she has had to take Tylenol maybe once or twice for headache.  She reports overall she is doing fairly well.  She is planning to have hip surgery this month.  She returns today for an evaluation.   HISTORY Today 07/08/2016 this [name] is a 71 year old female with a history of vestibular migraines. She returns today for an evaluation. She is currently on Topamax extended release she reports this has been beneficial for her headaches. She states that her headache severity and frequency has improved. She states that since her appointment in March she has had approximately 3-4 headaches that has required her to take over-the-counter medication. She has not tried Imitrex yet. She states that she has not had a recurrent headache like she had prior to Topamax. She does note that at times she has had an "out of body experience" and feels as if she may have an episode but the headache never comes. She also states that when she was shoe shopping and as she was looking up she begin to get dizzy but then quickly stopped doing this and  It improved. She denies any new neurological symptoms. Returns today for an evaluation.   HISTORY 04/26/16 ( Copied from Dr. Trevor Mace notes): Amanda Mahler McNultyis a 71 y.o.femalehere as a referral from Dr. Konrad Dolores dizziness. Medical history migraines(since hypertension, hypercholesterolemia, spinal stenosis, diabetes, osteoarthritis and polyarthropathy,  trochanteric bursitis, vitamin D deficiency, glaucoma, rheumatoid arthritis, spondylolisthesis.October 17 she woke up with severe spinning, nausea. Then it happened again January 29th worsening, she can incapacitated with a headache for 3 days. She has headaches at the temples with light sensitivity, nausea, pounding/throbbing, feels like her eyes are going to pop out. She has daily headache. She is taking 6-8 extra strength excedrin every day. Worse in the morning and at night. Headaches wake her up. She has gained 14 pounds since July. Blurry vision and vertigo. Severe, continuous, daily headaches. She has neck pain.No other focal neurologic deficits, associated symptoms, inciting events or modifiable factors.  Reviewed notes, labs and imaging from outside physicians, which showed:  CT headshowed No acute intracranial abnormalities including mass lesion or mass effect, hydrocephalus, extra-axial fluid collection, midline shift, hemorrhage, or acute infarction, large ischemic events (personally reviewed images)    REVIEW OF SYSTEMS: Out of a complete 14 system review of symptoms, the patient complains only of the following symptoms, and all other reviewed systems are negative.  Joint pain, joint swelling, back pain, walking difficulty  ALLERGIES: Allergies  Allergen Reactions  . Penicillins Itching and Swelling    Has patient had a PCN reaction causing immediate rash, facial/tongue/throat swelling, SOB or lightheadedness with hypotension: Yes Has patient had a PCN reaction causing severe rash involving mucus membranes or skin necrosis: No Has patient had a PCN reaction that required hospitalization No Has patient had a PCN reaction occurring within the last 10 years: No If all of the above answers are "NO", then may proceed with Cephalosporin use.  HOME MEDICATIONS: Outpatient Medications Prior to Visit  Medication Sig Dispense Refill  . cetirizine (ZYRTEC) 10 MG tablet Take 10 mg  by mouth daily. For 3 days prior to Remicade infusion    . diclofenac (VOLTAREN) 75 MG EC tablet TAKE 1 TABLET BY MOUTH ONCE A DAY    . ezetimibe (ZETIA) 10 MG tablet Take 10 mg by mouth daily.    . fenofibrate 54 MG tablet Take 54 mg by mouth daily.    . folic acid (FOLVITE) 800 MCG tablet Take 1,600 mcg by mouth daily.    . furosemide (LASIX) 20 MG tablet Take 30 mg by mouth daily.    . Golimumab (SIMPONI ARIA IV) Inject into the vein every 6 (six) weeks.    Marland Kitchen HYDROcodone-acetaminophen (NORCO/VICODIN) 5-325 MG tablet Take 1 tablet by mouth every 6 (six) hours as needed for moderate pain.    . methotrexate 50 MG/2ML injection Inject 20 mg into the vein every Tuesday.     . pioglitazone (ACTOS) 30 MG tablet Take 30 mg by mouth daily.    . SUMAtriptan (IMITREX) 100 MG tablet Take 1 tablet (100 mg total) by mouth once as needed. May repeat in 2 hours if headache persists or recurs. 10 tablet 12  . Topiramate ER 100 MG CS24 Take 100 mg by mouth at bedtime. (Patient not taking: Reported on 07/08/2016) 30 each 12  . Topiramate ER 50 MG CS24 Take 50 mg by mouth at bedtime. May increase to 100 mg by mouth at bedtime as tolerated. 60 each 11   No facility-administered medications prior to visit.     PAST MEDICAL HISTORY: Past Medical History:  Diagnosis Date  . Diabetes mellitus without complication (HCC)   . History of bronchitis   . History of pneumonia   . RA (rheumatoid arthritis) (HCC)   . TIA (transient ischemic attack)   . Wears glasses     PAST SURGICAL HISTORY: Past Surgical History:  Procedure Laterality Date  . APPENDECTOMY    . BACK SURGERY  2012, 2017  . BILATERAL CARPAL TUNNEL RELEASE    . CESAREAN SECTION     x3  . GLAUCOMA SURGERY    . KNEE ARTHROSCOPY Left   . TONSILLECTOMY      FAMILY HISTORY: Family History  Problem Relation Age of Onset  . Diabetes Mother   . Stroke Mother   . High blood pressure Father   . Stroke Father   . Diabetes Father     SOCIAL  HISTORY: Social History   Socioeconomic History  . Marital status: Widowed    Spouse name: Not on file  . Number of children: 4  . Years of education: 35  . Highest education level: Not on file  Social Needs  . Financial resource strain: Not on file  . Food insecurity - worry: Not on file  . Food insecurity - inability: Not on file  . Transportation needs - medical: Not on file  . Transportation needs - non-medical: Not on file  Occupational History  . Occupation: Retired    Comment: Runner, broadcasting/film/video  Tobacco Use  . Smoking status: Never Smoker  . Smokeless tobacco: Never Used  Substance and Sexual Activity  . Alcohol use: No  . Drug use: No  . Sexual activity: Not on file  Other Topics Concern  . Not on file  Social History Narrative   Lives at home alone   Right-handed   Caffeine: sodas      PHYSICAL EXAM  Vitals:   01/07/17 0809  BP: 136/65  Pulse: 62  Weight: 196 lb (88.9 kg)  Height: 5\' 2"  (1.575 m)   Body mass index is 35.85 kg/m.  Generalized: Well developed, in no acute distress   Neurological examination  Mentation: Alert oriented to time, place, history taking. Follows all commands speech and language fluent Cranial nerve II-XII: Pupils were equal round reactive to light. Extraocular movements were full, visual field were full on confrontational test. Facial sensation and strength were normal. Uvula tongue midline. Head turning and shoulder shrug  were normal and symmetric. Motor: The motor testing reveals 5 over 5 strength of all 4 extremities. Good symmetric motor tone is noted throughout.  Sensory: Sensory testing is intact to soft touch on all 4 extremities. No evidence of extinction is noted.  Coordination: Cerebellar testing reveals good finger-nose-finger and heel-to-shin bilaterally.  Gait and station: Patient walks with a slight limp due to hip.  Tandem gait not attempted. Reflexes: Deep tendon reflexes are symmetric and normal bilaterally.    DIAGNOSTIC DATA (LABS, IMAGING, TESTING) - I reviewed patient records, labs, notes, testing and imaging myself where available.  Lab Results  Component Value Date   WBC 11.6 (H) 11/23/2015   HGB 10.4 (L) 11/23/2015   HCT 32.4 (L) 11/23/2015   MCV 98.8 11/23/2015   PLT 303 11/23/2015      Component Value Date/Time   NA 139 11/23/2015 0102   K 4.0 11/23/2015 0102   CL 106 11/23/2015 0102   CO2 26 11/23/2015 0102   GLUCOSE 164 (H) 11/23/2015 0102   BUN 30 (H) 11/23/2015 0102   CREATININE 0.99 11/23/2015 0102   CALCIUM 10.3 11/23/2015 0102   PROT 7.0 02/25/2007 0520   ALBUMIN 3.6 02/25/2007 0520   AST 17 02/25/2007 0520   ALT 17 02/25/2007 0520   ALKPHOS 68 02/25/2007 0520   BILITOT 0.7 02/25/2007 0520   GFRNONAA 57 (L) 11/23/2015 0102   GFRAA >60 11/23/2015 0102    Lab Results  Component Value Date   HGBA1C 6.1 (H) 07/13/2015     ASSESSMENT AND PLAN 71 y.o. year old female  has a past medical history of Diabetes mellitus without complication (HCC), History of bronchitis, History of pneumonia, RA (rheumatoid arthritis) (HCC), TIA (transient ischemic attack), and Wears glasses. here with:  1.  Vestibular migraines  Overall the patient is doing well.  She will continue on Topamax extended release 50 mg daily.  I have advised that if her symptoms worsen or she begins to have headaches again she should let us know.  She will follow-up in 6 months or sooner if needed.  I spent 15 minutes with the patient. 50% of this time was spent discussing medication.    Butch Penny, MSN, NP-C 01/07/2017, 7:54 AM [facility] [address] [place], [place] [address] [phone]

## 2017-01-07 NOTE — Patient Instructions (Signed)
Your Plan:  Continue Topiramate ER 50 mg daily  If your symptoms worsen or you develop new symptoms please let us know.   Thank you for coming to see [place] at [facility]. I hope we have been able to provide you high quality care today.  You may receive a patient satisfaction survey over the next few weeks. We would appreciate your feedback and comments so that we may continue to improve ourselves and the health of our patients.

## 2017-01-09 NOTE — Progress Notes (Signed)
Personally  participated in, made any corrections needed, and agree with history, physical, neuro exam,assessment and plan as stated above.    Antonia Ahern, MD [facility] 

## 2017-01-17 NOTE — Progress Notes (Signed)
Requested orders from sherrie at dr c blackman office for 12-27 office

## 2017-01-22 ENCOUNTER — Encounter (HOSPITAL_COMMUNITY): Payer: Self-pay

## 2017-01-22 ENCOUNTER — Other Ambulatory Visit (INDEPENDENT_AMBULATORY_CARE_PROVIDER_SITE_OTHER): Payer: Self-pay | Admitting: Orthopaedic Surgery

## 2017-01-22 DIAGNOSIS — M87051 Idiopathic aseptic necrosis of right femur: Secondary | ICD-10-CM

## 2017-01-22 NOTE — Patient Instructions (Signed)
Marki Frede Ricciardi  01/22/2017   Your procedure is scheduled on: Thursday, Dec. 27, 2018   Report to [facility] Main  Entrance    Report to admitting at 9:00 AM   Call this number if you have problems the morning of surgery  [phone]   Remember: ONLY 1 PERSON MAY GO WITH YOU TO SHORT STAY TO GET  READY MORNING OF YOUR SURGERY.   Do not eat food or drink liquids :After Midnight.    Take these medicines the morning of surgery with A SIP OF WATER: Cetirizine, Ezetimbe, Fenofibrate, Hydroxyzine   DO NOT TAKE ANY DIABETIC MEDICATIONS DAY OF YOUR SURGERY                               You may not have any metal on your body including hair pins, jewelry, and body  piercings              Do not wear make-up, lotions, powders, perfumes, or deodorant             Do not wear nail polish.  Do not shave  48 hours prior to surgery.                Do not bring valuables to the hospital. [facility] IS NOT             RESPONSIBLE   FOR VALUABLES.   Contacts, dentures or bridgework may not be worn into surgery.   Leave suitcase in the car. After surgery it may be brought to your room.              Please read over the following fact sheets you were given: _____________________________________________________________________       [facility] - Preparing for Surgery Before surgery, you can play an important role.  Because skin is not sterile, your skin needs to be as free of germs as possible.  You can reduce the number of germs on your skin by washing with CHG (chlorahexidine gluconate) soap before surgery.  CHG is an antiseptic cleaner which kills germs and bonds with the skin to continue killing germs even after washing. Please DO NOT use if you have an allergy to CHG or antibacterial soaps.  If your skin becomes reddened/irritated stop using the CHG and inform your nurse when you arrive at Short Stay. Do not shave (including legs and underarms) for at least 48  hours prior to the first CHG shower.  You may shave your face/neck.  Please follow these instructions carefully:  1.  Shower with CHG Soap the night before surgery and the  morning of surgery.  2.  If you choose to wash your hair, wash your hair first as usual with your normal  shampoo.  3.  After you shampoo, rinse your hair and body thoroughly to remove the shampoo.                             4.  Use CHG as you would any other liquid soap.  You can apply chg directly to the skin and wash.  Gently with a scrungie or clean washcloth.  5.  Apply the CHG Soap to your body ONLY FROM THE NECK DOWN.   Do   not use on face/ open  Wound or open sores. Avoid contact with eyes, ears mouth and   genitals (private parts).                       Wash face,  Genitals (private parts) with your normal soap.             6.  Wash thoroughly, paying special attention to the area where your    surgery  will be performed.  7.  Thoroughly rinse your body with warm water from the neck down.  8.  DO NOT shower/wash with your normal soap after using and rinsing off the CHG Soap.                9.  Pat yourself dry with a clean towel.            10.  Wear clean pajamas.            11.  Place clean sheets on your bed the night of your first shower and do not  sleep with pets. Day of Surgery : Do not apply any lotions/deodorants the morning of surgery.  Please wear clean clothes to the hospital/surgery center.  FAILURE TO FOLLOW THESE INSTRUCTIONS MAY RESULT IN THE CANCELLATION OF YOUR SURGERY  PATIENT SIGNATURE_________________________________  NURSE SIGNATURE__________________________________  ________________________________________________________________________   Adam Phenix  An incentive spirometer is a tool that can help keep your lungs clear and active. This tool measures how well you are filling your lungs with each breath. Taking long deep breaths may help reverse or  decrease the chance of developing breathing (pulmonary) problems (especially infection) following:  A long period of time when you are unable to move or be active. BEFORE THE PROCEDURE   If the spirometer includes an indicator to show your best effort, your nurse or respiratory therapist will set it to a desired goal.  If possible, sit up straight or lean slightly forward. Try not to slouch.  Hold the incentive spirometer in an upright position. INSTRUCTIONS FOR USE  1. Sit on the edge of your bed if possible, or sit up as far as you can in bed or on a chair. 2. Hold the incentive spirometer in an upright position. 3. Breathe out normally. 4. Place the mouthpiece in your mouth and seal your lips tightly around it. 5. Breathe in slowly and as deeply as possible, raising the piston or the ball toward the top of the column. 6. Hold your breath for 3-5 seconds or for as long as possible. Allow the piston or ball to fall to the bottom of the column. 7. Remove the mouthpiece from your mouth and breathe out normally. 8. Rest for a few seconds and repeat Steps 1 through 7 at least 10 times every 1-2 hours when you are awake. Take your time and take a few normal breaths between deep breaths. 9. The spirometer may include an indicator to show your best effort. Use the indicator as a goal to work toward during each repetition. 10. After each set of 10 deep breaths, practice coughing to be sure your lungs are clear. If you have an incision (the cut made at the time of surgery), support your incision when coughing by placing a pillow or rolled up towels firmly against it. Once you are able to get out of bed, walk around indoors and cough well. You may stop using the incentive spirometer when instructed by your caregiver.  RISKS AND COMPLICATIONS  Take your time  so you do not get dizzy or light-headed.  If you are in pain, you may need to take or ask for pain medication before doing incentive spirometry.  It is harder to take a deep breath if you are having pain. AFTER USE  Rest and breathe slowly and easily.  It can be helpful to keep track of a log of your progress. Your caregiver can provide you with a simple table to help with this. If you are using the spirometer at home, follow these instructions: [place] IF:   You are having difficultly using the spirometer.  You have trouble using the spirometer as often as instructed.  Your pain medication is not giving enough relief while using the spirometer.  You develop fever of 100.5 F (38.1 C) or higher. SEEK IMMEDIATE MEDICAL CARE IF:   You cough up bloody sputum that had not been present before.  You develop fever of 102 F (38.9 C) or greater.  You develop worsening pain at or near the incision site. MAKE SURE YOU:   Understand these instructions.  Will watch your condition.  Will get help right away if you are not doing well or get worse. Document Released: 06/03/2006 Document Revised: 04/15/2011 Document Reviewed: 08/04/2006 ExitCare Patient Information 2014 ExitCare, [place].   ________________________________________________________________________  WHAT IS A BLOOD TRANSFUSION? Blood Transfusion Information  A transfusion is the replacement of blood or some of its parts. Blood is made up of multiple cells which provide different functions.  Red blood cells carry oxygen and are used for blood loss replacement.  White blood cells fight against infection.  Platelets control bleeding.  Plasma helps clot blood.  Other blood products are available for specialized needs, such as hemophilia or other clotting disorders. BEFORE THE TRANSFUSION  Who gives blood for transfusions?   Healthy volunteers who are fully evaluated to make sure their blood is safe. This is blood bank blood. Transfusion therapy is the safest it has ever been in the practice of medicine. Before blood is taken from a donor, a complete  history is taken to make sure that person has no history of diseases nor engages in risky social behavior (examples are intravenous drug use or sexual activity with multiple partners). The donor's travel history is screened to minimize risk of transmitting infections, such as malaria. The donated blood is tested for signs of infectious diseases, such as HIV and hepatitis. The blood is then tested to be sure it is compatible with you in order to minimize the chance of a transfusion reaction. If you or a relative donates blood, this is often done in anticipation of surgery and is not appropriate for emergency situations. It takes many days to process the donated blood. RISKS AND COMPLICATIONS Although transfusion therapy is very safe and saves many lives, the main dangers of transfusion include:   Getting an infectious disease.  Developing a transfusion reaction. This is an allergic reaction to something in the blood you were given. Every precaution is taken to prevent this. The decision to have a blood transfusion has been considered carefully by your caregiver before blood is given. Blood is not given unless the benefits outweigh the risks. AFTER THE TRANSFUSION  Right after receiving a blood transfusion, you will usually feel much better and more energetic. This is especially true if your red blood cells have gotten low (anemic). The transfusion raises the level of the red blood cells which carry oxygen, and this usually causes an energy increase.  The  nurse administering the transfusion will monitor you carefully for complications. HOME CARE INSTRUCTIONS  No special instructions are needed after a transfusion. You may find your energy is better. Speak with your caregiver about any limitations on activity for underlying diseases you may have. SEEK MEDICAL CARE IF:   Your condition is not improving after your transfusion.  You develop redness or irritation at the intravenous (IV) site. SEEK  IMMEDIATE MEDICAL CARE IF:  Any of the following symptoms occur over the next 12 hours:  Shaking chills.  You have a temperature by mouth above 102 F (38.9 C), not controlled by medicine.  Chest, back, or muscle pain.  People around you feel you are not acting correctly or are confused.  Shortness of breath or difficulty breathing.  Dizziness and fainting.  You get a rash or develop hives.  You have a decrease in urine output.  Your urine turns a dark color or changes to pink, red, or brown. Any of the following symptoms occur over the next 10 days:  You have a temperature by mouth above 102 F (38.9 C), not controlled by medicine.  Shortness of breath.  Weakness after normal activity.  The white part of the eye turns yellow (jaundice).  You have a decrease in the amount of urine or are urinating less often.  Your urine turns a dark color or changes to pink, red, or brown. Document Released: 01/19/2000 Document Revised: 04/15/2011 Document Reviewed: 09/07/2007 [employer] Patient Information 2014 [place], [place].  _______________________________________________________________________

## 2017-01-22 NOTE — Pre-Procedure Instructions (Signed)
Last office note with [place] NP 01/07/17 in epic.

## 2017-01-24 ENCOUNTER — Encounter (INDEPENDENT_AMBULATORY_CARE_PROVIDER_SITE_OTHER): Payer: Self-pay

## 2017-01-24 ENCOUNTER — Other Ambulatory Visit: Payer: Self-pay

## 2017-01-24 ENCOUNTER — Encounter (HOSPITAL_COMMUNITY)
Admission: RE | Admit: 2017-01-24 | Discharge: 2017-01-24 | Disposition: A | Discharge status: Home / Self Care | Payer: Medicare Other | Source: Ambulatory Visit | Attending: Orthopaedic Surgery | Admitting: Orthopaedic Surgery

## 2017-01-24 ENCOUNTER — Encounter (HOSPITAL_COMMUNITY): Payer: Self-pay

## 2017-01-24 DIAGNOSIS — Z01812 Encounter for preprocedural laboratory examination: Secondary | ICD-10-CM | POA: Diagnosis present

## 2017-01-24 DIAGNOSIS — R9431 Abnormal electrocardiogram [ECG] [EKG]: Secondary | ICD-10-CM | POA: Diagnosis not present

## 2017-01-24 DIAGNOSIS — Z01810 Encounter for preprocedural cardiovascular examination: Secondary | ICD-10-CM | POA: Insufficient documentation

## 2017-01-24 DIAGNOSIS — M8788 Other osteonecrosis, other site: Secondary | ICD-10-CM | POA: Diagnosis not present

## 2017-01-24 HISTORY — DX: Headache, unspecified: R51.9

## 2017-01-24 HISTORY — DX: Depression, unspecified: F32.A

## 2017-01-24 HISTORY — DX: Unspecified glaucoma: H40.9

## 2017-01-24 HISTORY — DX: Major depressive disorder, single episode, unspecified: F32.9

## 2017-01-24 HISTORY — DX: Headache: R51

## 2017-01-24 HISTORY — DX: Dizziness and giddiness: R42

## 2017-01-24 LAB — SURGICAL PCR SCREEN
MRSA, PCR: NEGATIVE
Staphylococcus aureus: NEGATIVE

## 2017-01-24 LAB — CBC
HCT: 39.5 % (ref 36.0–46.0)
Hemoglobin: 12.8 g/dL (ref 12.0–15.0)
MCH: 32.8 pg (ref 26.0–34.0)
MCHC: 32.4 g/dL (ref 30.0–36.0)
MCV: 101.3 fL — AB (ref 78.0–100.0)
PLATELETS: 317 10*3/uL (ref 150–400)
RBC: 3.9 MIL/uL (ref 3.87–5.11)
RDW: 14.3 % (ref 11.5–15.5)
WBC: 8.2 10*3/uL (ref 4.0–10.5)

## 2017-01-24 LAB — BASIC METABOLIC PANEL
Anion gap: 7 (ref 5–15)
BUN: 24 mg/dL — AB (ref 6–20)
CALCIUM: 10.1 mg/dL (ref 8.9–10.3)
CO2: 25 mmol/L (ref 22–32)
CREATININE: 0.75 mg/dL (ref 0.44–1.00)
Chloride: 108 mmol/L (ref 101–111)
GFR calc non Af Amer: >60 mL/min (ref >60)
Glucose, Bld: 113 mg/dL — ABNORMAL HIGH (ref 65–99)
Potassium: 4.5 mmol/L (ref 3.5–5.1)
SODIUM: 140 mmol/L (ref 135–145)

## 2017-01-24 LAB — HEMOGLOBIN A1C
Hgb A1c MFr Bld: 6.1 % — ABNORMAL HIGH (ref 4.8–5.6)
MEAN PLASMA GLUCOSE: 128.37 mg/dL

## 2017-01-24 LAB — GLUCOSE, CAPILLARY: GLUCOSE-CAPILLARY: 97 mg/dL (ref 65–99)

## 2017-01-24 LAB — ABO/RH: ABO/RH(D): O NEG

## 2017-01-24 NOTE — Pre-Procedure Instructions (Signed)
CBC, BMP, and Hgb A1C results 01/24/17 faxed to Dr. Magnus Ivan via epic.

## 2017-01-24 NOTE — Pre-Procedure Instructions (Signed)
Dr. Demetrius Charity reviewed EKG no new orders received at this time.

## 2017-01-30 ENCOUNTER — Other Ambulatory Visit: Payer: Self-pay

## 2017-01-30 ENCOUNTER — Encounter (HOSPITAL_COMMUNITY): Payer: Self-pay

## 2017-01-30 ENCOUNTER — Inpatient Hospital Stay (HOSPITAL_COMMUNITY): Payer: Medicare Other

## 2017-01-30 ENCOUNTER — Inpatient Hospital Stay (HOSPITAL_COMMUNITY): Payer: Medicare Other | Admitting: Anesthesiology

## 2017-01-30 ENCOUNTER — Inpatient Hospital Stay (HOSPITAL_COMMUNITY)
Admission: RE | Admit: 2017-01-30 | Discharge: 2017-02-03 | DRG: 470 | Disposition: A | Discharge status: Skilled Nursing Facility | Payer: Medicare Other | Source: Ambulatory Visit | Attending: Orthopaedic Surgery | Admitting: Orthopaedic Surgery

## 2017-01-30 ENCOUNTER — Encounter (HOSPITAL_COMMUNITY)
Admission: RE | Disposition: A | Discharge status: Skilled Nursing Facility | Payer: Self-pay | Source: Ambulatory Visit | Attending: Orthopaedic Surgery

## 2017-01-30 DIAGNOSIS — M1611 Unilateral primary osteoarthritis, right hip: Principal | ICD-10-CM | POA: Diagnosis present

## 2017-01-30 DIAGNOSIS — Z8673 Personal history of transient ischemic attack (TIA), and cerebral infarction without residual deficits: Secondary | ICD-10-CM | POA: Diagnosis not present

## 2017-01-30 DIAGNOSIS — M069 Rheumatoid arthritis, unspecified: Secondary | ICD-10-CM | POA: Diagnosis present

## 2017-01-30 DIAGNOSIS — D62 Acute posthemorrhagic anemia: Secondary | ICD-10-CM | POA: Diagnosis not present

## 2017-01-30 DIAGNOSIS — G43809 Other migraine, not intractable, without status migrainosus: Secondary | ICD-10-CM | POA: Diagnosis present

## 2017-01-30 DIAGNOSIS — Z880 Allergy status to penicillin: Secondary | ICD-10-CM | POA: Diagnosis not present

## 2017-01-30 DIAGNOSIS — Z79899 Other long term (current) drug therapy: Secondary | ICD-10-CM | POA: Diagnosis not present

## 2017-01-30 DIAGNOSIS — Z981 Arthrodesis status: Secondary | ICD-10-CM | POA: Diagnosis not present

## 2017-01-30 DIAGNOSIS — M87051 Idiopathic aseptic necrosis of right femur: Secondary | ICD-10-CM

## 2017-01-30 DIAGNOSIS — H409 Unspecified glaucoma: Secondary | ICD-10-CM | POA: Diagnosis present

## 2017-01-30 DIAGNOSIS — E119 Type 2 diabetes mellitus without complications: Secondary | ICD-10-CM | POA: Diagnosis present

## 2017-01-30 DIAGNOSIS — Z791 Long term (current) use of non-steroidal anti-inflammatories (NSAID): Secondary | ICD-10-CM

## 2017-01-30 DIAGNOSIS — M879 Osteonecrosis, unspecified: Secondary | ICD-10-CM | POA: Diagnosis present

## 2017-01-30 DIAGNOSIS — Z419 Encounter for procedure for purposes other than remedying health state, unspecified: Secondary | ICD-10-CM

## 2017-01-30 DIAGNOSIS — Z96641 Presence of right artificial hip joint: Secondary | ICD-10-CM

## 2017-01-30 HISTORY — PX: TOTAL HIP ARTHROPLASTY: SHX124

## 2017-01-30 LAB — TYPE AND SCREEN
ABO/RH(D): O NEG
Antibody Screen: NEGATIVE

## 2017-01-30 LAB — GLUCOSE, CAPILLARY
GLUCOSE-CAPILLARY: 139 mg/dL — AB (ref 65–99)
Glucose-Capillary: 99 mg/dL (ref 65–99)

## 2017-01-30 SURGERY — ARTHROPLASTY, HIP, TOTAL, ANTERIOR APPROACH
Anesthesia: General | Laterality: Right

## 2017-01-30 MED ORDER — PROPOFOL 10 MG/ML IV BOLUS
INTRAVENOUS | Status: AC
  Filled 2017-01-30: qty 40

## 2017-01-30 MED ORDER — CLINDAMYCIN PHOSPHATE 900 MG/50ML IV SOLN
900.0000 mg | INTRAVENOUS | Status: AC
Start: 2017-01-30 — End: 2017-01-30
  Administered 2017-01-30: 900 mg via INTRAVENOUS

## 2017-01-30 MED ORDER — POLYETHYLENE GLYCOL 3350 17 G PO PACK: 17.0000 g | PACK | Freq: Every day | ORAL | Status: DC | PRN

## 2017-01-30 MED ORDER — LIDOCAINE 2% (20 MG/ML) 5 ML SYRINGE
INTRAMUSCULAR | Status: AC
  Filled 2017-01-30: qty 5

## 2017-01-30 MED ORDER — DIPHENHYDRAMINE HCL 12.5 MG/5ML PO ELIX: 12.5000 mg | ORAL_SOLUTION | ORAL | Status: DC | PRN

## 2017-01-30 MED ORDER — METOCLOPRAMIDE HCL 5 MG/ML IJ SOLN: 5.0000 mg | Freq: Three times a day (TID) | INTRAMUSCULAR | Status: DC | PRN

## 2017-01-30 MED ORDER — ROCURONIUM BROMIDE 10 MG/ML (PF) SYRINGE
PREFILLED_SYRINGE | INTRAVENOUS | Status: DC | PRN
Start: 2017-01-30 — End: 2017-01-30
  Administered 2017-01-30: 50 mg via INTRAVENOUS

## 2017-01-30 MED ORDER — MIDAZOLAM HCL 2 MG/2ML IJ SOLN
INTRAMUSCULAR | Status: AC
  Filled 2017-01-30: qty 2

## 2017-01-30 MED ORDER — PHENOL 1.4 % MT LIQD: 1.0000 | OROMUCOSAL | Status: DC | PRN

## 2017-01-30 MED ORDER — DEXAMETHASONE SODIUM PHOSPHATE 10 MG/ML IJ SOLN
INTRAMUSCULAR | Status: AC
  Filled 2017-01-30: qty 1

## 2017-01-30 MED ORDER — ROCURONIUM BROMIDE 50 MG/5ML IV SOSY
PREFILLED_SYRINGE | INTRAVENOUS | Status: AC
  Filled 2017-01-30: qty 5

## 2017-01-30 MED ORDER — FENTANYL CITRATE (PF) 100 MCG/2ML IJ SOLN
INTRAMUSCULAR | Status: AC
  Filled 2017-01-30: qty 2

## 2017-01-30 MED ORDER — CLINDAMYCIN PHOSPHATE 900 MG/50ML IV SOLN
INTRAVENOUS | Status: AC
  Filled 2017-01-30: qty 50

## 2017-01-30 MED ORDER — PROPOFOL 10 MG/ML IV BOLUS
INTRAVENOUS | Status: DC | PRN
  Administered 2017-01-30: 160 mg via INTRAVENOUS

## 2017-01-30 MED ORDER — ASPIRIN 81 MG PO CHEW
81.0000 mg | CHEWABLE_TABLET | Freq: Two times a day (BID) | ORAL | Status: DC
  Administered 2017-01-30 – 2017-02-03 (×8): 81 mg via ORAL
  Filled 2017-01-30 (×8): qty 1

## 2017-01-30 MED ORDER — DOCUSATE SODIUM 100 MG PO CAPS
100.0000 mg | ORAL_CAPSULE | Freq: Two times a day (BID) | ORAL | Status: DC
  Administered 2017-01-30 – 2017-02-03 (×8): 100 mg via ORAL
  Filled 2017-01-30 (×8): qty 1

## 2017-01-30 MED ORDER — SUGAMMADEX SODIUM 200 MG/2ML IV SOLN
INTRAVENOUS | Status: DC | PRN
  Administered 2017-01-30: 200 mg via INTRAVENOUS

## 2017-01-30 MED ORDER — FENOFIBRATE 54 MG PO TABS
54.0000 mg | ORAL_TABLET | Freq: Every day | ORAL | Status: DC
Start: 2017-01-31 — End: 2017-02-03
  Administered 2017-01-31 – 2017-02-03 (×4): 54 mg via ORAL
  Filled 2017-01-30 (×4): qty 1

## 2017-01-30 MED ORDER — MIDAZOLAM HCL 2 MG/2ML IJ SOLN
INTRAMUSCULAR | Status: DC | PRN
  Administered 2017-01-30: 2 mg via INTRAVENOUS

## 2017-01-30 MED ORDER — CLINDAMYCIN PHOSPHATE 600 MG/50ML IV SOLN
600.0000 mg | Freq: Four times a day (QID) | INTRAVENOUS | Status: AC
  Administered 2017-01-30 – 2017-01-31 (×2): 600 mg via INTRAVENOUS
  Filled 2017-01-30 (×2): qty 50

## 2017-01-30 MED ORDER — FENTANYL CITRATE (PF) 100 MCG/2ML IJ SOLN
INTRAMUSCULAR | Status: AC
Start: 2017-01-30 — End: 2017-01-30
  Administered 2017-01-30: 50 ug via INTRAVENOUS
  Filled 2017-01-30: qty 2

## 2017-01-30 MED ORDER — ACETAMINOPHEN 650 MG RE SUPP: 650.0000 mg | RECTAL | Status: DC | PRN

## 2017-01-30 MED ORDER — METOCLOPRAMIDE HCL 5 MG PO TABS: 5.0000 mg | ORAL_TABLET | Freq: Three times a day (TID) | ORAL | Status: DC | PRN

## 2017-01-30 MED ORDER — METHOCARBAMOL 1000 MG/10ML IJ SOLN
500.0000 mg | Freq: Four times a day (QID) | INTRAVENOUS | Status: DC | PRN
  Administered 2017-01-30: 500 mg via INTRAVENOUS
  Filled 2017-01-30: qty 550

## 2017-01-30 MED ORDER — FENTANYL CITRATE (PF) 100 MCG/2ML IJ SOLN
25.0000 ug | INTRAMUSCULAR | Status: DC | PRN
  Administered 2017-01-30 (×3): 50 ug via INTRAVENOUS

## 2017-01-30 MED ORDER — FENTANYL CITRATE (PF) 100 MCG/2ML IJ SOLN
INTRAMUSCULAR | Status: AC
  Administered 2017-01-30: 50 ug via INTRAVENOUS
  Filled 2017-01-30: qty 2

## 2017-01-30 MED ORDER — MENTHOL 3 MG MT LOZG: 1.0000 | LOZENGE | OROMUCOSAL | Status: DC | PRN

## 2017-01-30 MED ORDER — SUGAMMADEX SODIUM 200 MG/2ML IV SOLN
INTRAVENOUS | Status: AC
  Filled 2017-01-30: qty 2

## 2017-01-30 MED ORDER — METOCLOPRAMIDE HCL 5 MG/ML IJ SOLN: 10.0000 mg | Freq: Once | INTRAMUSCULAR | Status: DC | PRN

## 2017-01-30 MED ORDER — MEPERIDINE HCL 50 MG/ML IJ SOLN
6.2500 mg | INTRAMUSCULAR | Status: DC | PRN
Start: 2017-01-30 — End: 2017-01-30

## 2017-01-30 MED ORDER — PIOGLITAZONE HCL 30 MG PO TABS
30.0000 mg | ORAL_TABLET | Freq: Every day | ORAL | Status: DC
  Administered 2017-01-30 – 2017-02-03 (×5): 30 mg via ORAL
  Filled 2017-01-30 (×5): qty 1

## 2017-01-30 MED ORDER — HYDROCODONE-ACETAMINOPHEN 5-325 MG PO TABS
1.0000 | ORAL_TABLET | ORAL | Status: DC | PRN
  Administered 2017-01-30: 2 via ORAL
  Administered 2017-01-30 (×2): 1 via ORAL
  Administered 2017-01-31 – 2017-02-02 (×6): 2 via ORAL
  Filled 2017-01-30 (×3): qty 2
  Filled 2017-01-30: qty 1
  Filled 2017-01-30 (×4): qty 2
  Filled 2017-01-30: qty 1

## 2017-01-30 MED ORDER — HYDROXYZINE HCL 25 MG PO TABS
25.0000 mg | ORAL_TABLET | Freq: Every day | ORAL | Status: DC
  Administered 2017-01-31 – 2017-02-03 (×4): 25 mg via ORAL
  Filled 2017-01-30 (×4): qty 1

## 2017-01-30 MED ORDER — HYDROMORPHONE HCL 1 MG/ML IJ SOLN
1.0000 mg | INTRAMUSCULAR | Status: DC | PRN
  Administered 2017-01-31 (×2): 1 mg via INTRAVENOUS
  Filled 2017-01-30 (×2): qty 1

## 2017-01-30 MED ORDER — ONDANSETRON HCL 4 MG/2ML IJ SOLN
INTRAMUSCULAR | Status: DC | PRN
  Administered 2017-01-30: 4 mg via INTRAVENOUS

## 2017-01-30 MED ORDER — SODIUM CHLORIDE 0.9 % IR SOLN
Status: DC | PRN
  Administered 2017-01-30 (×2): 1000 mL

## 2017-01-30 MED ORDER — HYDROMORPHONE HCL 1 MG/ML IJ SOLN
INTRAMUSCULAR | Status: DC | PRN
  Administered 2017-01-30 (×3): .4 mg via INTRAVENOUS
  Administered 2017-01-30: 0.5 mg via INTRAVENOUS

## 2017-01-30 MED ORDER — ALUM & MAG HYDROXIDE-SIMETH 200-200-20 MG/5ML PO SUSP: 30.0000 mL | ORAL | Status: DC | PRN

## 2017-01-30 MED ORDER — DEXAMETHASONE SODIUM PHOSPHATE 10 MG/ML IJ SOLN
INTRAMUSCULAR | Status: DC | PRN
  Administered 2017-01-30: 10 mg via INTRAVENOUS

## 2017-01-30 MED ORDER — OXYCODONE HCL 5 MG PO TABS
10.0000 mg | ORAL_TABLET | ORAL | Status: DC | PRN
  Administered 2017-01-30 – 2017-02-03 (×18): 10 mg via ORAL
  Filled 2017-01-30 (×18): qty 2

## 2017-01-30 MED ORDER — TRANEXAMIC ACID 1000 MG/10ML IV SOLN
1000.0000 mg | INTRAVENOUS | Status: AC
  Administered 2017-01-30: 1000 mg via INTRAVENOUS
  Filled 2017-01-30: qty 1100

## 2017-01-30 MED ORDER — ONDANSETRON HCL 4 MG/2ML IJ SOLN
INTRAMUSCULAR | Status: AC
  Filled 2017-01-30: qty 2

## 2017-01-30 MED ORDER — ONDANSETRON HCL 4 MG PO TABS: 4.0000 mg | ORAL_TABLET | Freq: Four times a day (QID) | ORAL | Status: DC | PRN

## 2017-01-30 MED ORDER — EZETIMIBE 10 MG PO TABS
10.0000 mg | ORAL_TABLET | Freq: Every day | ORAL | Status: DC
  Administered 2017-01-31 – 2017-02-03 (×4): 10 mg via ORAL
  Filled 2017-01-30 (×4): qty 1

## 2017-01-30 MED ORDER — SODIUM CHLORIDE 0.9 % IV SOLN
INTRAVENOUS | Status: DC
  Administered 2017-01-30: 15:00:00 via INTRAVENOUS

## 2017-01-30 MED ORDER — ACETAMINOPHEN 325 MG PO TABS
650.0000 mg | ORAL_TABLET | ORAL | Status: DC | PRN
  Administered 2017-02-02: 18:00:00 650 mg via ORAL
  Filled 2017-01-30: qty 2

## 2017-01-30 MED ORDER — SODIUM CHLORIDE 0.9 % IJ SOLN
INTRAMUSCULAR | Status: AC
  Filled 2017-01-30: qty 10

## 2017-01-30 MED ORDER — METHOTREXATE SODIUM CHEMO INJECTION 50 MG/2ML
20.0000 mg | INTRAMUSCULAR | Status: DC
  Filled 2017-01-30: qty 0.8

## 2017-01-30 MED ORDER — HYDROMORPHONE HCL 2 MG/ML IJ SOLN
INTRAMUSCULAR | Status: AC
  Filled 2017-01-30: qty 1

## 2017-01-30 MED ORDER — METHOCARBAMOL 500 MG PO TABS
500.0000 mg | ORAL_TABLET | Freq: Four times a day (QID) | ORAL | Status: DC | PRN
  Administered 2017-01-30 – 2017-02-03 (×10): 500 mg via ORAL
  Filled 2017-01-30 (×10): qty 1

## 2017-01-30 MED ORDER — ONDANSETRON HCL 4 MG/2ML IJ SOLN: 4.0000 mg | Freq: Four times a day (QID) | INTRAMUSCULAR | Status: DC | PRN

## 2017-01-30 MED ORDER — TOPIRAMATE ER 50 MG PO SPRINKLE CAP24
50.0000 mg | EXTENDED_RELEASE_CAPSULE | Freq: Every day | ORAL | Status: DC
  Administered 2017-01-31 – 2017-02-02 (×3): 50 mg via ORAL

## 2017-01-30 MED ORDER — FENTANYL CITRATE (PF) 100 MCG/2ML IJ SOLN
INTRAMUSCULAR | Status: DC | PRN
  Administered 2017-01-30: 50 ug via INTRAVENOUS
  Administered 2017-01-30: 100 ug via INTRAVENOUS
  Administered 2017-01-30: 50 ug via INTRAVENOUS

## 2017-01-30 MED ORDER — LACTATED RINGERS IV SOLN
INTRAVENOUS | Status: DC
  Administered 2017-01-30: 1000 mL via INTRAVENOUS
  Administered 2017-01-30: 12:00:00 via INTRAVENOUS

## 2017-01-30 MED ORDER — LACTATED RINGERS IV SOLN: INTRAVENOUS | Status: DC

## 2017-01-30 SURGICAL SUPPLY — 38 items
APL SKNCLS STERI-STRIP NONHPOA (GAUZE/BANDAGES/DRESSINGS)
BAG SPEC THK2 15X12 ZIP CLS (MISCELLANEOUS)
BAG ZIPLOCK 12X15 (MISCELLANEOUS) IMPLANT
BENZOIN TINCTURE PRP APPL 2/3 (GAUZE/BANDAGES/DRESSINGS) IMPLANT
BLADE SAW SGTL 18X1.27X75 (BLADE) ×2 IMPLANT
BLADE SAW SGTL 18X1.27X75MM (BLADE) ×1
CAPT HIP TOTAL 2 ×2 IMPLANT
CLOSURE WOUND 1/2 X4 (GAUZE/BANDAGES/DRESSINGS)
COVER PERINEAL POST (MISCELLANEOUS) ×3 IMPLANT
COVER SURGICAL LIGHT HANDLE (MISCELLANEOUS) ×3 IMPLANT
DRAPE STERI IOBAN 125X83 (DRAPES) ×3 IMPLANT
DRAPE U-SHAPE 47X51 STRL (DRAPES) ×6 IMPLANT
DRESSING AQUACEL AG SP 3.5X10 (GAUZE/BANDAGES/DRESSINGS) IMPLANT
DRSG AQUACEL AG ADV 3.5X10 (GAUZE/BANDAGES/DRESSINGS) ×3 IMPLANT
DRSG AQUACEL AG SP 3.5X10 (GAUZE/BANDAGES/DRESSINGS) ×3
DURAPREP 26ML APPLICATOR (WOUND CARE) ×3 IMPLANT
ELECT REM PT RETURN 15FT ADLT (MISCELLANEOUS) ×3 IMPLANT
GAUZE XEROFORM 1X8 LF (GAUZE/BANDAGES/DRESSINGS) ×2 IMPLANT
GLOVE BIO SURGEON STRL SZ7.5 (GLOVE) ×3 IMPLANT
GLOVE BIOGEL PI IND STRL 8 (GLOVE) ×2 IMPLANT
GLOVE BIOGEL PI INDICATOR 8 (GLOVE) ×4
GLOVE ECLIPSE 8.0 STRL XLNG CF (GLOVE) ×3 IMPLANT
GOWN STRL REUS W/TWL XL LVL3 (GOWN DISPOSABLE) ×6 IMPLANT
HANDPIECE INTERPULSE COAX TIP (DISPOSABLE) ×3
HOLDER FOLEY CATH W/STRAP (MISCELLANEOUS) ×3 IMPLANT
PACK ANTERIOR HIP CUSTOM (KITS) ×3 IMPLANT
SET HNDPC FAN SPRY TIP SCT (DISPOSABLE) ×1 IMPLANT
STAPLER VISISTAT 35W (STAPLE) IMPLANT
STRIP CLOSURE SKIN 1/2X4 (GAUZE/BANDAGES/DRESSINGS) IMPLANT
SUT ETHIBOND NAB CT1 #1 30IN (SUTURE) ×3 IMPLANT
SUT MNCRL AB 4-0 PS2 18 (SUTURE) IMPLANT
SUT VIC AB 0 CT1 36 (SUTURE) ×3 IMPLANT
SUT VIC AB 1 CT1 36 (SUTURE) ×3 IMPLANT
SUT VIC AB 2-0 CT1 27 (SUTURE) ×6
SUT VIC AB 2-0 CT1 TAPERPNT 27 (SUTURE) ×2 IMPLANT
TRAY FOLEY W/METER SILVER 16FR (SET/KITS/TRAYS/PACK) ×3 IMPLANT
WATER STERILE IRR 1000ML POUR (IV SOLUTION) ×4 IMPLANT
YANKAUER SUCT BULB TIP 10FT TU (MISCELLANEOUS) ×3 IMPLANT

## 2017-01-30 NOTE — Anesthesia Preprocedure Evaluation (Signed)
Anesthesia Evaluation  Patient identified by MRN, date of birth, ID band Patient awake    Reviewed: Allergy & Precautions, NPO status , Patient's Chart, lab work & pertinent test results  Airway Mallampati: II  TM Distance: >3 FB Neck ROM: Full    Dental no notable dental hx.    Pulmonary neg pulmonary ROS,    Pulmonary exam normal breath sounds clear to auscultation       Cardiovascular negative cardio ROS Normal cardiovascular exam Rhythm:Regular Rate:Normal     Neuro/Psych TIAnegative psych ROS   GI/Hepatic negative GI ROS, Neg liver ROS,   Endo/Other  diabetes, Type 2, Oral Hypoglycemic Agents  Renal/GU negative Renal ROS  negative genitourinary   Musculoskeletal  (+) Arthritis , Rheumatoid disorders,    Abdominal   Peds negative pediatric ROS (+)  Hematology negative hematology ROS (+)   Anesthesia Other Findings   Reproductive/Obstetrics negative OB ROS                             Anesthesia Physical Anesthesia Plan  ASA: II  Anesthesia Plan: General   Post-op Pain Management:    Induction: Intravenous  PONV Risk Score and Plan: 3 and Ondansetron and Treatment may vary due to age or medical condition  Airway Management Planned: LMA and Oral ETT  Additional Equipment:   Intra-op Plan:   Post-operative Plan: Extubation in OR  Informed Consent: I have reviewed the patients History and Physical, chart, labs and discussed the procedure including the risks, benefits and alternatives for the proposed anesthesia with the patient or authorized representative who has indicated his/her understanding and acceptance.   Dental advisory given  Plan Discussed with: CRNA  Anesthesia Plan Comments:         Anesthesia Quick Evaluation

## 2017-01-30 NOTE — H&P (Signed)
TOTAL HIP ADMISSION H&P  Patient is admitted for right total hip arthroplasty.  Subjective:  Chief Complaint: right hip pain  HPI: [name], 71 y.o. female, has a history of pain and functional disability in the right hip(s) due to avascular necrosis and patient has failed non-surgical conservative treatments for greater than 12 weeks to include NSAID's and/or analgesics, corticosteriod injections, supervised PT with diminished ADL's post treatment, use of assistive devices, weight reduction as appropriate and activity modification.  Onset of symptoms was abrupt starting 1 years ago with rapidlly worsening course since that time.The patient noted no past surgery on the right hip(s).  Patient currently rates pain in the right hip at 10 out of 10 with activity. Patient has night pain, worsening of pain with activity and weight bearing, trendelenberg gait, pain that interfers with activities of daily living and pain with passive range of motion. Patient has evidence of subchondral cysts, subchondral sclerosis, periarticular osteophytes and joint space narrowing by imaging studies. This condition presents safety issues increasing the risk of falls.  There is no current active infection.  Patient Active Problem List   Diagnosis Date Noted  . Avascular necrosis of bone of hip, right (HCC) 12/18/2016  . Trochanteric bursitis, right hip 11/04/2016  . Pain of right hip joint 11/04/2016  . S/P lumbar spinal fusion 07/21/2015   Past Medical History:  Diagnosis Date  . Depression   . Diabetes mellitus without complication (HCC)   . Glaucoma   . Headache    Vestibular migraines  . History of bronchitis   . History of pneumonia   . RA (rheumatoid arthritis) (HCC)   . TIA (transient ischemic attack)   . Vertigo   . Wears glasses     Past Surgical History:  Procedure Laterality Date  . APPENDECTOMY    . BACK SURGERY  2012, 2017  . BILATERAL CARPAL TUNNEL RELEASE    . CESAREAN SECTION      x3  . GLAUCOMA SURGERY    . KNEE ARTHROSCOPY Left   . MOUTH SURGERY    . TONSILLECTOMY      Current Facility-Administered Medications  Medication Dose Route Frequency Provider Last Rate Last Dose  . clindamycin (CLEOCIN) 900 MG/50ML IVPB           . clindamycin (CLEOCIN) IVPB 900 mg  900 mg Intravenous On Call to OR Kathryne Hitch, MD      . lactated ringers infusion   Intravenous Continuous Phillips Grout, MD 100 mL/hr at 01/30/17 1058 1,000 mL at 01/30/17 1058  . tranexamic acid (CYKLOKAPRON) 1,000 mg in sodium chloride 0.9 % 100 mL IVPB  1,000 mg Intravenous To OR Kathryne Hitch, MD       Allergies  Allergen Reactions  . Penicillins Itching and Swelling    Has patient had a PCN reaction causing immediate rash, facial/tongue/throat swelling, SOB or lightheadedness with hypotension: Yes Has patient had a PCN reaction causing severe rash involving mucus membranes or skin necrosis: No Has patient had a PCN reaction that required hospitalization No Has patient had a PCN reaction occurring within the last 10 years: No If all of the above answers are "NO", then may proceed with Cephalosporin use.     Social History   Tobacco Use  . Smoking status: Never Smoker  . Smokeless tobacco: Never Used  Substance Use Topics  . Alcohol use: No    Family History  Problem Relation Age of Onset  . Diabetes Mother   .  Stroke Mother   . High blood pressure Father   . Stroke Father   . Diabetes Father      Review of Systems  Musculoskeletal: Positive for back pain and joint pain.  All other systems reviewed and are negative.   Objective:  Physical Exam  Constitutional: She is oriented to person, place, and time. She appears well-developed.  HENT:  Head: Normocephalic and atraumatic.  Eyes: Pupils are equal, round, and reactive to light.  Neck: Normal range of motion.  Cardiovascular: Normal rate.  Respiratory: Effort normal.  GI: Soft.  Musculoskeletal:        Right hip: She exhibits decreased range of motion, decreased strength, tenderness and bony tenderness.  Neurological: She is alert and oriented to person, place, and time.  Skin: Skin is warm and dry.  Psychiatric: She has a normal mood and affect.    Vital signs in last 24 hours: Temp:  [98.2 F (36.8 C)] 98.2 F (36.8 C) (12/27 0927) Pulse Rate:  [57] 57 (12/27 0927) Resp:  [18] 18 (12/27 0927) BP: (146)/(62) 146/62 (12/27 0927) SpO2:  [100 %] 100 % (12/27 0927) Weight:  [192 lb (87.1 kg)] 192 lb (87.1 kg) (12/27 0957)  Labs:   Estimated body mass index is 36.28 kg/m as calculated from the following:   Height as of this encounter: 5\' 1"  (1.549 m).   Weight as of this encounter: 192 lb (87.1 kg).   Imaging Review Plain radiographs and MRI demonstrate severe  AVN of the right hip(s). The bone quality appears to be good for age and reported activity level.  Assessment/Plan:  End stage avascular necrosis, right hip(s)  The patient history, physical examination, clinical judgement of the provider and imaging studies are consistent with end stage dAVN of the right hip(s) and total hip arthroplasty is deemed medically necessary. The treatment options including medical management, injection therapy, arthroscopy and arthroplasty were discussed at length. The risks and benefits of total hip arthroplasty were presented and reviewed. The risks due to aseptic loosening, infection, stiffness, dislocation/subluxation,  thromboembolic complications and other imponderables were discussed.  The patient acknowledged the explanation, agreed to proceed with the plan and consent was signed. Patient is being admitted for inpatient treatment for surgery, pain control, PT, OT, prophylactic antibiotics, VTE prophylaxis, progressive ambulation and ADL's and discharge planning.The patient is planning to be discharged home with home health services

## 2017-01-30 NOTE — Plan of Care (Signed)
df

## 2017-01-30 NOTE — Evaluation (Signed)
Physical Therapy Evaluation Patient Details Name: [name] MRN: [id number] DOB: Jan 05, 1946 Today's Date: 01/30/2017   History of Present Illness  71 yo f s/p Right DATHA, H/O back surgery  Clinical Impression  The patient with hypotension after transfer 102/39. BP 111/46 then 109/51 while in recliner. RN aware. Pt admitted with above diagnosis. Pt currently with functional limitations due to the deficits listed below (see PT Problem List).  Pt will benefit from skilled PT to increase their independence and safety with mobility to allow discharge to the venue listed below.       Follow Up Recommendations Home health PT    Equipment Recommendations  None recommended by PT    Recommendations for Other Services       Precautions / Restrictions Precautions Precautions: Fall Precaution Comments: low BP      Mobility  Bed Mobility Overal bed mobility: Needs Assistance Bed Mobility: Rolling;Sidelying to Sit Rolling: Mod assist;+2 for physical assistance;+2 for safety/equipment Sidelying to sit: Mod assist;+2 for physical assistance;+2 for safety/equipment;HOB elevated       General bed mobility comments: assist with legs and for trunk to upright  Transfers Overall transfer level: Needs assistance Equipment used: Rolling walker (2 wheeled) Transfers: Sit to/from [employer] Sit to Stand: Mod assist;+2 physical assistance;+2 safety/equipment;From elevated surface Stand pivot transfers: Mod assist;+2 physical assistance;+2 safety/equipment       General transfer comment: patient moving slowly, requires extra time. BP 102/39 after transfer to recliner, c/o dizziness and nausea  Ambulation/Gait                Stairs            Wheelchair Mobility    Modified Rankin (Stroke Patients Only)       Balance                                             Pertinent Vitals/Pain Pain Assessment: 0-10 Pain Score: 8   Pain Location: back and right hip Pain Descriptors / Indicators: Aching;Discomfort Pain Intervention(s): Limited activity within patient's tolerance;Monitored during session;Premedicated before session;RN gave pain meds during session;Repositioned;Ice applied    Home Living Family/patient expects to be discharged to:: Private residence Living Arrangements: Alone Available Help at Discharge: Family;Available PRN/intermittently Type of Home: House Home Access: Stairs to enter Entrance Stairs-Rails: Right Entrance Stairs-Number of Steps: 3 Home Layout: Two level;1/2 bath on main level;Bed/bath upstairs Home Equipment: Walker - 2 wheels      Prior Function Level of Independence: Independent               Hand Dominance        Extremity/Trunk Assessment   Upper Extremity Assessment Upper Extremity Assessment: Defer to OT evaluation    Lower Extremity Assessment Lower Extremity Assessment: RLE deficits/detail RLE Deficits / Details: at least 3+ /5    Cervical / Trunk Assessment Cervical / Trunk Assessment: Normal  Communication   Communication: No difficulties  Cognition Arousal/Alertness: Lethargic;Suspect due to medications Behavior During Therapy: [employer] for tasks assessed/performed Overall Cognitive Status: Within Functional Limits for tasks assessed                                        General Comments      Exercises  Assessment/Plan    PT Assessment Patient needs continued PT services  PT Problem List Decreased strength;Decreased range of motion;Decreased activity tolerance;Decreased mobility;Cardiopulmonary status limiting activity;Decreased knowledge of precautions;Decreased safety awareness;Decreased knowledge of use of DME;Pain       PT Treatment Interventions DME instruction;Therapeutic exercise;Gait training;Stair training;Functional mobility training;Therapeutic activities;Patient/family education    PT Goals (Current goals  can be found in the Care Plan section)  Acute Rehab PT Goals Patient Stated Goal: to go home PT Goal Formulation: With patient/family Time For Goal Achievement: 02/06/17 Potential to Achieve Goals: Good    Frequency 7X/week   Barriers to discharge        Co-evaluation               AM-PAC PT "6 Clicks" Daily Activity  Outcome Measure Difficulty turning over in bed (including adjusting bedclothes, sheets and blankets)?: Unable Difficulty moving from lying on back to sitting on the side of the bed? : Unable Difficulty sitting down on and standing up from a chair with arms (e.g., wheelchair, bedside commode, etc,.)?: Unable Help needed moving to and from a bed to chair (including a wheelchair)?: Total Help needed walking in hospital room?: Total Help needed climbing 3-5 steps with a railing? : Total 6 Click Score: 6    End of Session   Activity Tolerance: Patient tolerated treatment well;Patient limited by fatigue;Patient limited by lethargy;Patient limited by pain Patient left: with call bell/phone within reach;in chair Nurse Communication: Mobility status PT Visit Diagnosis: Difficulty in walking, not elsewhere classified (R26.2);Pain Pain - part of body: Hip    Time: 1478-2956 PT Time Calculation (min) (ACUTE ONLY): 23 min   Charges:   PT Evaluation $PT Eval Low Complexity: 1 Low PT Treatments $Therapeutic Activity: 8-22 mins   PT G Codes:        Rada Hay 01/30/2017, 5:05 PM

## 2017-01-30 NOTE — Brief Op Note (Signed)
01/30/2017  12:46 PM  PATIENT:  [name]  71 y.o. female  PRE-OPERATIVE DIAGNOSIS:  avascular necrosis right hip  POST-OPERATIVE DIAGNOSIS:  avascular necrosis right hip  PROCEDURE:  Procedure(s): RIGHT TOTAL HIP ARTHROPLASTY ANTERIOR APPROACH (Right)  SURGEON:  Surgeon(s) and Role:    Kathryne Hitch, MD - Primary  ANESTHESIA:   general  EBL:  350 mL   COUNTS:  YES  DICTATION: .Other Dictation: Dictation Number [id number]  PLAN OF CARE: Admit to inpatient   PATIENT DISPOSITION:  PACU - hemodynamically stable.   Delay start of Pharmacological VTE agent (>24hrs) due to surgical blood loss or risk of bleeding: no

## 2017-01-30 NOTE — Anesthesia Postprocedure Evaluation (Signed)
Anesthesia Post Note  Patient: [name]  Procedure(s) Performed: RIGHT TOTAL HIP ARTHROPLASTY ANTERIOR APPROACH (Right )     Patient location during evaluation: PACU Anesthesia Type: General Level of consciousness: awake and alert Pain management: pain level controlled Vital Signs Assessment: post-procedure vital signs reviewed and stable Respiratory status: spontaneous breathing, nonlabored ventilation, respiratory function stable and patient connected to nasal cannula oxygen Cardiovascular status: blood pressure returned to baseline and stable Postop Assessment: no apparent nausea or vomiting Anesthetic complications: no    Last Vitals:  Vitals:   01/30/17 1315 01/30/17 1330  BP: (!) 141/57 110/68  Pulse: 77 85  Resp: 18 17  Temp:    SpO2: 95% 98%    Last Pain:  Vitals:   01/30/17 1330  TempSrc:   PainSc: 5                  Phillips Grout

## 2017-01-30 NOTE — Anesthesia Procedure Notes (Signed)
Procedure Name: Intubation Date/Time: 01/30/2017 11:23 AM Performed by: Florene Route, CRNA Patient Re-evaluated:Patient Re-evaluated prior to induction Oxygen Delivery Method: Circle system utilized Preoxygenation: Pre-oxygenation with 100% oxygen Induction Type: IV induction Ventilation: Mask ventilation without difficulty and Oral airway inserted - appropriate to patient size Laryngoscope Size: Hyacinth Meeker and 2 Grade View: Grade II Tube type: Oral Tube size: 7.5 mm Number of attempts: 1 Airway Equipment and Method: Stylet Placement Confirmation: ETT inserted through vocal cords under direct vision,  positive ETCO2 and breath sounds checked- equal and bilateral Secured at: 20 cm Tube secured with: Tape Dental Injury: Teeth and Oropharynx as per pre-operative assessment

## 2017-01-30 NOTE — Transfer of Care (Signed)
Immediate Anesthesia Transfer of Care Note  Patient: [name]  Procedure(s) Performed: RIGHT TOTAL HIP ARTHROPLASTY ANTERIOR APPROACH (Right )  Patient Location: PACU  Anesthesia Type:General  Level of Consciousness: awake  Airway & Oxygen Therapy: Patient Spontanous Breathing and Patient connected to face mask oxygen  Post-op Assessment: Report given to RN and Post -op Vital signs reviewed and stable  Post vital signs: Reviewed and stable  Last Vitals:  Vitals:   01/30/17 0927  BP: (!) 146/62  Pulse: (!) 57  Resp: 18  Temp: 36.8 C  SpO2: 100%    Last Pain:  Vitals:   01/30/17 0957  TempSrc:   PainSc: 3       Patients Stated Pain Goal: 3 (01/30/17 0957)  Complications: No apparent anesthesia complications

## 2017-01-31 LAB — BASIC METABOLIC PANEL WITH GFR
Anion gap: 5 (ref 5–15)
BUN: 15 mg/dL (ref 6–20)
CO2: 25 mmol/L (ref 22–32)
Calcium: 9.6 mg/dL (ref 8.9–10.3)
Chloride: 108 mmol/L (ref 101–111)
Creatinine, Ser: 0.76 mg/dL (ref 0.44–1.00)
GFR calc Af Amer: >60 mL/min (ref >60)
GFR calc non Af Amer: >60 mL/min (ref >60)
Glucose, Bld: 142 mg/dL — ABNORMAL HIGH (ref 65–99)
Potassium: 3.9 mmol/L (ref 3.5–5.1)
Sodium: 138 mmol/L (ref 135–145)

## 2017-01-31 LAB — CBC
HEMATOCRIT: 30.2 % — AB (ref 36.0–46.0)
Hemoglobin: 9.9 g/dL — ABNORMAL LOW (ref 12.0–15.0)
MCH: 33.2 pg (ref 26.0–34.0)
MCHC: 32.8 g/dL (ref 30.0–36.0)
MCV: 101.3 fL — ABNORMAL HIGH (ref 78.0–100.0)
PLATELETS: 238 10*3/uL (ref 150–400)
RBC: 2.98 MIL/uL — ABNORMAL LOW (ref 3.87–5.11)
RDW: 14.5 % (ref 11.5–15.5)
WBC: 11.3 10*3/uL — AB (ref 4.0–10.5)

## 2017-01-31 NOTE — Progress Notes (Addendum)
Physical Therapy Treatment Patient Details Name: [name] MRN: [id number] DOB: 05-Jun-1945 Today's Date: 01/31/2017    History of Present Illness 71 yo f s/p Right DATHA, H/O back surgery    PT Comments    POD # 1 am session Assisted out of recliner to amb a limited distance.  Pt required increased assist to rise with difficulty self performing due to pain and fatigue.  Amb a limited distance with a very unsteady gait.  Moving slow.  Required increased time to complete task.  Performed some THR TE's AAROM followed by ICE.    Follow Up Recommendations  SNF(pt not progressing as expected)     Equipment Recommendations  None recommended by PT    Recommendations for Other Services       Precautions / Restrictions Precautions Precautions: Fall Restrictions Weight Bearing Restrictions: No    Mobility  Bed Mobility               General bed mobility comments: OOB in recliner  Transfers Overall transfer level: Needs assistance Equipment used: Rolling walker (2 wheeled) Transfers: Sit to/from Stand Sit to Stand: Min assist;Mod assist         General transfer comment: 50% VC's on proper hand placement and increased time    difficulty self rising    slow moving  Ambulation/Gait Ambulation/Gait assistance: Min guard;Min assist Ambulation Distance (Feet): 12 Feet Assistive device: Rolling walker (2 wheeled) Gait Pattern/deviations: Step-to pattern;Decreased step length - right;Decreased step length - left;Decreased stance time - right Gait velocity: decreased   General Gait Details: limited amb distance due to pain level, weakness and required increased VC's demonstrating impaired safety cognition and rentention   Stairs            Wheelchair Mobility    Modified Rankin (Stroke Patients Only)       Balance                                            Cognition Arousal/Alertness: Awake/alert Behavior During Therapy: WFL  for tasks assessed/performed Overall Cognitive Status: Within Functional Limits for tasks assessed                                        Exercises   Total Hip Replacement TE's 10 reps ankle pumps 10 reps knee presses 10 reps heel slides AAROM 10 reps ABD AAROM Followed by ICE     General Comments        Pertinent Vitals/Pain Pain Assessment: 0-10 Pain Score: 8  Pain Location: R hip Pain Descriptors / Indicators: Aching;Discomfort;Operative site guarding Pain Intervention(s): Monitored during session;Repositioned;Ice applied    Home Living Family/patient expects to be discharged to:: Private residence Living Arrangements: Alone Available Help at Discharge: Available PRN/intermittently         Home Equipment: Dan Humphreys - 2 wheels Additional Comments: nothing next to toilet to push up from    Prior Function Level of Independence: Independent          PT Goals (current goals can now be found in the care plan section) Acute Rehab PT Goals Patient Stated Goal: to go home Progress towards PT goals: Progressing toward goals    Frequency    7X/week      PT Plan Discharge plan needs to  be updated    Co-evaluation              AM-PAC PT "6 Clicks" Daily Activity  Outcome Measure  Difficulty turning over in bed (including adjusting bedclothes, sheets and blankets)?: Unable Difficulty moving from lying on back to sitting on the side of the bed? : Unable Difficulty sitting down on and standing up from a chair with arms (e.g., wheelchair, bedside commode, etc,.)?: Unable Help needed moving to and from a bed to chair (including a wheelchair)?: Total Help needed walking in hospital room?: Total Help needed climbing 3-5 steps with a railing? : Total 6 Click Score: 6    End of Session Equipment Utilized During Treatment: Gait belt Activity Tolerance: Patient limited by fatigue;Patient limited by pain Patient left: with call bell/phone within  reach;in chair Nurse Communication: Mobility status PT Visit Diagnosis: Difficulty in walking, not elsewhere classified (R26.2);Pain Pain - part of body: Hip     Time: 1150-1230 PT Time Calculation (min) (ACUTE ONLY): 40 min  Charges:  $Gait Training: 8-22 mins $Therapeutic Exercise: 8-22 mins $Therapeutic Activity: 8-22 mins                    G Codes:       Felecia Shelling  PTA [facility]  Acute  Rehab Pager      [phone]

## 2017-01-31 NOTE — Evaluation (Signed)
Occupational Therapy Evaluation Patient Details Name: [name] MRN: [id number] DOB: [birth year] Today's Date: 01/31/2017    History of Present Illness 71 yo f s/p Right DATHA, H/O back surgery   Clinical Impression   Pt was admitted for the above sx.  At baseline, she was mod I with adls; extra time/didn't wear socks.  She needs min A for transfers at this time and moves slowly.  Pt needs up to mod A for LB adls.  In addition to R hip pain, she has L knee and back pain. Goals in acute are for min guard assist    Follow Up Recommendations  SNF(pt has limited support; all daughters work)    Equipment Recommendations  3 in 1 bedside commode    Recommendations for Other Services       Precautions / Restrictions Precautions Precautions: Fall Restrictions Weight Bearing Restrictions: No      Mobility Bed Mobility               General bed mobility comments: oob  Transfers   Equipment used: Rolling walker (2 wheeled)   Sit to Stand: Min assist         General transfer comment: assist to rise and steady; scooted towards edge of chair and 3:1 prior to standing. Pt continues to move very slowly when walking    Balance                                           ADL either performed or assessed with clinical judgement   ADL Overall ADL's : Needs assistance/impaired Eating/Feeding: Independent   Grooming: Wash/dry hands;Supervision/safety;Standing   Upper Body Bathing: Set up;Sitting   Lower Body Bathing: Minimal assistance;Sit to/from stand;With adaptive equipment   Upper Body Dressing : Set up;Sitting   Lower Body Dressing: Moderate assistance;Sit to/from stand;With adaptive equipment   Toilet Transfer: Minimal assistance;Ambulation;BSC;RW   Toileting- Clothing Manipulation and Hygiene: Minimal assistance;Sit to/from stand         General ADL Comments: ambulated to bathroom slowly with cues for sequence. Pt would benefit  from youth walker on next visit.  Pt has reacher which she hasn't used for adls and sock aide, which she hasn't used. Explained uses of both. She still has back issues and I recommended that she use these to decrease pain in both hip and back. She also c/o L knee pain     Vision         Perception     Praxis      Pertinent Vitals/Pain Pain Score: 8  Pain Location: back and right hip Pain Descriptors / Indicators: Aching;Discomfort Pain Intervention(s): Limited activity within patient's tolerance;Monitored during session;Premedicated before session;Repositioned;Ice applied     Hand Dominance     Extremity/Trunk Assessment Upper Extremity Assessment Upper Extremity Assessment: Overall WFL for tasks assessed           Communication Communication Communication: No difficulties   Cognition Arousal/Alertness: Awake/alert Behavior During Therapy: WFL for tasks assessed/performed Overall Cognitive Status: Within Functional Limits for tasks assessed                                     General Comments       Exercises     Shoulder Instructions      Home Living Family/patient  expects to be discharged to:: Private residence Living Arrangements: Alone Available Help at Discharge: Available PRN/intermittently               Bathroom Shower/Tub: Tub/shower unit   Bathroom Toilet: Handicapped height     Home Equipment: Environmental consultant - 2 wheels   Additional Comments: nothing next to toilet to push up from      Prior Functioning/Environment Level of Independence: Independent                 OT Problem List: Decreased activity tolerance;Decreased knowledge of use of DME or AE;Decreased knowledge of precautions;Pain      OT Treatment/Interventions: Self-care/ADL training;DME and/or AE instruction;Patient/family education;Therapeutic activities    OT Goals(Current goals can be found in the care plan section) Acute Rehab OT Goals Patient Stated Goal:  to go home OT Goal Formulation: With patient Potential to Achieve Goals: Good ADL Goals Pt Will Perform Lower Body Bathing: with min guard assist;sit to/from stand;with adaptive equipment Pt Will Transfer to Toilet: with min guard assist;ambulating;bedside commode Pt Will Perform Toileting - Clothing Manipulation and hygiene: with min guard assist;sit to/from stand  OT Frequency: Min 2X/week   Barriers to D/C:            Co-evaluation              AM-PAC PT "6 Clicks" Daily Activity     Outcome Measure Help from another person eating meals?: None Help from another person taking care of personal grooming?: A Little Help from another person toileting, which includes using toliet, bedpan, or urinal?: A Little Help from another person bathing (including washing, rinsing, drying)?: A Little Help from another person to put on and taking off regular upper body clothing?: A Little Help from another person to put on and taking off regular lower body clothing?: A Lot 6 Click Score: 18   End of Session    Activity Tolerance: Patient limited by pain Patient left: in chair;with call bell/phone within reach;with family/visitor present  OT Visit Diagnosis: Pain Pain - Right/Left: Right Pain - part of body: Hip                Time: 1330-1403 OT Time Calculation (min): 33 min Charges:  OT General Charges $OT Visit: 1 Visit OT Evaluation $OT Eval Low Complexity: 1 Low OT Treatments $Self Care/Home Management : 8-22 mins G-Codes:     [name], [name] [phone] 01/31/2017  [name] 01/31/2017, 2:49 PM

## 2017-01-31 NOTE — Progress Notes (Signed)
Physical Therapy Treatment Patient Details Name: [name] MRN: [id number] DOB: 1945/03/01 Today's Date: 01/31/2017    History of Present Illness 71 yo f s/p Right DATHA, H/O back surgery    PT Comments    POD # 1 pm session.  Assisted with amb a second time and required nearly 9 min to complete 12 feet distance.  Slow.  Groggy.  Recliner following for safety.   Pt now agrees she needs ST Rehab at [facility].  Reported to RN.   Follow Up Recommendations  SNF(pt not progressing as expected)     Equipment Recommendations  None recommended by PT    Recommendations for Other Services       Precautions / Restrictions Precautions Precautions: Fall Restrictions Weight Bearing Restrictions: No    Mobility  Bed Mobility   Bed Mobility: Supine to Sit;Sit to Supine   Sidelying to sit: Mod assist;+2 for physical assistance;+2 for safety/equipment;HOB elevated Supine to sit: +2 for physical assistance;+2 for safety/equipment;Mod assist     General bed mobility comments: assisted back to bed  Transfers Overall transfer level: Needs assistance Equipment used: Rolling walker (2 wheeled) Transfers: Sit to/from Stand Sit to Stand: Min assist;Mod assist         General transfer comment: 50% VC's on proper hand placement and increased time    difficulty self rising    slow moving  Ambulation/Gait Ambulation/Gait assistance: Min guard;Min assist Ambulation Distance (Feet): 16 Feet Assistive device: Rolling walker (2 wheeled) Gait Pattern/deviations: Step-to pattern;Decreased step length - right;Decreased step length - left;Decreased stance time - right Gait velocity: decreased   General Gait Details: required nearly 9 min to amb 16 feet.  Very slow.  Unsteady.  Difficulty advancing either LE.    Stairs            Wheelchair Mobility    Modified Rankin (Stroke Patients Only)       Balance                                             Cognition Arousal/Alertness: Awake/alert Behavior During Therapy: WFL for tasks assessed/performed Overall Cognitive Status: Within Functional Limits for tasks assessed                                        Exercises      General Comments        Pertinent Vitals/Pain Pain Assessment: 0-10 Pain Score: 8  Pain Location: R hip Pain Descriptors / Indicators: Aching;Discomfort;Operative site guarding Pain Intervention(s): Monitored during session;Repositioned;Ice applied    Home Living Family/patient expects to be discharged to:: Private residence Living Arrangements: Alone Available Help at Discharge: Available PRN/intermittently         Home Equipment: Dan Humphreys - 2 wheels Additional Comments: nothing next to toilet to push up from    Prior Function Level of Independence: Independent          PT Goals (current goals can now be found in the care plan section) Acute Rehab PT Goals Patient Stated Goal: to go home Progress towards PT goals: Progressing toward goals    Frequency    7X/week      PT Plan Discharge plan needs to be updated    Co-evaluation  AM-PAC PT "6 Clicks" Daily Activity  Outcome Measure  Difficulty turning over in bed (including adjusting bedclothes, sheets and blankets)?: Unable Difficulty moving from lying on back to sitting on the side of the bed? : Unable Difficulty sitting down on and standing up from a chair with arms (e.g., wheelchair, bedside commode, etc,.)?: Unable Help needed moving to and from a bed to chair (including a wheelchair)?: Total Help needed walking in hospital room?: Total Help needed climbing 3-5 steps with a railing? : Total 6 Click Score: 6    End of Session Equipment Utilized During Treatment: Gait belt Activity Tolerance: Patient limited by fatigue;Patient limited by pain Patient left: with call bell/phone within reach;in chair Nurse Communication: Mobility status PT Visit  Diagnosis: Difficulty in walking, not elsewhere classified (R26.2);Pain Pain - part of body: Hip     Time: 8115-7262 PT Time Calculation (min) (ACUTE ONLY): 30 min  Charges:  $Gait Training: 8-22 mins $Therapeutic Activity: 8-22 mins                    G Codes:       Felecia Shelling  PTA [facility]  Acute  Rehab Pager      [phone]

## 2017-01-31 NOTE — Progress Notes (Signed)
Discharge planning, spoke with patient and daughters at bedside. Have chosen [facility] for [facility] PT. Contacted [facility] for referral. Has RW but needs 3n1, contacted [facility] to deliver to room. [phone]

## 2017-01-31 NOTE — Op Note (Signed)
Amanda, [name]               ACCOUNT NO.:  [id number]  MEDICAL RECORD NO.:  [id number]  LOCATION:  [facility]                         FACILITY:  [facility]  PHYSICIAN:  Vanita Panda. Magnus Ivan, M.D.DATE OF BIRTH:  12/25/1945  DATE OF PROCEDURE:  01/30/2017 DATE OF DISCHARGE:                              OPERATIVE REPORT   PREOPERATIVE DIAGNOSIS:  Avascular necrosis with combined osteoarthritis, right hip.  POSTOPERATIVE DIAGNOSIS:  Avascular necrosis with combined osteoarthritis, right hip.  PROCEDURE:  Right total hip arthroplasty through direct anterior approach.  IMPLANTS:  DePuy Sector Gription acetabular component size #50, size #32 +0 neutral polyethylene liner, size #12 Corail femoral component with varus offset, size #32 +1 ceramic hip ball.  SURGEON:  Vanita Panda. Magnus Ivan, M.D.  ASSISTANT:  Richardean Canal, PA-C.  ANESTHESIA:  General.  ANTIBIOTICS:  900 mg of IV clindamycin.  BLOOD LOSS:  300-350 mL.  COMPLICATIONS:  None.  INDICATIONS:  [name] is a 71 year old female with debilitating right hip pain.  She also has had significant operation on lumbar spine. Her neurosurgeon felt it was hip related.  Intra-articular injections did not help whatsoever so I did obtain an MRI of her hip which showed a component of avascular necrosis.  It started to definitely lean to be more of her hip being a hip tissue, and eventually, we were convinced that it was definitely her hip.  Her pain is daily and it is 10/10.  She has tried and failed all forms of conservative treatment.  Additionally, it has affected her activities of daily living and quality of life and mobility.  With total hip arthroplasty, she understands the risk of acute blood loss anemia, nerve and vessel injury, fracture, infection, dislocation, DVT.  She understands our goals are to decrease pain, improve mobility, and overall improved quality of life.  PROCEDURE DESCRIPTION:  After informed consent was  obtained, appropriate right hip was marked.  She was brought to the operating room, where general anesthesia was obtained while she was on her stretcher.  A Foley catheter was placed and both feet had traction boots applied to them. Next, she was placed supine on the Hana fracture table, the perineal post in place, and both legs in inline skeletal traction devices, but no traction applied.  Her right operative hip was prepped and draped with DuraPrep and sterile drapes.  Time-out was called and she was identified as correct patient and correct right hip.  We then made an incision inferior and posterior to the anterior superior iliac spine and carried this obliquely down the leg.  We dissected down to tensor fascia lata muscle.  Tensor fascia was then divided longitudinally to proceed with a direct anterior approach to the hip.  We identified and cauterized circumflex vessels, then identified the hip capsule.  I opened up the hip capsule in an L-type format, finding significant joint effusion and significant disease in the right hip socket.  We placed Cobra retractors on the medial and lateral femoral neck and made our femoral neck cut with an oscillating saw and completed this with an osteotome.  I placed a corkscrew guide in the femoral head and removed the femoral head in its entirety and  found to be having areas of flaking of cartilage as well as some areas of devoid of cartilage.  We then cleaned the acetabular remnants of acetabular labrum and other debris.  I placed a bent Hohmann over the medial acetabular rim and then began reaming from a size #42 reamer going up in stepwise increments to a size #50 with all reamers under direct visualization with the last reamer under direct fluoroscopy, so I could obtain my depth of reaming, inclination, and anteversion.  I then placed the real DePuy Sector Gription acetabular component size #50 under direct fluoroscopy, so we could obtain  our positioning of the hip with appropriate anteversion and inclination. Once that was well seated, we placed the real 32 +0 polyethylene liner for that size of the acetabular component.  Attention was then turned to the femur.  With the leg externally rotated to 120 degrees extended and adducted, we were able to place a Mueller retractor medially and a Hohmann retractor behind the greater trochanter.  We released the lateral joint capsule and used a box cutting osteotome to enter the femoral canal and a rongeur to lateralize.  We then began broaching.  We used a size #8 broach using the Corail Broaching System, going up to a size #12.  With a 12 in place, we trialed a varus offset femoral neck and a 36 +1 hip ball.  We reduced this in the acetabulum and I was pleased with stability, but I felt like we needed just a little bit more leg length and offset.  We dislocated the hip and removed the trial components.  We placed the real Corail femoral component with varus offset size #12 and the real 32 +5 ceramic hip ball. Reduced this in the acetabulum, and that I was definitely pleased with leg length, offset, range of motion, and stability.  We then irrigated the soft tissue with normal saline solution using pulsatile lavage.  I was able to close the joint capsule with interrupted #1 Ethibond suture followed by running #1 Vicryl in the tensor fascia, 0 Vicryl in the deep tissue, 2-0 Vicryl in the subcutaneous tissue, interrupted staples on the skin.  Xeroform and Aquacel dressing were applied.  She was taken off the Hana table, awakened, extubated, and taken to the recovery room in stable condition. All final counts were correct.  There were no complications noted.     Vanita Panda. Magnus Ivan, M.D.     CYB/MEDQ  D:  01/30/2017  T:  01/31/2017  Job:  [id number]

## 2017-01-31 NOTE — Progress Notes (Signed)
Subjective: 1 Day Post-Op Procedure(s) (LRB): RIGHT TOTAL HIP ARTHROPLASTY ANTERIOR APPROACH (Right) Patient reports pain as moderate.  Asymptomatic acute blood loss anmeia from surgery.  Objective: Vital signs in last 24 hours: Temp:  [97.4 F (36.3 C)-98.8 F (37.1 C)] 98.3 F (36.8 C) (12/28 0456) Pulse Rate:  [52-91] 57 (12/28 0456) Resp:  [11-18] 15 (12/28 0456) BP: (105-146)/(41-68) 123/49 (12/28 0456) SpO2:  [95 %-100 %] 99 % (12/28 0456) Weight:  [192 lb (87.1 kg)] 192 lb (87.1 kg) (12/27 1420)  Intake/Output from previous day: 12/27 0701 - 12/28 0700 In: 3605 [P.O.:840; I.V.:2450; IV Piggyback:315] Out: 3500 [Urine:3150; Blood:350] Intake/Output this shift: No intake/output data recorded.  Recent Labs    01/31/17 0505  HGB 9.9*   Recent Labs    01/31/17 0505  WBC 11.3*  RBC 2.98*  HCT 30.2*  PLT 238   Recent Labs    01/31/17 0505  NA 138  K 3.9  CL 108  CO2 25  BUN 15  CREATININE 0.76  GLUCOSE 142*  CALCIUM 9.6   No results for input(s): LABPT, INR in the last 72 hours.  Sensation intact distally Intact pulses distally Dorsiflexion/Plantar flexion intact Incision: scant drainage  Assessment/Plan: 1 Day Post-Op Procedure(s) (LRB): RIGHT TOTAL HIP ARTHROPLASTY ANTERIOR APPROACH (Right) Up with therapy Discharge home with home health next 1-2 days.  Kathryne Hitch 01/31/2017, 7:44 AM

## 2017-02-01 LAB — CBC
HCT: 33.8 % — ABNORMAL LOW (ref 36.0–46.0)
HEMOGLOBIN: 10.7 g/dL — AB (ref 12.0–15.0)
MCH: 32.3 pg (ref 26.0–34.0)
MCHC: 31.7 g/dL (ref 30.0–36.0)
MCV: 102.1 fL — AB (ref 78.0–100.0)
Platelets: 251 10*3/uL (ref 150–400)
RBC: 3.31 MIL/uL — AB (ref 3.87–5.11)
RDW: 14.3 % (ref 11.5–15.5)
WBC: 11.5 10*3/uL — ABNORMAL HIGH (ref 4.0–10.5)

## 2017-02-01 NOTE — Progress Notes (Signed)
Subjective: 2 Days Post-Op Procedure(s) (LRB): RIGHT TOTAL HIP ARTHROPLASTY ANTERIOR APPROACH (Right) Patient reports pain as moderate.  Working slowly with therapy.  Requesting short-term skilled nursing.  Objective: Vital signs in last 24 hours: Temp:  [98.5 F (36.9 C)-99 F (37.2 C)] 98.5 F (36.9 C) (12/29 0603) Pulse Rate:  [63-81] 81 (12/29 0603) Resp:  [14-16] 16 (12/29 0603) BP: (106-118)/(40-50) 107/50 (12/29 0603) SpO2:  [95 %-100 %] 96 % (12/29 0603)  Intake/Output from previous day: 12/28 0701 - 12/29 0700 In: 1420 [P.O.:840; I.V.:580] Out: 750 [Urine:750] Intake/Output this shift: Total I/O In: 240 [P.O.:240] Out: -   Recent Labs    01/31/17 0505 02/01/17 0625  HGB 9.9* 10.7*   Recent Labs    01/31/17 0505 02/01/17 0625  WBC 11.3* 11.5*  RBC 2.98* 3.31*  HCT 30.2* 33.8*  PLT 238 251   Recent Labs    01/31/17 0505  NA 138  K 3.9  CL 108  CO2 25  BUN 15  CREATININE 0.76  GLUCOSE 142*  CALCIUM 9.6   No results for input(s): LABPT, INR in the last 72 hours.  Sensation intact distally Intact pulses distally Dorsiflexion/Plantar flexion intact Incision: dressing C/D/I  Assessment/Plan: 2 Days Post-Op Procedure(s) (LRB): RIGHT TOTAL HIP ARTHROPLASTY ANTERIOR APPROACH (Right) Up with therapy Discharge to SNF Monday  Kathryne Hitch 02/01/2017, 9:08 AM

## 2017-02-01 NOTE — Progress Notes (Signed)
Physical Therapy Treatment Patient Details Name: Genova Kiner MRN: [id number] DOB: 1945-04-02 Today's Date: 02/01/2017    History of Present Illness 71 yo f s/p Right DATHA, H/O back surgery    PT Comments    POD # 2 am session Pt OOB in recliner (slept in recliner) to amb a greater distance.  Still requires much assist and progressing slowly.  Returned to room then performed some THR TE's followed by ICE.   Pt will need ST Rehab prior to D/C home alone.   Follow Up Recommendations  SNF     Equipment Recommendations  None recommended by PT    Recommendations for Other Services       Precautions / Restrictions Precautions Precautions: Fall Restrictions Weight Bearing Restrictions: No Other Position/Activity Restrictions: WBAT    Mobility  Bed Mobility               General bed mobility comments: OOB in recliner  Transfers Overall transfer level: Needs assistance Equipment used: Rolling walker (2 wheeled) Transfers: Sit to/from Stand   Stand pivot transfers: Min assist;Mod assist       General transfer comment: 50% VC's on proper hand placement and increased time    difficulty self rising    slow moving  Ambulation/Gait Ambulation/Gait assistance: Min guard;Min assist Ambulation Distance (Feet): 22 Feet Assistive device: Rolling walker (2 wheeled) Gait Pattern/deviations: Step-to pattern;Decreased step length - right;Decreased step length - left;Decreased stance time - right Gait velocity: decreased   General Gait Details: tolerated an increased distance with increased speed.  still unsteady with difficulty advancing R LE due to upper thigh "burning".    Stairs            Wheelchair Mobility    Modified Rankin (Stroke Patients Only)       Balance                                            Cognition Arousal/Alertness: Awake/alert Behavior During Therapy: WFL for tasks assessed/performed Overall Cognitive  Status: Within Functional Limits for tasks assessed                                        Exercises   Total Hip Replacement TE's 10 reps ankle pumps 10 reps knee presses 10 reps heel slides 10 reps SAQ's 10 reps ABD Followed by ICE     General Comments        Pertinent Vitals/Pain Pain Assessment: 0-10 Pain Score: 5  Pain Location: R hip Pain Descriptors / Indicators: Aching;Discomfort;Operative site guarding Pain Intervention(s): Monitored during session;Repositioned    Home Living                      Prior Function            PT Goals (current goals can now be found in the care plan section) Progress towards PT goals: Progressing toward goals    Frequency    7X/week      PT Plan Discharge plan needs to be updated    Co-evaluation              AM-PAC PT "6 Clicks" Daily Activity  Outcome Measure  Difficulty turning over in bed (including adjusting bedclothes, sheets and blankets)?: Unable Difficulty moving  from lying on back to sitting on the side of the bed? : Unable Difficulty sitting down on and standing up from a chair with arms (e.g., wheelchair, bedside commode, etc,.)?: Unable Help needed moving to and from a bed to chair (including a wheelchair)?: Total Help needed walking in hospital room?: Total Help needed climbing 3-5 steps with a railing? : Total 6 Click Score: 6    End of Session Equipment Utilized During Treatment: Gait belt Activity Tolerance: Patient limited by fatigue;Patient limited by pain Patient left: with call bell/phone within reach;in chair Nurse Communication: Mobility status PT Visit Diagnosis: Difficulty in walking, not elsewhere classified (R26.2);Pain Pain - part of body: Hip     Time: 1130-1155 PT Time Calculation (min) (ACUTE ONLY): 25 min  Charges:  $Gait Training: 8-22 mins $Therapeutic Exercise: 8-22 mins                    G Codes:       Felecia Shelling  PTA [facility]  Acute   Rehab Pager      [phone]

## 2017-02-01 NOTE — Clinical Social Work Note (Signed)
Clinical Social Work Assessment  Patient Details  Name: [name] MRN: [id number] Date of Birth: 1945-08-28  Date of referral:  02/01/17               Reason for consult:  Facility Placement, Discharge Planning                Permission sought to share information with:  Case Manager, Magazine features editor, Family Supports Permission granted to share information::  Yes, Verbal Permission Granted  Name::        Agency::  SNF  Relationship::  Daughters  Contact Information:     Housing/Transportation Living arrangements for the past 2 months:  Single Family Home Source of Information:  Patient Patient Interpreter Needed:  None Criminal Activity/Legal Involvement Pertinent to Current Situation/Hospitalization:  No - Comment as needed Significant Relationships:  Adult Children Lives with:  Self Do you feel safe going back to the place where you live?  Yes Need for family participation in patient care:  Yes (Comment)  Care giving concerns:  Patient lives alone. She has two daughters that live in the area, however they work full time.    Social Worker assessment / plan:  LCSW following for SNF placement.   Patient admitted to hospital for hip replacement.   Patient was originally to [place] home with home health. According to PT notes patient is not progressing as expected and will need SNF.   Patient reports that prior to surgery she was independent in all ADLs. Including walking without assistance and driving. Patient reports that she has a part time job that she hopes to return to.   Patient reports that she is widowed and lives alone. She reports that she has 4 daughters. Two reside in [place] and the others are out of state. Patient reports that her daughters are supportive, however they work full time.   Patient is agreeable to SNF placement. Patient prefers [facility]   PLAN: Patient will go to SNF at [place].   Employment status:  Part-Time Glass blower/designer PT Recommendations:  Skilled Nursing Facility Information / Referral to community resources:  Skilled Nursing Facility  Patient/Family's Response to care:  Patient expressed concerns about getting back to her baseline and being able to return to her part time job. Patient is thankful of LCSW visit.   Patient/Family's Understanding of and Emotional Response to Diagnosis, Current Treatment, and Prognosis:  Patient is understanding of diagnosis and agreeable to current treatment plan.   Emotional Assessment Appearance:  Appears stated age Attitude/Demeanor/Rapport:    Affect (typically observed):  Calm, Hopeful Orientation:  Oriented to Self, Oriented to Place, Oriented to  Time, Oriented to Situation Alcohol / Substance use:  Not Applicable Psych involvement (Current and /or in the community):  No (Comment)  Discharge Needs  Concerns to be addressed:  No discharge needs identified Readmission within the last 30 days:  No Current discharge risk:  Lives alone Barriers to Discharge:  Continued Medical Work up   [employer], LCSW 02/01/2017, 11:53 AM

## 2017-02-01 NOTE — NC FL2 (Signed)
[place] MEDICAID FL2 LEVEL OF CARE SCREENING TOOL     IDENTIFICATION  Patient Name: [name] Birthdate: [birth year] Sex: female Admission Date (Current Location): 01/30/2017  [facility] and [place] Number:  Producer, television/film/video and Address:  [facility],  [address] [place], [place] [address]      Provider Number: [id number]  Attending Physician Name and Address:  Kathryne Hitch  Relative Name and Phone Number:       Current Level of Care: Hospital Recommended Level of Care: Skilled Nursing Facility Prior Approval Number:    Date Approved/Denied: 02/01/17 PASRR Number: [id number]  Discharge Plan: SNF    Current Diagnoses: Patient Active Problem List   Diagnosis Date Noted  . 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 Avascular necrosis of bone of hip, right (HCC) 12/18/2016  . Trochanteric bursitis, right hip 11/04/2016  . Pain of right hip joint 11/04/2016  . S/P lumbar spinal fusion 07/21/2015    Orientation RESPIRATION BLADDER Height & Weight     Self, Time, Situation, Place  Normal Continent Weight: 192 lb (87.1 kg) Height:  5\' 1"  (154.9 cm)  BEHAVIORAL SYMPTOMS/MOOD NEUROLOGICAL BOWEL NUTRITION STATUS      Continent Diet(See DC summary)  AMBULATORY STATUS COMMUNICATION OF NEEDS Skin   Extensive Assist Verbally Surgical wounds(Back)                       Personal Care Assistance Level of Assistance  Bathing, Feeding, Dressing Bathing Assistance: Limited assistance Feeding assistance: Independent Dressing Assistance: Limited assistance     Functional Limitations Info  Speech, Hearing, Sight Sight Info: Adequate Hearing Info: Adequate Speech Info: Adequate    SPECIAL CARE FACTORS FREQUENCY  PT (By licensed PT), OT (By licensed OT)     PT Frequency: 5x/week OT Frequency: 5x/week            Contractures      Additional Factors Info  Code Status, Allergies Code Status Info:  Full Allergies Info: Penicillins           Current Medications (02/01/2017):  This is the current hospital active medication list Current Facility-Administered Medications  Medication Dose Route Frequency Provider Last Rate Last Dose  . 0.9 %  sodium chloride infusion   Intravenous Continuous 02/03/2017, MD 75 mL/hr at 01/30/17 1523    . acetaminophen (TYLENOL) tablet 650 mg  650 mg Oral Q4H PRN 02/01/17, MD       Or  . acetaminophen (TYLENOL) suppository 650 mg  650 mg Rectal Q4H PRN Kathryne Hitch, MD      . alum & mag hydroxide-simeth (MAALOX/MYLANTA) 200-200-20 MG/5ML suspension 30 mL  30 mL Oral Q4H PRN 08-13-2000, MD      . aspirin chewable tablet 81 mg  81 mg Oral BID Kathryne Hitch, MD   81 mg at 02/01/17 0948  . diphenhydrAMINE (BENADRYL) 12.5 MG/5ML elixir 12.5-25 mg  12.5-25 mg Oral Q4H PRN 03-06-1984, MD      . docusate sodium (COLACE) capsule 100 mg  100 mg Oral BID Kathryne Hitch, MD   100 mg at 02/01/17 0948  . ezetimibe (ZETIA) tablet 10 mg  10 mg Oral Daily 02/03/17, MD   10 mg at 02/01/17 0948  . fenofibrate tablet 54 mg  54 mg Oral Daily 02/03/17, MD   54 mg at 02/01/17 0948  . HYDROcodone-acetaminophen (NORCO/VICODIN) 5-325 MG per  tablet 1-2 tablet  1-2 tablet Oral Q4H PRN Kathryne Hitch, MD   2 tablet at 02/01/17 [id number]  . HYDROmorphone (DILAUDID) injection 1 mg  1 mg Intravenous Q2H PRN Kathryne Hitch, MD   1 mg at 01/31/17 1702  . hydrOXYzine (ATARAX/VISTARIL) tablet 25 mg  25 mg Oral Daily Kathryne Hitch, MD   25 mg at 02/01/17 0948  . menthol-cetylpyridinium (CEPACOL) lozenge 3 mg  1 lozenge Oral PRN Kathryne Hitch, MD       Or  . phenol (CHLORASEPTIC) mouth spray 1 spray  1 spray Mouth/Throat PRN Kathryne Hitch, MD      . methocarbamol (ROBAXIN) tablet 500 mg  500 mg Oral Q6H PRN Kathryne Hitch, MD   500  mg at 02/01/17 0152   Or  . methocarbamol (ROBAXIN) 500 mg in dextrose 5 % 50 mL IVPB  500 mg Intravenous Q6H PRN Kathryne Hitch, MD   Stopped at 01/30/17 1336  . [START ON 02/03/2017] methotrexate chemo injection 20 mg  20 mg Intravenous Q Lauraine Rinne, Vanita Panda, MD      . metoCLOPramide (REGLAN) tablet 5-10 mg  5-10 mg Oral Q8H PRN Kathryne Hitch, MD       Or  . metoCLOPramide (REGLAN) injection 5-10 mg  5-10 mg Intravenous Q8H PRN Kathryne Hitch, MD      . ondansetron [facility]) tablet 4 mg  4 mg Oral Q6H PRN Kathryne Hitch, MD       Or  . ondansetron [facility]) injection 4 mg  4 mg Intravenous Q6H PRN Kathryne Hitch, MD      . oxyCODONE (Oxy IR/ROXICODONE) immediate release tablet 10 mg  10 mg Oral Q3H PRN Kathryne Hitch, MD   10 mg at 02/01/17 0152  . pioglitazone (ACTOS) tablet 30 mg  30 mg Oral Daily Kathryne Hitch, MD   30 mg at 02/01/17 0948  . polyethylene glycol (MIRALAX / GLYCOLAX) packet 17 g  17 g Oral Daily PRN Kathryne Hitch, MD      . Topiramate ER (QUDEXY XR) CS24 50 mg  50 mg Oral QHS Kathryne Hitch, MD   50 mg at 01/31/17 2212     Discharge Medications: Please see discharge summary for a list of discharge medications.  Relevant Imaging Results:  Relevant Lab Results:   Additional Information ssn:   Coralyn Helling, LCSW

## 2017-02-02 NOTE — Progress Notes (Signed)
Physical Therapy Treatment Patient Details Name: [name] MRN: [id number] DOB: 04-28-1945 Today's Date: 02/02/2017    History of Present Illness 71 yo f s/p Right DATHA, H/O back surgery    PT Comments    The Patient is progressing slowly. Recommend SNF as patient lives alone and house is multilevel.    Follow Up Recommendations  SNF     Equipment Recommendations  None recommended by PT    Recommendations for Other Services       Precautions / Restrictions Restrictions Other Position/Activity Restrictions: WBAT    Mobility  Bed Mobility Overal bed mobility: Needs Assistance         Sit to supine: Mod assist   General bed mobility comments: assist with  lboth legs onto bed.   Transfers Overall transfer level: Needs assistance Equipment used: Rolling walker (2 wheeled) Transfers: Sit to/from Stand Sit to Stand: Min assist         General transfer comment:  VC's on proper hand placement and increased time ,   difficulty self assisting   and getting the legs onto the bed, goes to the side first due to back surgeries.  Ambulation/Gait Ambulation/Gait assistance: Min assist Ambulation Distance (Feet): 50 Feet(x 2) Assistive device: Rolling walker (2 wheeled) Gait Pattern/deviations: Step-to pattern;Step-through pattern Gait velocity: decreased   General Gait Details: increased distance, slow speed   Stairs            Wheelchair Mobility    Modified Rankin (Stroke Patients Only)       Balance                                            Cognition Arousal/Alertness: Awake/alert                                            Exercises Total Joint Exercises Ankle Circles/Pumps: AROM;10 reps;Both Quad Sets: AROM;Both Short Arc Quad: AROM;Right;10 reps Heel Slides: AAROM;Right;10 reps Hip ABduction/ADduction: AAROM;Right;10 reps    General Comments        Pertinent Vitals/Pain Pain Score: 3   Pain Location: R hip Pain Descriptors / Indicators: Aching;Discomfort;Operative site guarding Pain Intervention(s): Monitored during session;Premedicated before session    Home Living                      Prior Function            PT Goals (current goals can now be found in the care plan section) Progress towards PT goals: Progressing toward goals    Frequency    7X/week      PT Plan Discharge plan needs to be updated    Co-evaluation              AM-PAC PT "6 Clicks" Daily Activity  Outcome Measure  Difficulty turning over in bed (including adjusting bedclothes, sheets and blankets)?: Unable Difficulty moving from lying on back to sitting on the side of the bed? : Unable Difficulty sitting down on and standing up from a chair with arms (e.g., wheelchair, bedside commode, etc,.)?: Unable Help needed moving to and from a bed to chair (including a wheelchair)?: A Little Help needed walking in hospital room?: A Little Help needed climbing 3-5 steps with a railing? :  Total 6 Click Score: 10    End of Session   Activity Tolerance: Patient tolerated treatment well Patient left: in bed;with call bell/phone within reach;with family/visitor present Nurse Communication: Mobility status PT Visit Diagnosis: Difficulty in walking, not elsewhere classified (R26.2);Pain Pain - Right/Left: Right Pain - part of body: Hip     Time: 1209-1248 PT Time Calculation (min) (ACUTE ONLY): 39 min  Charges:  $Gait Training: 8-22 mins $Therapeutic Exercise: 8-22 mins $Self Care/Home Management: 2022-10-13                    G Codes:      Rada Hay 02/02/2017, 2:07 PM Blanchard Kelch PT [phone]

## 2017-02-02 NOTE — Progress Notes (Signed)
  Pt noted to have fits size circle  discoloration, next to surgical bandage measuring 12 inches in length and 9 1/2 inches in width. No discomfort  noted, states that it is warm to touch, circumference measured and marked to assess if it is spreading. I will continue to monitor.

## 2017-02-02 NOTE — Progress Notes (Signed)
Subjective: 3 Days Post-Op Procedure(s) (LRB): RIGHT TOTAL HIP ARTHROPLASTY ANTERIOR APPROACH (Right) Patient reports pain as mild.  Doing well this am.  Working with PT,  But progressing slowly.  No nausea/vomiting, lightheadedness/dizziness, chest pain/sob.  Objective: Vital signs in last 24 hours: Temp:  [97.5 F (36.4 C)-99.4 F (37.4 C)] 98.8 F (37.1 C) (12/30 0621) Pulse Rate:  [66-72] 67 (12/30 0621) Resp:  [15-18] 15 (12/30 0621) BP: (102-113)/(47-49) 113/49 (12/30 0621) SpO2:  [95 %-98 %] 96 % (12/30 0621)  Intake/Output from previous day: 12/29 0701 - 12/30 0700 In: 1200 [P.O.:1200] Out: 1350 [Urine:1350] Intake/Output this shift: Total I/O In: 360 [P.O.:360] Out: -   Recent Labs    01/31/17 0505 02/01/17 0625  HGB 9.9* 10.7*   Recent Labs    01/31/17 0505 02/01/17 0625  WBC 11.3* 11.5*  RBC 2.98* 3.31*  HCT 30.2* 33.8*  PLT 238 251   Recent Labs    01/31/17 0505  NA 138  K 3.9  CL 108  CO2 25  BUN 15  CREATININE 0.76  GLUCOSE 142*  CALCIUM 9.6   No results for input(s): LABPT, INR in the last 72 hours.  Neurologically intact Neurovascular intact Sensation intact distally Intact pulses distally Dorsiflexion/Plantar flexion intact Incision: dressing C/D/I No cellulitis present Compartment soft  Circled area of mild erythema just lateral to the distal aspect of bandage is not concerning for infection.  There is mild erythema.  No warmth or tenseness.  Assessment/Plan: 3 Days Post-Op Procedure(s) (LRB): RIGHT TOTAL HIP ARTHROPLASTY ANTERIOR APPROACH (Right) Advance diet Up with therapy Discharge to SNF likely tomorrow (she is progressing slowly with PT and she does not have anyone at home to care for her) ABLA-mild and stable I am not concerned for infection with circled area of mild erythema  Dry dressing change prn  Cristie Hem 02/02/2017, 10:20 AM

## 2017-02-03 MED ORDER — OXYCODONE-ACETAMINOPHEN 5-325 MG PO TABS: 1.0000 | ORAL_TABLET | ORAL | 0 refills | Status: DC | PRN

## 2017-02-03 MED ORDER — METHOCARBAMOL 500 MG PO TABS: 500.0000 mg | ORAL_TABLET | Freq: Four times a day (QID) | ORAL | 0 refills | Status: DC | PRN

## 2017-02-03 MED ORDER — ASPIRIN 81 MG PO CHEW: 81.0000 mg | CHEWABLE_TABLET | Freq: Two times a day (BID) | ORAL | 0 refills | Status: DC

## 2017-02-03 NOTE — Progress Notes (Signed)
Patient ID: [name], female   DOB: [birth year], 71 y.o.   MRN: [id number] No acute changes.  Right hip stable.  Vitals stable.  Can be discharged today to skilled nursing.

## 2017-02-03 NOTE — Progress Notes (Signed)
Patient discharged to [facility], patient forgot she had patient supplied medication topiramate in pharmacy. Patients daughter [name] called, patient gave permission for her to pick it up from me. Reviewed and signed pick-up form with daughter [name] for topiramate and returned to daughter.

## 2017-02-03 NOTE — Progress Notes (Signed)
Attempted to call report, Shelia the Receptionist stated she could not reach the Nurse that will taking over care for patient at [facility]. Will attempt again before patient leaves.

## 2017-02-03 NOTE — Discharge Summary (Signed)
Patient ID: [name] MRN: [id number] DOB/AGE: 1945/11/24 71 y.o.  Admit date: 01/30/2017 Discharge date: 02/03/2017  Admission Diagnoses:  Principal Problem:   Avascular necrosis of bone of hip, right (HCC) Active Problems:   Status post total replacement of right hip   Discharge Diagnoses:  Same  Past Medical History:  Diagnosis Date  . Depression   . Diabetes mellitus without complication (HCC)   . Glaucoma   . Headache    Vestibular migraines  . History of bronchitis   . History of pneumonia   . RA (rheumatoid arthritis) (HCC)   . TIA (transient ischemic attack)   . Vertigo   . Wears glasses     Surgeries: Procedure(s): RIGHT TOTAL HIP ARTHROPLASTY ANTERIOR APPROACH on 01/30/2017   Consultants:   Discharged Condition: Improved  Hospital Course: [name] is an 71 y.o. female who was admitted 01/30/2017 for operative treatment ofAvascular necrosis of bone of hip, right (HCC). Patient has severe unremitting pain that affects sleep, daily activities, and work/hobbies. After pre-op clearance the patient was taken to the operating room on 01/30/2017 and underwent  Procedure(s): RIGHT TOTAL HIP ARTHROPLASTY ANTERIOR APPROACH.    Patient was given perioperative antibiotics:  Anti-infectives (From admission, onward)   Start     Dose/Rate Route Frequency Ordered Stop   01/30/17 1800  clindamycin (CLEOCIN) IVPB 600 mg     600 mg 100 mL/hr over 30 Minutes Intravenous Every 6 hours 01/30/17 1421 01/31/17 0242   01/30/17 0940  clindamycin (CLEOCIN) 900 MG/50ML IVPB    Comments:  Curlene Dolphin   : cabinet override      01/30/17 0940 01/30/17 1128   01/30/17 0930  clindamycin (CLEOCIN) IVPB 900 mg     900 mg 100 mL/hr over 30 Minutes Intravenous On call to O.R. 01/30/17 0930 01/30/17 1148       Patient was given sequential compression devices, early ambulation, and chemoprophylaxis to prevent DVT.  Patient benefited maximally from hospital  stay and there were no complications.    Recent vital signs:  Patient Vitals for the past 24 hrs:  BP Temp Temp src Pulse Resp SpO2  02/03/17 0620 (!) 123/58 98.7 F (37.1 C) Oral 65 16 99 %  02/02/17 2224 (!) 104/47 98.3 F (36.8 C) Oral 62 17 98 %  02/02/17 1409 119/64 99 F (37.2 C) Oral 71 16 95 %     Recent laboratory studies:  Recent Labs    02/01/17 0625  WBC 11.5*  HGB 10.7*  HCT 33.8*  PLT 251     Discharge Medications:   Allergies as of 02/03/2017      Reactions   Penicillins Itching, Swelling   Has patient had a PCN reaction causing immediate rash, facial/tongue/throat swelling, SOB or lightheadedness with hypotension: Yes Has patient had a PCN reaction causing severe rash involving mucus membranes or skin necrosis: No Has patient had a PCN reaction that required hospitalization No Has patient had a PCN reaction occurring within the last 10 years: No If all of the above answers are "NO", then may proceed with Cephalosporin use.      Medication List    STOP taking these medications   HYDROcodone-acetaminophen 5-325 MG tablet Commonly known as:  NORCO/VICODIN     TAKE these medications   aspirin 81 MG chewable tablet Chew 1 tablet (81 mg total) by mouth 2 (two) times daily.   cetirizine 10 MG tablet Commonly known as:  ZYRTEC Take 10 mg by mouth daily.  diclofenac 75 MG EC tablet Commonly known as:  VOLTAREN TAKE 1 TABLET BY MOUTH ONCE A DAILY IN THE MORNING.   EPIPEN 2-PAK 0.3 mg/0.3 mL Soaj injection Generic drug:  EPINEPHrine Inject 0.3 mg into the muscle daily as needed. For anaphylactic allergic reaction   ezetimibe 10 MG tablet Commonly known as:  ZETIA Take 10 mg by mouth daily.   fenofibrate 54 MG tablet Take 54 mg by mouth daily.   furosemide 20 MG tablet Commonly known as:  LASIX Take 30 mg by mouth daily.   hydrOXYzine 25 MG tablet Commonly known as:  ATARAX/VISTARIL Take 25 mg by mouth daily.   methocarbamol 500 MG  tablet Commonly known as:  ROBAXIN Take 1 tablet (500 mg total) by mouth every 6 (six) hours as needed for muscle spasms.   methotrexate 50 MG/2ML injection Inject 20 mg into the vein every Monday. 0.8 ml   NON FORMULARY Inject 1 Syringe into the skin once a week. Allergy Shots received once a week.   oxyCODONE-acetaminophen 5-325 MG tablet Commonly known as:  ROXICET Take 1-2 tablets by mouth every 4 (four) hours as needed.   pioglitazone 30 MG tablet Commonly known as:  ACTOS Take 30 mg by mouth daily.   SIMPONI ARIA IV Inject 1 Dose into the vein every 8 (eight) weeks.   SUMAtriptan 100 MG tablet Commonly known as:  IMITREX Take 1 tablet (100 mg total) by mouth once as needed. May repeat in 2 hours if headache persists or recurs.   Topiramate ER 50 MG Cs24 sprinkle capsule Commonly known as:  QUDEXY XR Take 50 mg by mouth at bedtime. May increase to 100 mg by mouth at bedtime as tolerated. What changed:  additional instructions   Vitamin D (Ergocalciferol) 50000 units Caps capsule Commonly known as:  DRISDOL Take 50,000 Units by mouth every Monday.            Durable Medical Equipment  (From admission, onward)        Start     Ordered   01/30/17 1422  DME 3 n 1  Once     01/30/17 1421   01/30/17 1422  DME Walker rolling  Once    Question:  Patient needs a walker to treat with the following condition  Answer:  Status post total replacement of right hip   01/30/17 1421      Diagnostic Studies: Dg Pelvis Portable  Result Date: 01/30/2017 CLINICAL DATA:  Total-hip replacement EXAM: PORTABLE PELVIS 1-2 VIEWS COMPARISON:  Intraoperative images January 30, 2017 FINDINGS: Frontal pelvis obtained. There is a total hip replacement on the right with prosthetic components appearing well-seated. No acute fracture dislocation. Soft tissue air on the right is consistent with recent surgery. There is slight narrowing of the left hip joint. There is postoperative change in  the lower lumbar and upper sacral regions. IMPRESSION: Total hip replacement on the right with prosthetic components appearing well seated on single view. No acute fracture or dislocation. Mild narrowing left hip joint. Postoperative change lower lumbar and upper sacral regions. Electronically Signed   By: Bretta Bang III M.D.   On: 01/30/2017 13:39   Dg C-arm 1-60 Min-no Report  Result Date: 01/30/2017 CLINICAL DATA:  Intraoperative anterior approach right total hip joint replacement. EXAM: OPERATIVE right HIP (WITH PELVIS IF PERFORMED) 2 VIEWS TECHNIQUE: Fluoroscopic spot image(s) were submitted for interpretation post-operatively. COMPARISON:  Preoperative study of October 11, 2016 FINDINGS: Reported fluoro time is 27 seconds. 3.62 mGy dose.  The patient has undergone right total hip joint prosthesis placement. Radiographic positioning of the prosthetic components is good. The interface with the native bone appears normal. IMPRESSION: No immediate postprocedure complication following right hip joint prosthesis placement. Electronically Signed   By: David  [place] M.D.   On: 01/30/2017 12:50   Dg Hip Operative Unilat With Pelvis Right  Result Date: 01/30/2017 CLINICAL DATA:  Intraoperative anterior approach right total hip joint replacement. EXAM: OPERATIVE right HIP (WITH PELVIS IF PERFORMED) 2 VIEWS TECHNIQUE: Fluoroscopic spot image(s) were submitted for interpretation post-operatively. COMPARISON:  Preoperative study of October 11, 2016 FINDINGS: Reported fluoro time is 27 seconds. 3.62 mGy dose. The patient has undergone right total hip joint prosthesis placement. Radiographic positioning of the prosthetic components is good. The interface with the native bone appears normal. IMPRESSION: No immediate postprocedure complication following right hip joint prosthesis placement. Electronically Signed   By: David  [place] M.D.   On: 01/30/2017 12:50    Disposition: to skilled nursing  facility  Discharge Instructions    Discharge patient   Complete by:  As directed    Discharge disposition:  03-Skilled Nursing Facility   Discharge patient date:  02/03/2017      Follow-up Information    [facility] Follow up.   Specialty:  Home Health Services Why:  physical therapy Contact information: [address] [place] [address] [place] [address] [phone]        Kathryne Hitch, MD. Schedule an appointment as soon as possible for a visit in 2 week(s).   Specialty:  Orthopedic Surgery Contact information: [address] [place] [address] [phone]            Signed: Kathryne Hitch 02/03/2017, 7:16 AM

## 2017-02-03 NOTE — Progress Notes (Signed)
Physical Therapy Treatment Patient Details Name: [name] MRN: [id number] DOB: 19-Apr-1945 Today's Date: 02/03/2017    History of Present Illness 71 yo f s/p Right DATHA, H/O back surgery    PT Comments    POD # 4 pm session Assisted OOB to amb to bathroom then in hallway.  Slow but steady gait.  Pt plans to D/C to [facility] today   Follow Up Recommendations  SNF     Equipment Recommendations  None recommended by PT    Recommendations for Other Services       Precautions / Restrictions Precautions Precautions: Fall Restrictions Weight Bearing Restrictions: No Other Position/Activity Restrictions: WBAT    Mobility  Bed Mobility Overal bed mobility: Needs Assistance Bed Mobility: Supine to Sit     Supine to sit: Mod assist;Min assist     General bed mobility comments: assist R LE and increased time   Transfers Overall transfer level: Needs assistance Equipment used: Rolling walker (2 wheeled) Transfers: Sit to/from [employer] Sit to Stand: Min assist Stand pivot transfers: Min assist       General transfer comment: increased time with VCs safety with turns and hand placement with stand to sit  Ambulation/Gait Ambulation/Gait assistance: Min assist;Min guard Ambulation Distance (Feet): 47 Feet Assistive device: Rolling walker (2 wheeled) Gait Pattern/deviations: Step-to pattern;Step-through pattern Gait velocity: decreased   General Gait Details: slow but steady gait with VC's on safety with turns and proper walker to self distance   Stairs            Wheelchair Mobility    Modified Rankin (Stroke Patients Only)       Balance                                            Cognition Arousal/Alertness: Awake/alert Behavior During Therapy: WFL for tasks assessed/performed Overall Cognitive Status: Within Functional Limits for tasks assessed                                         Exercises      General Comments        Pertinent Vitals/Pain Pain Assessment: 0-10 Pain Score: 6  Pain Location: R hip Pain Descriptors / Indicators: Burning;Operative site guarding Pain Intervention(s): Monitored during session;Repositioned;Patient requesting pain meds-RN notified;Ice applied    Home Living                      Prior Function            PT Goals (current goals can now be found in the care plan section) Progress towards PT goals: Progressing toward goals    Frequency    7X/week      PT Plan Current plan remains appropriate    Co-evaluation              AM-PAC PT "6 Clicks" Daily Activity  Outcome Measure  Difficulty turning over in bed (including adjusting bedclothes, sheets and blankets)?: Unable Difficulty moving from lying on back to sitting on the side of the bed? : Unable Difficulty sitting down on and standing up from a chair with arms (e.g., wheelchair, bedside commode, etc,.)?: Unable Help needed moving to and from a bed to chair (including a wheelchair)?: A Little Help needed  walking in hospital room?: A Little Help needed climbing 3-5 steps with a railing? : Total 6 Click Score: 10    End of Session Equipment Utilized During Treatment: Gait belt Activity Tolerance: Patient tolerated treatment well Patient left: in chair;with call bell/phone within reach Nurse Communication: Mobility status PT Visit Diagnosis: Difficulty in walking, not elsewhere classified (R26.2);Pain Pain - Right/Left: Right Pain - part of body: Hip     Time: 1440-1505 PT Time Calculation (min) (ACUTE ONLY): 25 min  Charges:  $Gait Training: 8-22 mins $Therapeutic Activity: 8-22 mins                    G Codes:       {Particia Strahm  PTA [facility]  Acute  Rehab Pager      [phone]

## 2017-02-03 NOTE — Discharge Instructions (Signed)

## 2017-02-03 NOTE — Progress Notes (Signed)
Plan now for d/c to SNF, discharge planning per CSW. [phone]

## 2017-02-07 ENCOUNTER — Telehealth (INDEPENDENT_AMBULATORY_CARE_PROVIDER_SITE_OTHER): Payer: Self-pay | Admitting: Orthopaedic Surgery

## 2017-02-07 ENCOUNTER — Telehealth: Payer: Self-pay

## 2017-02-07 NOTE — Telephone Encounter (Signed)
Patient called asked if she can take a shower. The number to contact patient is [phone]

## 2017-02-07 NOTE — Telephone Encounter (Signed)
Request Reference Number: [id number]. TOPIRAMATE CAP ER 50MG  is approved through 02/03/2018. For further questions, call [phone].

## 2017-02-07 NOTE — Telephone Encounter (Signed)
I received a prior auth request for topiramate. I have completed and submitted the PA and should have a determination within 48-72 hours.  (Key: [id number]) - [id number]

## 2017-02-07 NOTE — Telephone Encounter (Signed)
LMOM for patient that she may take a shower

## 2017-02-10 ENCOUNTER — Telehealth (INDEPENDENT_AMBULATORY_CARE_PROVIDER_SITE_OTHER): Payer: Self-pay | Admitting: Orthopaedic Surgery

## 2017-02-10 NOTE — Telephone Encounter (Signed)
Daughter and patient aware of the below message

## 2017-02-10 NOTE — Telephone Encounter (Signed)
Please advise 

## 2017-02-10 NOTE — Telephone Encounter (Signed)
Patient called stating that she is out of the rehab facility, but her legs/feet and ankles are swollen.  She called her Rheumatologist which informed her that she could start taking her Methotrexate again.  Patient wanted to know if we have any other suggestions.  CB#[phone] (Daughter's number) or [phone].  Thank you.

## 2017-02-10 NOTE — Telephone Encounter (Signed)
She needs to try TED hose on both legs

## 2017-02-12 ENCOUNTER — Telehealth (INDEPENDENT_AMBULATORY_CARE_PROVIDER_SITE_OTHER): Payer: Self-pay | Admitting: Orthopaedic Surgery

## 2017-02-12 NOTE — Telephone Encounter (Signed)
Verbal order given on VM 

## 2017-02-12 NOTE — Telephone Encounter (Signed)
[name] from [facility] called requesting VO for the following:  2x a week for 1 week 1x a week for 2 weeks ADL transfers IADL exercise  DC when goals met or max potential.  CB#[phone].  Thank you.

## 2017-02-13 ENCOUNTER — Inpatient Hospital Stay (INDEPENDENT_AMBULATORY_CARE_PROVIDER_SITE_OTHER): Payer: Medicare Other | Admitting: Physician Assistant

## 2017-02-17 ENCOUNTER — Inpatient Hospital Stay (INDEPENDENT_AMBULATORY_CARE_PROVIDER_SITE_OTHER): Payer: Medicare Other | Admitting: Physician Assistant

## 2017-02-17 ENCOUNTER — Encounter (INDEPENDENT_AMBULATORY_CARE_PROVIDER_SITE_OTHER): Payer: Self-pay | Admitting: Orthopaedic Surgery

## 2017-02-17 ENCOUNTER — Inpatient Hospital Stay (INDEPENDENT_AMBULATORY_CARE_PROVIDER_SITE_OTHER): Payer: Medicare Other | Admitting: Orthopaedic Surgery

## 2017-02-17 ENCOUNTER — Ambulatory Visit (INDEPENDENT_AMBULATORY_CARE_PROVIDER_SITE_OTHER): Payer: Medicare Other | Admitting: Orthopaedic Surgery

## 2017-02-17 DIAGNOSIS — Z96641 Presence of right artificial hip joint: Secondary | ICD-10-CM

## 2017-02-17 NOTE — Progress Notes (Signed)
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  She is ambulate with a walker and making daily progress.  Last week she had severe swelling in her feet but is starting to dissipate some.  She does take hydrocodone for pain.  She is actually ready to drive soon she states as well as get back to her part-time work.  She does see a regular rheumatologist.  She is questioning when she can have infusions again.  I told her I be happy with her scheduling rheumatoid infusion with a rheumatologist in February.  On examination her ligaments are equal.  Her incisions well-healed scar with staples in her right hip and placed Steri-Strips.  She tolerates me putting her through range of motion.  I let her know that I do feel her pain is appropriate to be having pain still and the numbness and tingling is appropriate but definitely surgical site pain for the traumatic surgery put her through is normal.  All questions concerns were answered and addressed.  I will see her back in 4 weeks see how she is doing overall but no x-rays are needed.

## 2017-02-18 ENCOUNTER — Telehealth (INDEPENDENT_AMBULATORY_CARE_PROVIDER_SITE_OTHER): Payer: Self-pay | Admitting: Orthopaedic Surgery

## 2017-02-18 NOTE — Telephone Encounter (Signed)
Error

## 2017-02-18 NOTE — Telephone Encounter (Signed)
[facility] called letting Dr. Magnus Ivan know that they are discharging patient from therapy next week.

## 2017-02-18 NOTE — Telephone Encounter (Signed)
FYI

## 2017-03-05 ENCOUNTER — Telehealth (INDEPENDENT_AMBULATORY_CARE_PROVIDER_SITE_OTHER): Payer: Self-pay | Admitting: Orthopaedic Surgery

## 2017-03-05 NOTE — Telephone Encounter (Signed)
I think she can have an infusion in 2 weeks

## 2017-03-05 NOTE — Telephone Encounter (Signed)
Please advise 

## 2017-03-05 NOTE — Telephone Encounter (Signed)
Patient aware of the below message  

## 2017-03-05 NOTE — Telephone Encounter (Signed)
Patient states they have an appt today at 1:15 with their RA doctor and she would like to know how many weeks Dr. Magnus Ivan said before she is able to have another infusion with them so she can let them know at her appt. Please advise # [phone]

## 2017-03-06 ENCOUNTER — Other Ambulatory Visit: Payer: Self-pay | Admitting: Neurology

## 2017-03-11 ENCOUNTER — Telehealth: Payer: Self-pay | Admitting: Neurology

## 2017-03-11 NOTE — Telephone Encounter (Signed)
Patient requesting refill of Topiramate ER (QUDEXY XR) 50 MG CS24 sprinkle capsule resent [facility]

## 2017-03-11 NOTE — Telephone Encounter (Addendum)
Spoke with patient. She reported a constant dull headache. Nose bleeds have happened a few times. Yesterday her BP was 130/66 which she stated is high for her. She stated she has also had ear pain associated with vertigo. She has had one episode of vertigo last week. She is going to increase her Qudexy to 100 mg at night as per already discussed with Dr. Lucia Gaskins. I advised patient to call her PCP regarding nose bleeds and if it bleeds again and does not stop reasonably or is a lot of blood, go to ED. She verbalized understanding. I also informed pt I would d/w work-in MD or possibly Dr. Lucia Gaskins as she is out of the office.

## 2017-03-11 NOTE — Telephone Encounter (Signed)
Pt called pharmacy did not rec' the RX. Pt also said she does not want Topiramate ER (QUDEXY XR) 50 MG CS24 sprinkle capsule, she is wanting tabs or capsules. The pt had a call while talking with me, she thought it to be RN from the our clinic and took that call.

## 2017-03-11 NOTE — Telephone Encounter (Signed)
Patient complaining of migraines for the last 2 weeks, vertigo and frequent nose bleeds. Does she need an appointment?  She does not know what she needs to do.

## 2017-03-11 NOTE — Telephone Encounter (Signed)
Spoke with Dr. Epimenio Foot, work-in MD. He is aware of the patient's report of symptoms. He agreed to have patient call PCP and discuss the nose bleeds as they are not related. Patient should increase Qudexy to 100 mg at bedtime and give it about two weeks. I called the patient and discussed this with her. Advised to call us in the meantime. She stated she was already speaking with her PCP and she verbalized understanding & appreciation.

## 2017-03-11 NOTE — Telephone Encounter (Signed)
Called pharmacy and spoke with technician. I confirmed that they have the order for Qudexy XR 50 mg capsules and will have to order it, expect it within 1-2 days.

## 2017-03-14 DIAGNOSIS — R040 Epistaxis: Secondary | ICD-10-CM | POA: Insufficient documentation

## 2017-03-17 ENCOUNTER — Other Ambulatory Visit (INDEPENDENT_AMBULATORY_CARE_PROVIDER_SITE_OTHER): Payer: Self-pay

## 2017-03-17 ENCOUNTER — Ambulatory Visit (INDEPENDENT_AMBULATORY_CARE_PROVIDER_SITE_OTHER): Payer: Medicare Other | Admitting: Orthopaedic Surgery

## 2017-03-17 DIAGNOSIS — M79604 Pain in right leg: Secondary | ICD-10-CM

## 2017-03-17 MED ORDER — METHYLPREDNISOLONE 4 MG PO TABS: ORAL_TABLET | ORAL | 0 refills | Status: DC

## 2017-03-17 MED ORDER — NABUMETONE 500 MG PO TABS: 500.0000 mg | ORAL_TABLET | Freq: Two times a day (BID) | ORAL | 1 refills | Status: DC | PRN

## 2017-03-17 NOTE — Progress Notes (Signed)
Patient is here today 6 weeks status post a right total hip arthroplasty.  She somewhat has severe rheumatoid disease is been having a lot of pain in the right foot.  She said her rheumatologist said she may have nerve damage in her foot.  She is on Neurontin 300 mg 3 times a day.  On exam she has full motor function in her right foot does not really a sensory deficit but is more global pain and just tenderness touching her skin.  She does have calf pain as well.  She is able to flex and extend her foot.  She is obviously uncomfortable from this.  Her right hip exam postoperatively though is normal in terms of me able to move around her hip easily with minimal discomfort.  I will extend her for an ultrasound Doppler of the right lower extremity to rule out DVT.  We did have her on 81 mg aspirin twice a day after surgery and she is back to her regular dose.  I will send in a 6-day steroid taper in light of her being a diabetic who has had these before.  We will also try some Relafen as an anti-inflammatory and I like to see her back in just 2 weeks to see how she is doing overall.  If she does have a blood clot we will treat that accordingly.  When I see her in 2 weeks if she still having severe ankle pain I would like at least a mortise and lateral of her right ankle.

## 2017-03-18 ENCOUNTER — Other Ambulatory Visit (INDEPENDENT_AMBULATORY_CARE_PROVIDER_SITE_OTHER): Payer: Self-pay

## 2017-03-18 ENCOUNTER — Telehealth (INDEPENDENT_AMBULATORY_CARE_PROVIDER_SITE_OTHER): Payer: Self-pay | Admitting: Orthopaedic Surgery

## 2017-03-18 ENCOUNTER — Telehealth (INDEPENDENT_AMBULATORY_CARE_PROVIDER_SITE_OTHER): Payer: Self-pay

## 2017-03-18 ENCOUNTER — Ambulatory Visit (HOSPITAL_COMMUNITY)
Admission: RE | Admit: 2017-03-18 | Discharge: 2017-03-18 | Disposition: A | Discharge status: Home / Self Care | Payer: Medicare Other | Source: Ambulatory Visit | Attending: Orthopaedic Surgery | Admitting: Orthopaedic Surgery

## 2017-03-18 DIAGNOSIS — I82431 Acute embolism and thrombosis of right popliteal vein: Secondary | ICD-10-CM | POA: Diagnosis not present

## 2017-03-18 DIAGNOSIS — I82411 Acute embolism and thrombosis of right femoral vein: Secondary | ICD-10-CM | POA: Diagnosis not present

## 2017-03-18 DIAGNOSIS — M79604 Pain in right leg: Secondary | ICD-10-CM

## 2017-03-18 MED ORDER — RIVAROXABAN (XARELTO) VTE STARTER PACK (15 & 20 MG): ORAL_TABLET | ORAL | 0 refills | Status: DC

## 2017-03-18 NOTE — Telephone Encounter (Signed)
Patient called stating that she had a scan today and they found a blood clot in her leg which they thought was from nerve damage.  Her doctor is putting her on a blood thinner.  Patient wanted to know what she should do about the Gabapentin when she starts taking the blood thinner.  CB#[phone].  Thank you.

## 2017-03-18 NOTE — Telephone Encounter (Signed)
That's not a problem is it?

## 2017-03-19 NOTE — Telephone Encounter (Signed)
She can still take the gabapentin without issue.

## 2017-03-19 NOTE — Telephone Encounter (Signed)
Patient aware of the below message  

## 2017-03-25 NOTE — Telephone Encounter (Signed)
error 

## 2017-03-26 ENCOUNTER — Encounter (HOSPITAL_BASED_OUTPATIENT_CLINIC_OR_DEPARTMENT_OTHER): Payer: Self-pay | Admitting: Emergency Medicine

## 2017-03-26 ENCOUNTER — Telehealth (INDEPENDENT_AMBULATORY_CARE_PROVIDER_SITE_OTHER): Payer: Self-pay | Admitting: Orthopaedic Surgery

## 2017-03-26 ENCOUNTER — Other Ambulatory Visit: Payer: Self-pay

## 2017-03-26 ENCOUNTER — Emergency Department (HOSPITAL_BASED_OUTPATIENT_CLINIC_OR_DEPARTMENT_OTHER)
Admission: EM | Admit: 2017-03-26 | Discharge: 2017-03-26 | Disposition: A | Discharge status: Home / Self Care | Payer: Medicare Other | Attending: Emergency Medicine | Admitting: Emergency Medicine

## 2017-03-26 DIAGNOSIS — R040 Epistaxis: Secondary | ICD-10-CM

## 2017-03-26 DIAGNOSIS — Z7982 Long term (current) use of aspirin: Secondary | ICD-10-CM | POA: Diagnosis not present

## 2017-03-26 DIAGNOSIS — Z79899 Other long term (current) drug therapy: Secondary | ICD-10-CM | POA: Insufficient documentation

## 2017-03-26 DIAGNOSIS — Z7984 Long term (current) use of oral hypoglycemic drugs: Secondary | ICD-10-CM | POA: Insufficient documentation

## 2017-03-26 DIAGNOSIS — Z8673 Personal history of transient ischemic attack (TIA), and cerebral infarction without residual deficits: Secondary | ICD-10-CM | POA: Insufficient documentation

## 2017-03-26 DIAGNOSIS — E119 Type 2 diabetes mellitus without complications: Secondary | ICD-10-CM | POA: Diagnosis not present

## 2017-03-26 DIAGNOSIS — Z7901 Long term (current) use of anticoagulants: Secondary | ICD-10-CM | POA: Diagnosis not present

## 2017-03-26 DIAGNOSIS — Z96641 Presence of right artificial hip joint: Secondary | ICD-10-CM | POA: Insufficient documentation

## 2017-03-26 MED ORDER — HYDROCODONE-ACETAMINOPHEN 5-325 MG PO TABS: 1.0000 | ORAL_TABLET | Freq: Four times a day (QID) | ORAL | 0 refills | Status: DC | PRN

## 2017-03-26 MED ORDER — HYDROCODONE-ACETAMINOPHEN 5-325 MG PO TABS
1.0000 | ORAL_TABLET | Freq: Once | ORAL | Status: AC
  Administered 2017-03-26: 1 via ORAL
  Filled 2017-03-26: qty 1

## 2017-03-26 MED ORDER — CLINDAMYCIN HCL 300 MG PO CAPS: 300.0000 mg | ORAL_CAPSULE | Freq: Three times a day (TID) | ORAL | 0 refills | Status: AC

## 2017-03-26 MED FILL — CLINDAMYCIN HCL 300 MG CAPS: 300 | 5 days supply | Qty: 15 | Fill #0

## 2017-03-26 MED FILL — HYDROCODON-APAP 5-325: 5-325 | 6 days supply | Qty: 25 | Fill #0

## 2017-03-26 NOTE — Discharge Instructions (Addendum)
Eat a soft diet.  Do not blow your nose aggressively.  Use saline spray in your right nostril. If congestion is severe, use Afrin/decongestant but no more than 3 days.

## 2017-03-26 NOTE — Telephone Encounter (Signed)
See below

## 2017-03-26 NOTE — ED Triage Notes (Signed)
Pt with bleeding from right nare. Pt has hx of same and was seen on ENT 2/8 for same.

## 2017-03-26 NOTE — ED Notes (Signed)
ED Provider at bedside, Dr. Erma Heritage.

## 2017-03-26 NOTE — ED Notes (Signed)
ED Provider at bedside.  Small amount of Air had been removed from [employer] for pt comfort but it started bleeding again around it so edp put more air in again.

## 2017-03-26 NOTE — Telephone Encounter (Signed)
Pt daughter called stating pt had to go to ED last night around 4am d/t nose bleed, says pt has been having a lot of nose bleeds since she was put on blood thinner for DVT, daughter is wanting to know if pt can be seen sooner then Tuesday to check her ankle/foot to see about the blood clot and to try to get off blood thinner. Pt is seeing ENT on Monday as well. I looked to see if I can work her in on Monday as she requested but no avail. Please advise.  Call back # [phone]

## 2017-03-26 NOTE — ED Provider Notes (Signed)
Assumed care from Dr. Blinda Leatherwood at 7:28 AM. Briefly, the patient is a 72 y.o. female with PMHx of  has a past medical history of Depression, Diabetes mellitus without complication (HCC), Glaucoma, Headache, History of bronchitis, History of pneumonia, RA (rheumatoid arthritis) (HCC), TIA (transient ischemic attack), Vertigo, and Wears glasses. here with epistaxis. 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 Anterior nasal pack in place. Awaiting response to therapy  Labs Reviewed - No data to display  Course of Care: Pt with no further bleeding >30 min after packing adjustment. Will d/c with outpt ENT follow-up. Pt will call Dr. Jearld Fenton today when office opens in 1 hour.  Of note, patient initially not given antibiotics.  However, she is a rheumatoid arthritis patient on Remicade.  Will give her a short course of clindamycin given penicillin allergy, in the setting of immune suppression with nasal packing in place.  Clinical Impression: 1. Left-sided epistaxis     Disposition: Discharge  Condition: Good  I have discussed the results, Dx and Tx plan with the pt(& family if present). He/she/they expressed understanding and agree(s) with the plan. Discharge instructions discussed at great length. Strict return precautions discussed and pt &/or family have verbalized understanding of the instructions. No further questions at time of discharge.    New Prescriptions   HYDROCODONE-ACETAMINOPHEN (NORCO/VICODIN) 5-325 MG TABLET    Take 1 tablet by mouth every 6 (six) hours as needed for moderate pain.    Follow Up: Suzanna Obey, MD [address] [place] [address] [phone]           Shaune Pollack, MD 03/26/17 [phone]

## 2017-03-26 NOTE — ED Notes (Signed)
ED Provider at bedside. 

## 2017-03-26 NOTE — ED Provider Notes (Signed)
[facility] EMERGENCY DEPARTMENT Provider Note   CSN: [id number] Arrival date & time: 03/26/17  0531     History   Chief Complaint Chief Complaint  Patient presents with  . Epistaxis    HPI [name] is a 72 y.o. female.  Patient presents to the ER for evaluation of nosebleed.  Patient reports that she has been having intermittent nosebleeds for several weeks.  She was seen by ENT for this, but did not have any direct interventions.  Since that time, however, she has been diagnosed with a DVT in her leg and started Xarelto.  Tonight she got up to go to the bathroom and started having significant amount of bleeding from the left side of her nose.  She has not been able to stop this with direct pressure.  Bleeding is more substantial than it has been previously.      Past Medical History:  Diagnosis Date  . Depression   . Diabetes mellitus without complication (HCC)   . Glaucoma   . Headache    Vestibular migraines  . History of bronchitis   . History of pneumonia   . RA (rheumatoid arthritis) (HCC)   . TIA (transient ischemic attack)   . Vertigo   . Wears glasses     Patient Active Problem List   Diagnosis Date Noted  . 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 Avascular necrosis of bone of hip, right (HCC) 12/18/2016  . Trochanteric bursitis, right hip 11/04/2016  . Pain of right hip joint 11/04/2016  . S/P lumbar spinal fusion 07/21/2015    Past Surgical History:  Procedure Laterality Date  . APPENDECTOMY    . BACK SURGERY  2012, 2017  . BILATERAL CARPAL TUNNEL RELEASE    . CESAREAN SECTION     x3  . GLAUCOMA SURGERY    . KNEE ARTHROSCOPY Left   . MOUTH SURGERY    . TONSILLECTOMY    . TOTAL HIP ARTHROPLASTY Right 01/30/2017   Procedure: RIGHT TOTAL HIP ARTHROPLASTY ANTERIOR APPROACH;  Surgeon: Kathryne Hitch, MD;  Location: [facility];  Service: Orthopedics;  Laterality: Right;    OB History    No data  available       Home Medications    Prior to Admission medications   Medication Sig Start Date End Date Taking? Authorizing Provider  aspirin 81 MG chewable tablet Chew 1 tablet (81 mg total) by mouth 2 (two) times daily. 02/03/17   Kathryne Hitch, MD  cetirizine (ZYRTEC) 10 MG tablet Take 10 mg by mouth daily.     [provider]  diclofenac (VOLTAREN) 75 MG EC tablet TAKE 1 TABLET BY MOUTH ONCE A DAILY IN THE MORNING. 03/19/16   [provider]  EPIPEN 2-PAK 0.3 MG/0.3ML SOAJ injection Inject 0.3 mg into the muscle daily as needed. For anaphylactic allergic reaction 11/21/16   [provider]  ezetimibe (ZETIA) 10 MG tablet Take 10 mg by mouth daily.    [provider]  fenofibrate 54 MG tablet Take 54 mg by mouth daily.    [provider]  furosemide (LASIX) 20 MG tablet Take 30 mg by mouth daily.    [provider]  Golimumab (SIMPONI ARIA IV) Inject 1 Dose into the vein every 8 (eight) weeks.     [provider]  hydrOXYzine (ATARAX/VISTARIL) 25 MG tablet Take 25 mg by mouth daily. 11/08/16   [provider]  methocarbamol (ROBAXIN) 500 MG tablet  Take 1 tablet (500 mg total) by mouth every 6 (six) hours as needed for muscle spasms. 02/03/17   Kathryne Hitch, MD  methotrexate 50 MG/2ML injection Inject 20 mg into the vein every Monday. 0.8 ml    [provider]  methylPREDNISolone (MEDROL) 4 MG tablet Medrol dose pack. Take as instructed 03/17/17   Kathryne Hitch, MD  nabumetone (RELAFEN) 500 MG tablet Take 1 tablet (500 mg total) by mouth 2 (two) times daily as needed. 03/17/17   Kathryne Hitch, MD  NON FORMULARY Inject 1 Syringe into the skin once a week. Allergy Shots received once a week.    [provider]  oxyCODONE-acetaminophen (ROXICET) 5-325 MG tablet Take 1-2 tablets by mouth every 4 (four) hours as needed. 02/03/17   Kathryne Hitch, MD    pioglitazone (ACTOS) 30 MG tablet Take 30 mg by mouth daily.    [provider]  Rivaroxaban 15 & 20 MG TBPK Take as directed on package: Start with one 15mg  tablet by mouth twice a day with food. On Day 22, switch to one 20mg  tablet once a day with food. 03/18/17   , MD  SUMAtriptan (IMITREX) 100 MG tablet Take 1 tablet (100 mg total) by mouth once as needed. May repeat in 2 hours if headache persists or recurs. 04/26/16   Kathryne Hitch, MD  Topiramate ER (QUDEXY XR) 50 MG CS24 sprinkle capsule TAKE 50 MG BY MOUTH AT BEDTIME. MAY INCREASE TO 100 MG BY MOUTH AT BEDTIME AS TOLERATED. 03/10/17   Anson Fret, MD  Vitamin D, Ergocalciferol, (DRISDOL) 50000 units CAPS capsule Take 50,000 Units by mouth every Monday. 12/11/16   [provider]    Family History Family History  Problem Relation Age of Onset  . Diabetes Mother   . Stroke Mother   . High blood pressure Father   . Stroke Father   . Diabetes Father     Social History Social History   Tobacco Use  . Smoking status: Never Smoker  . Smokeless tobacco: Never Used  Substance Use Topics  . Alcohol use: No  . Drug use: No     Allergies   Penicillins   Review of Systems Review of Systems  HENT: Positive for nosebleeds.   All other systems reviewed and are negative.    Physical Exam Updated Vital Signs BP (!) 155/76 (BP Location: Right Arm)   Pulse 96   Temp 98.2 F (36.8 C) (Oral)   Resp 18   Wt 87.1 kg (192 lb)   SpO2 100%   BMI 36.28 kg/m   Physical Exam  Constitutional: She is oriented to person, place, and time. She appears well-developed and well-nourished. No distress.  HENT:  Head: Normocephalic and atraumatic.  Right Ear: Hearing normal.  Left Ear: Hearing normal.  Nose: Epistaxis (Brisk bleeding from the left side of nose with large clots present) is observed.  Mouth/Throat: Oropharynx is clear and moist and mucous membranes are normal.  Eyes:  Conjunctivae and EOM are normal. Pupils are equal, round, and reactive to light.  Neck: Normal range of motion. Neck supple.  Cardiovascular: Regular rhythm, S1 normal and S2 normal. Exam reveals no gallop and no friction rub.  No murmur heard. Pulmonary/Chest: Effort normal and breath sounds normal. No respiratory distress. She exhibits no tenderness.  Abdominal: Soft. Normal appearance and bowel sounds are normal. There is no hepatosplenomegaly. There is no tenderness. There is no rebound, no guarding, no tenderness  at McBurney's point and negative Murphy's sign. No hernia.  Musculoskeletal: Normal range of motion.  Neurological: She is alert and oriented to person, place, and time. She has normal strength. No cranial nerve deficit or sensory deficit. Coordination normal. GCS eye subscore is 4. GCS verbal subscore is 5. GCS motor subscore is 6.  Skin: Skin is warm, dry and intact. No rash noted. No cyanosis.  Psychiatric: She has a normal mood and affect. Her speech is normal and behavior is normal. Thought content normal.  Nursing note and vitals reviewed.    ED Treatments / Results  Labs (all labs ordered are listed, but only abnormal results are displayed) Labs Reviewed - No data to display  EKG  EKG Interpretation None       Radiology No results found.  Procedures .Epistaxis Management Date/Time: 03/26/2017 6:12 AM Performed by: Gilda Crease, MD Authorized by: Gilda Crease, MD   Consent:    Consent obtained:  Verbal   Consent given by:  Patient   Risks discussed:  Pain   Alternatives discussed:  Alternative treatment Universal protocol:    Procedure explained and questions answered to patient or proxy's satisfaction: yes     Site/side marked: yes     Time out called: yes     Patient identity confirmed:  Verbally with patient Anesthesia (see MAR for exact dosages):    Anesthesia method:  None Procedure details:    Treatment site: Left side,  unable to tell if anterior or posterior.   Treatment method:  Nasal balloon   Treatment complexity:  Limited   Treatment episode: initial   Post-procedure details:    Assessment:  Bleeding stopped   Patient tolerance of procedure:  Tolerated with difficulty (Complains of pain secondary to balloon)   (including critical care time)  Medications Ordered in ED Medications - No data to display   Initial Impression / Assessment and Plan / ED Course  I have reviewed the triage vital signs and the nursing notes.  Pertinent labs & imaging results that were available during my care of the patient were reviewed by me and considered in my medical decision making (see chart for details).     Patient presents with left-sided epistaxis.  She has been having issues with ongoing left-sided nosebleed for several weeks.  She was seen by ENT and it was recommended that no intervention be performed.  Since then, however, she has been diagnosed with a DVT in her leg and has started Xarelto.  Bleeding was much heavier tonight than it had been previously.  Evaluation could not identify source of bleeding secondary to the briskness of the bleed.  I therefore recommended nasal packing.  Packing has been performed and although she is very uncomfortable with the pressure, bleeding has stopped.  She will be discharged to follow-up with ENT.  As per most recent literature guidelines, no antibiotics are indicated with nasal packing.  Final Clinical Impressions(s) / ED Diagnoses   Final diagnoses:  Left-sided epistaxis    ED Discharge Orders    None       Gilda Crease, MD 03/26/17 [phone]

## 2017-03-26 NOTE — Telephone Encounter (Signed)
She does need to be on blood thinners for the next 3-6 months due to the DVT.  Unfortunately DVT will not be resolved at all in this short amount of time.  The nosebleeds are likely related to the blood thinning medication.  She will likely need to be switched to a different blood thinning medication such as Eliquis or even Coumadin.  She needs to be seen by her primary care physician to help determine what may be the best direction to take.  She also may need to consider vascular surgery consultation for a [place] filter.  I am happy to see her in the office at any time but that we will not unfortunately change the plan for her needing to be on blood thinning medication due to the DVT.  We have never seen a resolution of a DVT this soon after being on a blood thinning medication.

## 2017-03-27 NOTE — Telephone Encounter (Signed)
LMOM for patient daughter of the below message

## 2017-04-01 ENCOUNTER — Ambulatory Visit (INDEPENDENT_AMBULATORY_CARE_PROVIDER_SITE_OTHER): Payer: Medicare Other | Admitting: Orthopaedic Surgery

## 2017-04-01 ENCOUNTER — Encounter (INDEPENDENT_AMBULATORY_CARE_PROVIDER_SITE_OTHER): Payer: Self-pay | Admitting: Orthopaedic Surgery

## 2017-04-01 DIAGNOSIS — Z96641 Presence of right artificial hip joint: Secondary | ICD-10-CM

## 2017-04-01 DIAGNOSIS — I82401 Acute embolism and thrombosis of unspecified deep veins of right lower extremity: Secondary | ICD-10-CM

## 2017-04-01 NOTE — Progress Notes (Signed)
The patient is now right a week status post a right total hip arthroplasty.  At her last visit we were concerned about a blood clot and she was sent for a Doppler ultrasound and it did reveal a DVT in her right lower extremity.  She is now on Xarelto 15 mg twice a day.  She has had significant nosebleeds since then.  Next week is when she goes to once a day dosing and that will be 20 mg once a day.  She is still been taking diclofenac and Relafen.  The diclofenac was through her rheumatologist and I placed her on Relafen.  She has not been off of these with being on a blood thinner.  On exam she is much more comfortable with me putting her right hip the range of motion and she has much less swelling in her right foot and ankle.  At this point she will stop all anti-inflammatories and that even includes over-the-counter Motrin.  I will see her back in 4 weeks to see how she is doing overall.  Next week she will start her once daily dosing of Xarelto at 20 mg daily and she will need to take this for at least a minimum of 3 months.

## 2017-04-07 ENCOUNTER — Telehealth (INDEPENDENT_AMBULATORY_CARE_PROVIDER_SITE_OTHER): Payer: Self-pay | Admitting: Orthopaedic Surgery

## 2017-04-07 NOTE — Telephone Encounter (Signed)
Patient called advised her right foot is hurting really bad. Patient advised she has two days of her medication left. The number to contact patient is [phone]

## 2017-04-07 NOTE — Telephone Encounter (Signed)
She should still go down to the Xarelto for just once a day and Dilaudid at times has no relation to the blood clot in terms of it dissipating or creating more knee pain.  This is usually the treatment dose of going down to once a day.  Her biggest concern was also her blood being too thin with all the nosebleeds that she had had and being on anti-inflammatories as well.  Again gave her reassurance that she is being on 1 Xarelto a day is what is needed.

## 2017-04-07 NOTE — Telephone Encounter (Signed)
2 days of which medication does she have left.  She obviously needs to be on her Xarelto given her recent DVT.  That medication needs to be daily once is been past 3 weeks of her taking it twice daily.  Find out what medication for pain that she needs from [place].

## 2017-04-07 NOTE — Telephone Encounter (Signed)
See below

## 2017-04-07 NOTE — Telephone Encounter (Signed)
She states she has two days left of the Xarelto twice a day dose She states she is worried to go to once a day when her foot hurts so bad

## 2017-04-08 MED ORDER — HYDROCODONE-ACETAMINOPHEN 5-325 MG PO TABS: 1.0000 | ORAL_TABLET | Freq: Four times a day (QID) | ORAL | 0 refills | Status: DC | PRN

## 2017-04-08 NOTE — Telephone Encounter (Signed)
I sent in some hydrocodone to her pharmacy

## 2017-04-08 NOTE — Telephone Encounter (Signed)
Patient states she doesn't have any pain medication

## 2017-04-08 NOTE — Telephone Encounter (Signed)
Patient aware this was called in for her  

## 2017-04-23 ENCOUNTER — Ambulatory Visit (INDEPENDENT_AMBULATORY_CARE_PROVIDER_SITE_OTHER): Payer: Medicare Other | Admitting: Physician Assistant

## 2017-04-29 ENCOUNTER — Ambulatory Visit (INDEPENDENT_AMBULATORY_CARE_PROVIDER_SITE_OTHER): Payer: Medicare Other | Admitting: Orthopaedic Surgery

## 2017-05-05 ENCOUNTER — Encounter (INDEPENDENT_AMBULATORY_CARE_PROVIDER_SITE_OTHER): Payer: Self-pay | Admitting: Orthopaedic Surgery

## 2017-05-05 ENCOUNTER — Ambulatory Visit (INDEPENDENT_AMBULATORY_CARE_PROVIDER_SITE_OTHER): Payer: Medicare Other | Admitting: Orthopaedic Surgery

## 2017-05-05 ENCOUNTER — Ambulatory Visit (INDEPENDENT_AMBULATORY_CARE_PROVIDER_SITE_OTHER): Payer: Medicare Other

## 2017-05-05 DIAGNOSIS — Z96641 Presence of right artificial hip joint: Secondary | ICD-10-CM | POA: Diagnosis not present

## 2017-05-05 DIAGNOSIS — M25571 Pain in right ankle and joints of right foot: Secondary | ICD-10-CM

## 2017-05-05 DIAGNOSIS — M7061 Trochanteric bursitis, right hip: Secondary | ICD-10-CM | POA: Diagnosis not present

## 2017-05-05 NOTE — Progress Notes (Signed)
The patient is continued to have severe debilitating right lower extremity pain from her hip all the way down her foot status post a right total hip arthroplasty done at the end of December.  She does have a known DVT and has been on Xarelto she is she cannot take anti-inflammatories.  On exam she hurts along the trochanteric area and IT band.  She still hurts globally around her right ankle.  X-ray of the ankle are obtained today and ankle is well located with no significant arthritic changes.  On only one view cannot see the potential for a nondisplaced healing lateral malleolus fracture but is very minimal and this may not be the case but certainly we can certainly treat it as the case which should be still nonoperative weightbearing as tolerated.  At this point I would like to send her to outpatient physical therapy to work on anything that can decrease her lower extremity pain and help with her mobility.  We will see her back in 4 weeks and I would like a low AP pelvis at that visit.  At that point we will then order a repeat DVT screen of the right lower extremity to see if her blood clot is resolving to the point that we can get her off of blood thinning medication to resume anti-inflammatories.

## 2017-06-04 ENCOUNTER — Ambulatory Visit (INDEPENDENT_AMBULATORY_CARE_PROVIDER_SITE_OTHER): Payer: Medicare Other | Admitting: Orthopaedic Surgery

## 2017-06-09 ENCOUNTER — Ambulatory Visit (INDEPENDENT_AMBULATORY_CARE_PROVIDER_SITE_OTHER): Payer: Medicare Other | Admitting: Orthopaedic Surgery

## 2017-06-09 ENCOUNTER — Ambulatory Visit (INDEPENDENT_AMBULATORY_CARE_PROVIDER_SITE_OTHER): Payer: Medicare Other

## 2017-06-09 ENCOUNTER — Other Ambulatory Visit (INDEPENDENT_AMBULATORY_CARE_PROVIDER_SITE_OTHER): Payer: Self-pay

## 2017-06-09 ENCOUNTER — Encounter (INDEPENDENT_AMBULATORY_CARE_PROVIDER_SITE_OTHER): Payer: Self-pay | Admitting: Orthopaedic Surgery

## 2017-06-09 DIAGNOSIS — M79604 Pain in right leg: Secondary | ICD-10-CM

## 2017-06-09 DIAGNOSIS — M79672 Pain in left foot: Principal | ICD-10-CM

## 2017-06-09 DIAGNOSIS — Z96641 Presence of right artificial hip joint: Secondary | ICD-10-CM | POA: Diagnosis not present

## 2017-06-09 DIAGNOSIS — M79605 Pain in left leg: Principal | ICD-10-CM

## 2017-06-09 DIAGNOSIS — M79671 Pain in right foot: Principal | ICD-10-CM

## 2017-06-09 MED ORDER — PREGABALIN 75 MG PO CAPS: 75.0000 mg | ORAL_CAPSULE | Freq: Two times a day (BID) | ORAL | 1 refills | Status: DC

## 2017-06-09 MED ORDER — METHOCARBAMOL 500 MG PO TABS: 500.0000 mg | ORAL_TABLET | Freq: Four times a day (QID) | ORAL | 0 refills | Status: DC | PRN

## 2017-06-09 NOTE — Progress Notes (Signed)
The patient is now 4 months into her right total hip arthroplasty.  She is still plagued by severe hip and leg pain.  She has bilateral calf pain and right ankle pain.  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  She reports severe calf pain on both her calves.  She still reports significant pain overall.  She is been on chronic hydrocodone for long period time.  Her rheumatologist is now referred her to chronic pain management.  She is not on any muscle relaxant right now and is not on a nerve pathway modulator.  We have tried Neurontin in the past.  On exam I can easily put her right hip the range of motion is very painful to her but there is no evidence of infection or instability of the hip at all.  Neither foot or ankle swollen significantly but she is very tender to just sensation in touch over both calves.  This point I do wish to have bilateral lower extremity Doppler ultrasounds but they do not need to be emergent.  I like to have them done this week so they can look at the veins on both her legs to assess her right DVT as well as make sure that nothing is happening on the left side.  I will try Lyrica at this point 75 mg twice a day as well as Robaxin as a muscle relaxant.  I will see her back in just a week.

## 2017-06-11 ENCOUNTER — Ambulatory Visit (HOSPITAL_COMMUNITY)
Admission: RE | Admit: 2017-06-11 | Discharge: 2017-06-11 | Disposition: A | Discharge status: Home / Self Care | Payer: Medicare Other | Source: Ambulatory Visit | Attending: Cardiology | Admitting: Cardiology

## 2017-06-11 DIAGNOSIS — M79605 Pain in left leg: Secondary | ICD-10-CM | POA: Insufficient documentation

## 2017-06-11 DIAGNOSIS — M79604 Pain in right leg: Secondary | ICD-10-CM | POA: Insufficient documentation

## 2017-06-11 DIAGNOSIS — M79672 Pain in left foot: Secondary | ICD-10-CM | POA: Diagnosis not present

## 2017-06-11 DIAGNOSIS — M79671 Pain in right foot: Secondary | ICD-10-CM | POA: Insufficient documentation

## 2017-06-13 ENCOUNTER — Encounter (HOSPITAL_COMMUNITY): Payer: Medicare Other

## 2017-06-16 ENCOUNTER — Ambulatory Visit (INDEPENDENT_AMBULATORY_CARE_PROVIDER_SITE_OTHER): Payer: Medicare Other | Admitting: Orthopaedic Surgery

## 2017-06-16 ENCOUNTER — Encounter (INDEPENDENT_AMBULATORY_CARE_PROVIDER_SITE_OTHER): Payer: Self-pay | Admitting: Orthopaedic Surgery

## 2017-06-16 DIAGNOSIS — M7061 Trochanteric bursitis, right hip: Secondary | ICD-10-CM

## 2017-06-16 DIAGNOSIS — Z96641 Presence of right artificial hip joint: Secondary | ICD-10-CM | POA: Diagnosis not present

## 2017-06-16 NOTE — Progress Notes (Signed)
The patient is here for follow-up after having repeat Dopplers to evaluate a DVT in her right leg.  We also got of her left calf.  Calf pain and tenderness.  She is been on Lyrica for the last week just taking at night because it was making her too tired during the daytime.  She is doing well overall except for continued leg pain.  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  The Doppler study showed no evidence of a DVT on her left side and her right side is completely resolved and showed no evidence of DVT in terms of the previous combo cyst that was there.  This point she can start anti-inflammatories in 2 days and she is stop her Xarelto today.  She does have a diclofenac at home and she will continue this starting Wednesday.  She also continue the Lyrica.  I like to see her back in 2 weeks to see how she is doing overall.

## 2017-06-21 ENCOUNTER — Other Ambulatory Visit: Payer: Self-pay | Admitting: Neurological Surgery

## 2017-06-21 DIAGNOSIS — G8929 Other chronic pain: Secondary | ICD-10-CM

## 2017-06-21 DIAGNOSIS — M546 Pain in thoracic spine: Principal | ICD-10-CM

## 2017-06-21 DIAGNOSIS — S32009K Unspecified fracture of unspecified lumbar vertebra, subsequent encounter for fracture with nonunion: Secondary | ICD-10-CM

## 2017-06-27 ENCOUNTER — Ambulatory Visit: Payer: Medicare Other | Admitting: Neurology

## 2017-07-01 ENCOUNTER — Encounter: Payer: Self-pay | Admitting: Neurology

## 2017-07-02 ENCOUNTER — Ambulatory Visit (INDEPENDENT_AMBULATORY_CARE_PROVIDER_SITE_OTHER): Payer: Medicare Other | Admitting: Orthopaedic Surgery

## 2017-07-02 ENCOUNTER — Encounter (INDEPENDENT_AMBULATORY_CARE_PROVIDER_SITE_OTHER): Payer: Self-pay | Admitting: Orthopaedic Surgery

## 2017-07-02 DIAGNOSIS — M7061 Trochanteric bursitis, right hip: Secondary | ICD-10-CM | POA: Diagnosis not present

## 2017-07-02 MED ORDER — LIDOCAINE HCL 1 % IJ SOLN
3.0000 mL | INTRAMUSCULAR | Status: AC | PRN
  Administered 2017-07-02: 3 mL

## 2017-07-02 MED ORDER — METHYLPREDNISOLONE ACETATE 40 MG/ML IJ SUSP
40.0000 mg | INTRAMUSCULAR | Status: AC | PRN
  Administered 2017-07-02: 40 mg via INTRA_ARTICULAR

## 2017-07-02 NOTE — Progress Notes (Signed)
Office Visit Note   Patient: [name]           Date of Birth: Oct 09, 1945           MRN: [id number] Visit Date: 07/02/2017              Requested by: Juluis Rainier, MD [address] [place], [place] [address] PCP: Juluis Rainier, MD   Assessment & Plan: Visit Diagnoses:  1. Trochanteric bursitis, right hip     Plan: We will have her follow-up with [place] in 3 months check her progress in regards to her right total hip arthroplasty and the trochanteric bursitis.  In regards to her lower leg pain and swelling she is on the Lyrica.  We will also send her to [place] and vascular to see if they have any suggestions to help with her lower leg swelling.  Questions encouraged and answered.  Follow-Up Instructions: No follow-ups on file.   Orders:  No orders of the defined types were placed in this encounter.  No orders of the defined types were placed in this encounter.     Procedures: Large Joint Inj: R greater trochanter on 07/02/2017 11:40 AM Indications: pain Details: 22 G 1.5 in needle, lateral approach  Arthrogram: No  Medications: 3 mL lidocaine 1 %; 40 mg methylPREDNISolone acetate 40 MG/ML Outcome: tolerated well, no immediate complications Procedure, treatment alternatives, risks and benefits explained, specific risks discussed. Consent was given by the patient. Immediately prior to procedure a time out was called to verify the correct patient, procedure, equipment, support staff and site/side marked as required. Patient was prepped and draped in the usual sterile fashion.       Clinical Data: No additional findings.   Subjective: Chief Complaint  Patient presents with  . Right Hip - Follow-up    HPI [name] is now 5 months status post right total hip arthroplasty.  She did have a DVT of the right lower leg postop and then repeat ultrasound showed no evidence of DVT in the right lower leg performed 06/11/2017.  She comes in today mainly  complaining of right lateral hip pain and also pain in both feet and swelling in both lower legs.  She states due to her previous back surgery is unable to put on TED hose.  She is a widow.  She has no one that can help her on these stockings.  She also was unable to perform the exercises as shown by physical therapy for trochanteric bursitis.  She is diabetic with a reported hemoglobin A1c of about 6.0.  Review of Systems   Objective: Vital Signs: There were no vitals taken for this visit.  Physical Exam General well-developed well-nourished female no acute distress mood and affect appropriate.  Ambulates with a cane in her left hand. Ortho Exam Right hip good range of motion.  She has tenderness over the trochanteric region.  Bilateral lower legs calves are nontender.  She does have +1 edema bilateral lower legs. Specialty Comments:  No specialty comments available.  Imaging: No results found.   PMFS History: Patient Active Problem List   Diagnosis Date Noted  . Acute deep vein thrombosis (DVT) of right lower extremity (HCC) 04/01/2017  . 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 Avascular necrosis of bone of hip, right (HCC) 12/18/2016  . Trochanteric bursitis, right hip 11/04/2016  . Pain of right hip joint 11/04/2016  . S/P lumbar spinal fusion 07/21/2015   Past Medical History:  Diagnosis Date  . Depression   . Diabetes mellitus without complication (HCC)   . Glaucoma   . Headache    Vestibular migraines  . History of bronchitis   . History of pneumonia   . RA (rheumatoid arthritis) (HCC)   . TIA (transient ischemic attack)   . Vertigo   . Wears glasses     Family History  Problem Relation Age of Onset  . Diabetes Mother   . Stroke Mother   . High blood pressure Father   . Stroke Father   . Diabetes Father     Past Surgical History:  Procedure Laterality Date  . APPENDECTOMY    . BACK SURGERY  2012, 2017  . BILATERAL CARPAL TUNNEL RELEASE      . CESAREAN SECTION     x3  . GLAUCOMA SURGERY    . KNEE ARTHROSCOPY Left   . MOUTH SURGERY    . TONSILLECTOMY    . TOTAL HIP ARTHROPLASTY Right 01/30/2017   Procedure: RIGHT TOTAL HIP ARTHROPLASTY ANTERIOR APPROACH;  Surgeon: Kathryne Hitch, MD;  Location: [facility];  Service: Orthopedics;  Laterality: Right;   Social History   Occupational History  . Occupation: Retired    Comment: Runner, broadcasting/film/video  Tobacco Use  . Smoking status: Never Smoker  . Smokeless tobacco: Never Used  Substance and Sexual Activity  . Alcohol use: No  . Drug use: No  . Sexual activity: Not on file

## 2017-07-08 ENCOUNTER — Ambulatory Visit: Payer: Medicare Other | Admitting: Neurology

## 2017-07-08 ENCOUNTER — Other Ambulatory Visit (INDEPENDENT_AMBULATORY_CARE_PROVIDER_SITE_OTHER): Payer: Self-pay

## 2017-07-08 ENCOUNTER — Telehealth (INDEPENDENT_AMBULATORY_CARE_PROVIDER_SITE_OTHER): Payer: Self-pay | Admitting: Orthopaedic Surgery

## 2017-07-08 DIAGNOSIS — M7989 Other specified soft tissue disorders: Secondary | ICD-10-CM

## 2017-07-08 DIAGNOSIS — M79604 Pain in right leg: Secondary | ICD-10-CM

## 2017-07-08 NOTE — Telephone Encounter (Signed)
Order in now, not sure how we missed that  Sorry---thanks

## 2017-07-08 NOTE — Telephone Encounter (Signed)
Patient was told at her last appt 07/02/17 that either Bronson Curb or Dr. Magnus Ivan would be contacting a vein and vascular specialists for her. She was just wondering if this has been done yet because she has not received a call. Please advise # [phone]

## 2017-07-10 NOTE — Telephone Encounter (Signed)
Patient called back to check on referral to [facility] but I told her it was put in and they should contact her for an appt.  Also, she wants to ask Dr. Magnus Ivan if he would approve for her to use her riding lawnmower starting next week? Please advise # [phone]

## 2017-07-10 NOTE — Telephone Encounter (Signed)
noted 

## 2017-07-10 NOTE — Telephone Encounter (Signed)
Patient aware of the below message  

## 2017-07-10 NOTE — Telephone Encounter (Signed)
I am absolutely fine with her mowing.

## 2017-07-10 NOTE — Telephone Encounter (Signed)
See below, wants to know if she can mow

## 2017-07-13 ENCOUNTER — Ambulatory Visit
Admission: RE | Admit: 2017-07-13 | Discharge: 2017-07-13 | Disposition: A | Discharge status: Home / Self Care | Payer: Medicare Other | Source: Ambulatory Visit | Attending: Neurological Surgery | Admitting: Neurological Surgery

## 2017-07-13 ENCOUNTER — Other Ambulatory Visit: Payer: Medicare Other

## 2017-07-13 DIAGNOSIS — G8929 Other chronic pain: Secondary | ICD-10-CM

## 2017-07-13 DIAGNOSIS — M546 Pain in thoracic spine: Principal | ICD-10-CM

## 2017-07-15 ENCOUNTER — Other Ambulatory Visit: Payer: Self-pay

## 2017-07-15 DIAGNOSIS — M7989 Other specified soft tissue disorders: Secondary | ICD-10-CM

## 2017-08-25 ENCOUNTER — Encounter

## 2017-08-25 ENCOUNTER — Ambulatory Visit: Payer: Medicare Other | Admitting: Surgery

## 2017-08-25 ENCOUNTER — Encounter: Payer: Self-pay | Admitting: Surgery

## 2017-08-25 ENCOUNTER — Ambulatory Visit (HOSPITAL_COMMUNITY)
Admission: RE | Admit: 2017-08-25 | Discharge: 2017-08-25 | Disposition: A | Discharge status: Home / Self Care | Payer: Medicare Other | Source: Ambulatory Visit | Attending: Surgery | Admitting: Surgery

## 2017-08-25 ENCOUNTER — Other Ambulatory Visit: Payer: Self-pay

## 2017-08-25 VITALS — BP 126/63 | HR 54 | Resp 18 | Ht 61.0 in | Wt 190.0 lb

## 2017-08-25 DIAGNOSIS — M7989 Other specified soft tissue disorders: Secondary | ICD-10-CM | POA: Diagnosis present

## 2017-08-25 DIAGNOSIS — I872 Venous insufficiency (chronic) (peripheral): Secondary | ICD-10-CM | POA: Diagnosis not present

## 2017-08-25 NOTE — Progress Notes (Signed)
[facility]  Patient name: [name] MRN: [id number] DOB: 08-07-1945 Sex: female   REQUESTING PROVIDER:    Dr. Rayburn Ma   REASON FOR CONSULT:    Leg swelling  HISTORY OF PRESENT ILLNESS:   [name] is a 72 y.o. female, who is referred today for evaluation of pain and swelling in both of her legs.  She states that she began having issues after having a right hip replacement in December 2018.  She states that in January 2019 she began having significant sensitivities on her left leg and shortly thereafter she felt like she had a band around her right ankle with somebody pulling on it.  She developed significant hypersensitivities to the leg, sometimes where she could not even have a sheet touch it.  She continues to wake up at night with pain.  In February 2019 she had an ultrasound which revealed a femoral-popliteal DVT.  She was treated with Xarelto.  Her repeat ultrasound on 06/13/2017 was negative for DVT and her Xarelto was discontinued.  The patient continues to have significant pain and was referred today for further evaluation.  Patient has undergone low back operations in the past and had a failed fusion.  She does have a history of rheumatoid arthritis.  She is a diabetic.  She does complain of positional and migraine associated vertigo.  PAST MEDICAL HISTORY    Past Medical History:  Diagnosis Date  . Depression   . Diabetes mellitus without complication (HCC)   . Glaucoma   . Headache    Vestibular migraines  . History of bronchitis   . History of pneumonia   . RA (rheumatoid arthritis) (HCC)   . TIA (transient ischemic attack)   . Vertigo   . Wears glasses      FAMILY HISTORY   Family History  Problem Relation Age of Onset  . Diabetes Mother   . Stroke Mother   . Heart disease Mother   . High blood pressure Father   . Stroke Father   . Diabetes Father   . Heart disease  Father   . Heart disease Sister     SOCIAL HISTORY:   Social History   Socioeconomic History  . Marital status: Widowed    Spouse name: Not on file  . Number of children: 4  . Years of education: 49  . Highest education level: Not on file  Occupational History  . Occupation: Retired    Comment: Data processing manager  . Financial resource strain: Not on file  . Food insecurity:    Worry: Not on file    Inability: Not on file  . Transportation needs:    Medical: Not on file    Non-medical: Not on file  Tobacco Use  . Smoking status: Never Smoker  . Smokeless tobacco: Never Used  Substance and Sexual Activity  . Alcohol use: No  . Drug use: No  . Sexual activity: Not on file  Lifestyle  . Physical activity:    Days per week: Not on file    Minutes per session: Not on file  . Stress: Not on file  Relationships  . Social connections:    Talks on phone: Not on file    Gets together: Not on file    Attends religious service: Not on file    Active member of club or organization: Not on file    Attends meetings of clubs or organizations: Not on file  Relationship status: Not on file  . Intimate partner violence:    Fear of current or ex partner: Not on file    Emotionally abused: Not on file    Physically abused: Not on file    Forced sexual activity: Not on file  Other Topics Concern  . Not on file  Social History Narrative   Lives at home alone   Right-handed   Caffeine: sodas    ALLERGIES:    Allergies  Allergen Reactions  . Penicillins Itching and Swelling    Has patient had a PCN reaction causing immediate rash, facial/tongue/throat swelling, SOB or lightheadedness with hypotension: Yes Has patient had a PCN reaction causing severe rash involving mucus membranes or skin necrosis: No Has patient had a PCN reaction that required hospitalization No Has patient had a PCN reaction occurring within the last 10 years: No If all of the above answers are "NO",  then may proceed with Cephalosporin use.     CURRENT MEDICATIONS:    Current Outpatient Medications  Medication Sig Dispense Refill  . aspirin 81 MG chewable tablet Chew 1 tablet (81 mg total) by mouth 2 (two) times daily. 30 tablet 0  . cetirizine (ZYRTEC) 10 MG tablet Take 10 mg by mouth daily.     . diclofenac (VOLTAREN) 75 MG EC tablet TAKE 1 TABLET BY MOUTH ONCE A DAILY IN THE MORNING.    . EPIPEN 2-PAK 0.3 MG/0.3ML SOAJ injection Inject 0.3 mg into the muscle daily as needed. For anaphylactic allergic reaction  1  . ezetimibe (ZETIA) 10 MG tablet Take 10 mg by mouth daily.    . fenofibrate 54 MG tablet Take 54 mg by mouth daily.    . furosemide (LASIX) 20 MG tablet Take 30 mg by mouth daily.    . Golimumab (SIMPONI ARIA IV) Inject 1 Dose into the vein every 8 (eight) weeks.     Marland Kitchen HYDROcodone-acetaminophen (NORCO/VICODIN) 5-325 MG tablet Take 1-2 tablets by mouth every 6 (six) hours as needed for moderate pain. 40 tablet 0  . hydrOXYzine (ATARAX/VISTARIL) 25 MG tablet Take 25 mg by mouth daily.  1  . methocarbamol (ROBAXIN) 500 MG tablet Take 1 tablet (500 mg total) by mouth every 6 (six) hours as needed for muscle spasms. 60 tablet 0  . methotrexate 50 MG/2ML injection Inject 20 mg into the vein every Monday. 0.8 ml    . methylPREDNISolone (MEDROL) 4 MG tablet Medrol dose pack. Take as instructed 21 tablet 0  . nabumetone (RELAFEN) 500 MG tablet Take 1 tablet (500 mg total) by mouth 2 (two) times daily as needed. 60 tablet 1  . NON FORMULARY Inject 1 Syringe into the skin once a week. Allergy Shots received once a week.    Marland Kitchen oxyCODONE-acetaminophen (ROXICET) 5-325 MG tablet Take 1-2 tablets by mouth every 4 (four) hours as needed. 60 tablet 0  . pioglitazone (ACTOS) 30 MG tablet Take 30 mg by mouth daily.    . pregabalin (LYRICA) 75 MG capsule Take 1 capsule (75 mg total) by mouth 2 (two) times daily. 60 capsule 1  . Rivaroxaban 15 & 20 MG TBPK Take as directed on package: Start with  one 15mg  tablet by mouth twice a day with food. On Day 22, switch to one 20mg  tablet once a day with food. 51 each 0  . SUMAtriptan (IMITREX) 100 MG tablet Take 1 tablet (100 mg total) by mouth once as needed. May repeat in 2 hours if headache persists or recurs. 10  tablet 12  . Topiramate ER (QUDEXY XR) 50 MG CS24 sprinkle capsule TAKE 50 MG BY MOUTH AT BEDTIME. MAY INCREASE TO 100 MG BY MOUTH AT BEDTIME AS TOLERATED. 60 each 5  . Vitamin D, Ergocalciferol, (DRISDOL) 50000 units CAPS capsule Take 50,000 Units by mouth every Monday.  0  . golimumab (SIMPONI ARIA) 50 MG/4ML SOLN injection Inject into the vein.     No current facility-administered medications for this visit.     REVIEW OF SYSTEMS:   [X]  denotes positive finding, [ ]  denotes negative finding Cardiac  Comments:  Chest pain or chest pressure:    Shortness of breath upon exertion:    Short of breath when lying flat:    Irregular heart rhythm:        Vascular    Pain in calf, thigh, or hip brought on by ambulation:    Pain in feet at night that wakes you up from your sleep:  x   Blood clot in your veins:    Leg swelling:  x       Pulmonary    Oxygen at home:    Productive cough:     Wheezing:         Neurologic    Sudden weakness in arms or legs:     Sudden numbness in arms or legs:     Sudden onset of difficulty speaking or slurred speech:    Temporary loss of vision in one eye:     Problems with dizziness:         Gastrointestinal    Blood in stool:      Vomited blood:         Genitourinary    Burning when urinating:     Blood in urine:        Psychiatric    Major depression:         Hematologic    Bleeding problems:    Problems with blood clotting too easily:        Skin    Rashes or ulcers:        Constitutional    Fever or chills:     PHYSICAL EXAM:   Vitals:   08/25/17 1355  BP: 126/63  Pulse: (!) 54  Resp: 18  SpO2: 100%  Weight: 190 lb (86.2 kg)  Height: 5\' 1"  (1.549 m)     GENERAL: The patient is a well-nourished female, in no acute distress. The vital signs are documented above. CARDIAC: There is a regular rate and rhythm.  VASCULAR: Palpable dorsalis pedis pulse bilaterally.  1+ pitting edema bilaterally PULMONARY: Nonlabored respirations  MUSCULOSKELETAL: There are no major deformities or cyanosis. NEUROLOGIC: No focal weakness or paresthesias are detected. SKIN: There are no ulcers or rashes noted. PSYCHIATRIC: The patient has a normal affect.  STUDIES:   I have reviewed her old ultrasounds from February and May 2019 which initially showed DVT which resolved with treatment  I have also reviewed her ultrasound from today.  This shows reflux in the left common femoral and great saphenous vein.  There was no evidence of DVT.  On the right side there was no significant reflux or DVT.  ASSESSMENT and PLAN   Bilateral leg pain, right greater than left: Unclear etiology as to why she is having such severe symptoms.  I do not feel that this is related to arterial insufficiency, as she has palpable pedal pulses.  She does not have any venous issues on the right leg currently.  She does have reflux in the left great saphenous vein, which likely explains the more prominent swelling on the left leg.  I told her she would benefit from compression stockings and she is going to look into this but has concerns about being able to get them on.  I suspect that her main symptoms are neuropathic in origin.  She is scheduled to see a pain clinic in the near future which may benefit.  She will follow-up with me on an as-needed basis.   Durene Cal, MD [facility] [phone] Pager [phone]

## 2017-09-08 ENCOUNTER — Encounter: Payer: Self-pay | Admitting: Neurology

## 2017-09-08 ENCOUNTER — Ambulatory Visit: Payer: Medicare Other | Admitting: Neurology

## 2017-09-08 VITALS — BP 127/53 | HR 52 | Ht 62.0 in | Wt 190.0 lb

## 2017-09-08 DIAGNOSIS — I951 Orthostatic hypotension: Secondary | ICD-10-CM

## 2017-09-08 DIAGNOSIS — R42 Dizziness and giddiness: Secondary | ICD-10-CM | POA: Diagnosis not present

## 2017-09-08 MED ORDER — TOPIRAMATE ER 50 MG PO SPRINKLE CAP24: 50.0000 mg | EXTENDED_RELEASE_CAPSULE | Freq: Every day | ORAL | 5 refills | Status: DC

## 2017-09-08 NOTE — Progress Notes (Signed)
[facility]    Provider:  Dr Lucia Gaskins Referring Provider: Juluis Rainier, MD Primary Care Physician:  Juluis Rainier, MD  CC:  Dizziness  She continue to have vertigo. Migraines are improved, just one episode. She continues on the Topamax.  The new dizziness started 4 months ago. She was in PT for her hips and legs, when she laid back she noticed it. When she goes to bed and lay down that is when she feels it. Brief and the room spins, 1 minute, sitting still improves also dizzy in the morning. She is not drinking enough fluids.   HPI:  [name] is a 72 y.o. female here as a referral from Dr. Zachery Dauer for dizziness. Medical history migraines(since  hypertension, hypercholesterolemia, spinal stenosis, diabetes, osteoarthritis and polyarthropathy, trochanteric bursitis, vitamin D deficiency, glaucoma, rheumatoid arthritis, spondylolisthesis.October 17 she woke up with severe spinning, nausea. Then it happened again January 29th worsening, she can incapacitated with a headache for 3 days. She has headaches at the temples with light sensitivity, nausea, pounding/throbbing, feels like her eyes are going to pop out. She has daily headache. She is taking 6-8 extra strength excedrin every day. Worse in the morning and at night. Headaches wake her up. She has gained 14 pounds since July. Blurry vision and vertigo. Severe, continuous, daily headaches. She has neck pain.No other focal neurologic deficits, associated symptoms, inciting events or modifiable factors.  Reviewed notes, labs and imaging from outside physicians, which showed:  CT head showed No acute intracranial abnormalities including mass lesion or mass effect, hydrocephalus, extra-axial fluid collection, midline shift, hemorrhage, or acute infarction, large ischemic events (personally reviewed images)    Review of Systems: Patient complains of symptoms per HPI as well as the following symptoms: headaches.  Pertinent negatives per HPI. All others negative.   Social History   Socioeconomic History  . Marital status: Widowed    Spouse name: Not on file  . Number of children: 4  . Years of education: 83  . Highest education level: Not on file  Occupational History  . Occupation: Retired    Comment: Data processing manager  . Financial resource strain: Not on file  . Food insecurity:    Worry: Not on file    Inability: Not on file  . Transportation needs:    Medical: Not on file    Non-medical: Not on file  Tobacco Use  . Smoking status: Never Smoker  . Smokeless tobacco: Never Used  Substance and Sexual Activity  . Alcohol use: No  . Drug use: No  . Sexual activity: Not on file  Lifestyle  . Physical activity:    Days per week: Not on file    Minutes per session: Not on file  . Stress: Not on file  Relationships  . Social connections:    Talks on phone: Not on file    Gets together: Not on file    Attends religious service: Not on file    Active member of club or organization: Not on file    Attends meetings of clubs or organizations: Not on file    Relationship status: Not on file  . Intimate partner violence:    Fear of current or ex partner: Not on file    Emotionally abused: Not on file    Physically abused: Not on file    Forced sexual activity: Not on file  Other Topics Concern  . Not on file  Social History Narrative   Lives at  home alone   Right-handed   Caffeine: tea daily   Has 10 grandchildren    Family History  Problem Relation Age of Onset  . Diabetes Mother   . Stroke Mother   . Heart disease Mother   . High blood pressure Father   . Stroke Father   . Diabetes Father   . Heart disease Father   . Heart disease Sister     Past Medical History:  Diagnosis Date  . Depression   . Diabetes mellitus without complication (HCC)   . Glaucoma   . Headache    Vestibular migraines  . History of bronchitis   . History of pneumonia   . RA  (rheumatoid arthritis) (HCC)   . TIA (transient ischemic attack)   . Vertigo   . Wears glasses     Past Surgical History:  Procedure Laterality Date  . APPENDECTOMY    . BACK SURGERY  2012, 2017  . BILATERAL CARPAL TUNNEL RELEASE    . CESAREAN SECTION     x3  . GLAUCOMA SURGERY    . KNEE ARTHROSCOPY Left   . MOUTH SURGERY    . TONSILLECTOMY    . TOTAL HIP ARTHROPLASTY Right 01/30/2017   Procedure: RIGHT TOTAL HIP ARTHROPLASTY ANTERIOR APPROACH;  Surgeon: Kathryne Hitch, MD;  Location: [facility];  Service: Orthopedics;  Laterality: Right;    Current Outpatient Medications  Medication Sig Dispense Refill  . cetirizine (ZYRTEC) 10 MG tablet Take 10 mg by mouth daily.     . diclofenac (VOLTAREN) 75 MG EC tablet TAKE 2 TABLET BY MOUTH ONCE A DAILY IN THE MORNING.    Marland Kitchen ezetimibe (ZETIA) 10 MG tablet Take 10 mg by mouth daily.    . fenofibrate 54 MG tablet Take 54 mg by mouth daily.    . Golimumab (SIMPONI ARIA IV) Inject 1 Dose into the vein every 8 (eight) weeks.     Marland Kitchen HYDROcodone-acetaminophen (NORCO/VICODIN) 5-325 MG tablet Take 1-2 tablets by mouth every 6 (six) hours as needed for moderate pain. 40 tablet 0  . hydrOXYzine (ATARAX/VISTARIL) 25 MG tablet Take 25 mg by mouth daily.  1  . methotrexate 50 MG/2ML injection Inject 20 mg into the vein every Monday. 0.8 ml    . NON FORMULARY Inject 1 Syringe into the skin 2 (two) times a week. Allergy Shots    . pioglitazone (ACTOS) 30 MG tablet Take 30 mg by mouth daily.    . pregabalin (LYRICA) 75 MG capsule Take 1 capsule (75 mg total) by mouth 2 (two) times daily. 60 capsule 1  . topiramate ER (QUDEXY XR) 50 MG CS24 sprinkle capsule Take 1 capsule (50 mg total) by mouth at bedtime. May increase to 100 mg by mouth at bedtime as tolerated. 90 each 5  . aspirin 81 MG chewable tablet Chew 1 tablet (81 mg total) by mouth 2 (two) times daily. (Patient not taking: Reported on 08/25/2017) 30 tablet 0  . EPIPEN 2-PAK 0.3 MG/0.3ML SOAJ  injection Inject 0.3 mg into the muscle daily as needed. For anaphylactic allergic reaction  1  . golimumab (SIMPONI ARIA) 50 MG/4ML SOLN injection Inject into the vein.    . SUMAtriptan (IMITREX) 100 MG tablet Take 1 tablet (100 mg total) by mouth once as needed. May repeat in 2 hours if headache persists or recurs. (Patient not taking: Reported on 08/25/2017) 10 tablet 12  . Vitamin D, Ergocalciferol, (DRISDOL) 50000 units CAPS capsule Take 50,000 Units by mouth every Monday.  0   No current facility-administered medications for this visit.     Allergies as of 09/08/2017 - Review Complete 09/08/2017  Allergen Reaction Noted  . Penicillins Itching and Swelling 07/12/2015    Vitals: BP (!) 127/53 (BP Location: Right Arm, Patient Position: Sitting)   Pulse (!) 52   Ht 5\' 2"  (1.575 m)   Wt 190 lb (86.2 kg)   BMI 34.75 kg/m  Last Weight:  Wt Readings from Last 1 Encounters:  09/08/17 190 lb (86.2 kg)   Last Height:   Ht Readings from Last 1 Encounters:  09/08/17 5\' 2"  (1.575 m)    Physical exam: Exam: Gen: NAD, conversant, well nourised, obese, well groomed                     CV: RRR, no MRG. No Carotid Bruits. No peripheral edema, warm, nontender Eyes: Conjunctivae clear without exudates or hemorrhage  Neuro: Detailed Neurologic Exam  Speech:    Speech is normal; fluent and spontaneous with normal comprehension.  Cognition:    The patient is oriented to person, place, and time;     recent and remote memory intact;     language fluent;     normal attention, concentration,     fund of knowledge Cranial Nerves:    The pupils are equal, round, and reactive to light. The fundi are normal and spontaneous venous pulsations are present. Visual fields are full to finger confrontation. Extraocular movements are intact. Trigeminal sensation is intact and the muscles of mastication are normal. The face is symmetric. The palate elevates in the midline. Hearing intact. Voice is normal.  Shoulder shrug is normal. The tongue has normal motion without fasciculations.   Coordination:    Normal finger to nose and heel to shin. Normal rapid alternating movements.   Gait:    Heel-toe and tandem gait are normal.   Motor Observation:    No asymmetry, no atrophy, and no involuntary movements noted. Tone:    Normal muscle tone.    Posture:    Posture is normal. normal erect    Strength:    Strength is V/V in the upper and lower limbs.      Sensation: intact to LT     Reflex Exam:  DTR's:    Deep tendon reflexes in the upper and lower extremities are normal bilaterally.   Toes:    The toes are downgoing bilaterally.   Clonus:    Clonus is absent.      Assessment/Plan:  72 year old with migraines and medication overuse headaches(She was taking 6-8 extra strength excedrin every day) and migraines/vestibular migraines.She stopped the overuse medicaton.   Sounds like BPPV and dehydration: vestibular therapy  Orthostatic hypotension, doesn't hydrate drinks "vitually none" this is likely causing her dizziness, hydrocodone at night may also be making her dizzy. Discussed hydration.   She stopped the excedrin, this is excellent  Sleep evaluation: Morning and nocturnal headaches, tired during the day, gained weight: she declines  F/u with dr Zachery Dauer   Orders Placed This Encounter  Procedures  . Ambulatory referral to Physical Therapy    Cc: Dr. Randol Kern, MD  [facility] [address] [place], [place] [address]  Phone [phone] Fax [phone]

## 2017-09-08 NOTE — Patient Instructions (Addendum)
Vestibular therapy Increase fluids Continue Topiramate   Orthostatic Hypotension Orthostatic hypotension is a sudden drop in blood pressure that happens when you quickly change positions, such as when you get up from a seated or lying position. Blood pressure is a measurement of how strongly, or weakly, your blood is pressing against the walls of your arteries. Arteries are blood vessels that carry blood from your heart throughout your body. When blood pressure is too low, you may not get enough blood to your brain or to the rest of your organs. This can cause weakness, light-headedness, rapid heartbeat, and fainting. This can last for just a few seconds or for up to a few minutes. Orthostatic hypotension is usually not a serious problem. However, if it happens frequently or gets worse, it may be a sign of something more serious. What are the causes? This condition may be caused by:  Sudden changes in posture, such as standing up quickly after you have been sitting or lying down.  Blood loss.  Loss of body fluids (dehydration).  Heart problems.  Hormone (endocrine) problems.  Pregnancy.  Severe infection.  Lack of certain nutrients.  Severe allergic reactions (anaphylaxis).  Certain medicines, such as blood pressure medicine or medicines that make the body lose excess fluids (diuretics). Sometimes, this condition can be caused by not taking medicine as directed, such as taking too much of a certain medicine.  What increases the risk? Certain factors can make you more likely to develop orthostatic hypotension, including:  Age. Risk increases as you get older.  Conditions that affect the heart or the central nervous system.  Taking certain medicines, such as blood pressure medicine or diuretics.  Being pregnant.  What are the signs or symptoms? Symptoms of this condition may include:  Weakness.  Light-headedness.  Dizziness.  Blurred vision.  Fatigue.  Rapid  heartbeat.  Fainting, in severe cases.  How is this diagnosed? This condition is diagnosed based on:  Your medical history.  Your symptoms.  Your blood pressure measurement. Your health care provider will check your blood pressure when you are: ? Lying down. ? Sitting. ? Standing.  A blood pressure reading is recorded as two numbers, such as "120 over 80" (or 120/80). The first ("top") number is called the systolic pressure. It is a measure of the pressure in your arteries as your heart beats. The second ("bottom") number is called the diastolic pressure. It is a measure of the pressure in your arteries when your heart relaxes between beats. Blood pressure is measured in a unit called mm Hg. Healthy blood pressure for adults is 120/80. If your blood pressure is below 90/60, you may be diagnosed with hypotension. Other information or tests that may be used to diagnose orthostatic hypotension include:  Your other vital signs, such as your heart rate and temperature.  Blood tests.  Tilt table test. For this test, you will be safely secured to a table that moves you from a lying position to an upright position. Your heart rhythm and blood pressure will be monitored during the test.  How is this treated? Treatment for this condition may include:  Changing your diet. This may involve eating more salt (sodium) or drinking more water.  Taking medicines to raise your blood pressure.  Changing the dosage of certain medicines you are taking that might be lowering your blood pressure.  Wearing compression stockings. These stockings help to prevent blood clots and reduce swelling in your legs.  In some cases, you may  need to go to the hospital for:  Fluid replacement. This means you will receive fluids through an IV tube.  Blood replacement. This means you will receive donated blood through an IV tube (transfusion).  Treating an infection or heart problems, if this  applies.  Monitoring. You may need to be monitored while medicines that you are taking wear off.  Follow these instructions at home: Eating and drinking   Drink enough fluid to keep your urine clear or pale yellow.  Eat a healthy diet and follow instructions from your health care provider about eating or drinking restrictions. A healthy diet includes: ? Fresh fruits and vegetables. ? Whole grains. ? Lean meats. ? Low-fat dairy products.  Eat extra salt only as directed. Do not add extra salt to your diet unless your health care provider told you to do that.  Eat frequent, small meals.  Avoid standing up suddenly after eating. Medicines  Take over-the-counter and prescription medicines only as told by your health care provider. ? Follow instructions from your health care provider about changing the dosage of your current medicines, if this applies. ? Do not stop or adjust any of your medicines on your own. General instructions  Wear compression stockings as told by your health care provider.  Get up slowly from lying down or sitting positions. This gives your blood pressure a chance to adjust.  Avoid hot showers and excessive heat as directed by your health care provider.  Return to your normal activities as told by your health care provider. Ask your health care provider what activities are safe for you.  Do not use any products that contain nicotine or tobacco, such as cigarettes and e-cigarettes. If you need help quitting, ask your health care provider.  Keep all follow-up visits as told by your health care provider. This is important. Contact a health care provider if:  You vomit.  You have diarrhea.  You have a fever for more than 2-3 days.  You feel more thirsty than usual.  You feel weak and tired. Get help right away if:  You have chest pain.  You have a fast or irregular heartbeat.  You develop numbness in any part of your body.  You cannot move your  arms or your legs.  You have trouble speaking.  You become sweaty or feel lightheaded.  You faint.  You feel short of breath.  You have trouble staying awake.  You feel confused. This information is not intended to replace advice given to you by your health care provider. Make sure you discuss any questions you have with your health care provider. Document Released: 01/11/2002 Document Revised: 10/10/2015 Document Reviewed: 07/14/2015 Elsevier Interactive Patient Education  2018 [employer].   Vertigo Vertigo means that you feel like you are moving when you are not. Vertigo can also make you feel like things around you are moving when they are not. This feeling can come and go at any time. Vertigo often goes away on its own. Follow these instructions at home: Avoid making fast movements. Avoid driving. Avoid using heavy machinery. Avoid doing any task or activity that might cause danger to you or other people if you would have a vertigo attack while you are doing it. Sit down right away if you feel dizzy or have trouble with your balance. Take over-the-counter and prescription medicines only as told by your doctor. Follow instructions from your doctor about which positions or movements you should avoid. Drink enough fluid to keep  your pee (urine) clear or pale yellow. Keep all follow-up visits as told by your doctor. This is important. Contact a doctor if: Medicine does not help your vertigo. You have a fever. Your problems get worse or you have new symptoms. Your family or friends see changes in your behavior. You feel sick to your stomach (nauseous) or you throw up (vomit). You have a "pins and needles" feeling or you are numb in part of your body. Get help right away if: You have trouble moving or talking. You are always dizzy. You pass out (faint). You get very bad headaches. You feel weak or have trouble using your hands, arms, or legs. You have changes in your  hearing. You have changes in your seeing (vision). You get a stiff neck. Bright light starts to bother you. This information is not intended to replace advice given to you by your health care provider. Make sure you discuss any questions you have with your health care provider. Document Released: 10/31/2007 Document Revised: 06/29/2015 Document Reviewed: 05/16/2014 Elsevier Interactive Patient Education  [employer].

## 2017-09-22 ENCOUNTER — Encounter: Payer: Self-pay | Admitting: Surgery

## 2017-09-22 ENCOUNTER — Ambulatory Visit: Payer: Medicare Other

## 2017-09-22 NOTE — Progress Notes (Deleted)
[facility]     History of Present Illness  [name] is a 71 y.o. female, who is referred 08/25/2017 by DR. Blackmon for evaluation of pain and swelling in both of her legs.  She states that she began having issues after having a right hip replacement in December 2018.  She states that in January 2019 she began having significant sensitivities on her left leg and shortly thereafter she felt like she had a band around her right ankle with somebody pulling on it.  She developed significant hypersensitivities to the leg, sometimes where she could not even have a sheet touch it.  She continues to wake up at night with pain.  In February 2019 she had an ultrasound which revealed a femoral-popliteal DVT.  She was treated with Xarelto.  Her repeat ultrasound on 06/13/2017 was negative for DVT and her Xarelto was discontinued.  The patient continues to have significant pain and was referred today for further evaluation.   Dr. Myra Gianotti reviewed her old ultrasounds from February and May 2019 which initially showed DVT which resolved with treatment.  As well as reviewed her ultrasound from 08/25/2017.  This shows reflux in the left common femoral and great saphenous vein.  There was no evidence of DVT.  On the right side there was no significant reflux or DVT.    She does have reflux in the left great saphenous vein, which likely explains the more prominent swelling on the left leg. She was told she would benefit from compression stockings, but was unsure how she would be able to done them.  She was told to follow up as needed.      Past Medical History:  Diagnosis Date  . Depression   . Diabetes mellitus without complication (HCC)   . Glaucoma   . Headache    Vestibular migraines  . History of bronchitis   . History of pneumonia   . RA (rheumatoid arthritis) (HCC)   . TIA (transient ischemic attack)   . Vertigo   . Wears glasses     Past Surgical History:   Procedure Laterality Date  . APPENDECTOMY    . BACK SURGERY  2012, 2017  . BILATERAL CARPAL TUNNEL RELEASE    . CESAREAN SECTION     x3  . GLAUCOMA SURGERY    . KNEE ARTHROSCOPY Left   . MOUTH SURGERY    . TONSILLECTOMY    . TOTAL HIP ARTHROPLASTY Right 01/30/2017   Procedure: RIGHT TOTAL HIP ARTHROPLASTY ANTERIOR APPROACH;  Surgeon: Kathryne Hitch, MD;  Location: [facility];  Service: Orthopedics;  Laterality: Right;    Social History   Socioeconomic History  . Marital status: Widowed    Spouse name: Not on file  . Number of children: 4  . Years of education: 28  . Highest education level: Not on file  Occupational History  . Occupation: Retired    Comment: Data processing manager  . Financial resource strain: Not on file  . Food insecurity:    Worry: Not on file    Inability: Not on file  . Transportation needs:    Medical: Not on file    Non-medical: Not on file  Tobacco Use  . Smoking status: Never Smoker  . Smokeless tobacco: Never Used  Substance and Sexual Activity  . Alcohol use: No  . Drug use: No  . Sexual activity: Not on file  Lifestyle  . Physical activity:    Days per  week: Not on file    Minutes per session: Not on file  . Stress: Not on file  Relationships  . Social connections:    Talks on phone: Not on file    Gets together: Not on file    Attends religious service: Not on file    Active member of club or organization: Not on file    Attends meetings of clubs or organizations: Not on file    Relationship status: Not on file  . Intimate partner violence:    Fear of current or ex partner: Not on file    Emotionally abused: Not on file    Physically abused: Not on file    Forced sexual activity: Not on file  Other Topics Concern  . Not on file  Social History Narrative   Lives at home alone   Right-handed   Caffeine: tea daily   Has 10 grandchildren    Family History  Problem Relation Age of Onset  . Diabetes Mother   . Stroke  Mother   . Heart disease Mother   . High blood pressure Father   . Stroke Father   . Diabetes Father   . Heart disease Father   . Heart disease Sister     Current Outpatient Medications on File Prior to Visit  Medication Sig Dispense Refill  . aspirin 81 MG chewable tablet Chew 1 tablet (81 mg total) by mouth 2 (two) times daily. (Patient not taking: Reported on 08/25/2017) 30 tablet 0  . cetirizine (ZYRTEC) 10 MG tablet Take 10 mg by mouth daily.     . diclofenac (VOLTAREN) 75 MG EC tablet TAKE 2 TABLET BY MOUTH ONCE A DAILY IN THE MORNING.    . EPIPEN 2-PAK 0.3 MG/0.3ML SOAJ injection Inject 0.3 mg into the muscle daily as needed. For anaphylactic allergic reaction  1  . ezetimibe (ZETIA) 10 MG tablet Take 10 mg by mouth daily.    . fenofibrate 54 MG tablet Take 54 mg by mouth daily.    . Golimumab (SIMPONI ARIA IV) Inject 1 Dose into the vein every 8 (eight) weeks.     Marland Kitchen golimumab (SIMPONI ARIA) 50 MG/4ML SOLN injection Inject into the vein.    Marland Kitchen HYDROcodone-acetaminophen (NORCO/VICODIN) 5-325 MG tablet Take 1-2 tablets by mouth every 6 (six) hours as needed for moderate pain. 40 tablet 0  . hydrOXYzine (ATARAX/VISTARIL) 25 MG tablet Take 25 mg by mouth daily.  1  . methotrexate 50 MG/2ML injection Inject 20 mg into the vein every Monday. 0.8 ml    . NON FORMULARY Inject 1 Syringe into the skin 2 (two) times a week. Allergy Shots    . pioglitazone (ACTOS) 30 MG tablet Take 30 mg by mouth daily.    . pregabalin (LYRICA) 75 MG capsule Take 1 capsule (75 mg total) by mouth 2 (two) times daily. 60 capsule 1  . SUMAtriptan (IMITREX) 100 MG tablet Take 1 tablet (100 mg total) by mouth once as needed. May repeat in 2 hours if headache persists or recurs. (Patient not taking: Reported on 08/25/2017) 10 tablet 12  . topiramate ER (QUDEXY XR) 50 MG CS24 sprinkle capsule Take 1 capsule (50 mg total) by mouth at bedtime. May increase to 100 mg by mouth at bedtime as tolerated. 90 each 5  . Vitamin D,  Ergocalciferol, (DRISDOL) 50000 units CAPS capsule Take 50,000 Units by mouth every Monday.  0   No current facility-administered medications on file prior to visit.  Allergies as of 09/22/2017 - Review Complete 09/08/2017  Allergen Reaction Noted  . Penicillins Itching and Swelling 07/12/2015     ROS:   General:  No weight loss, Fever, chills  HEENT: No recent headaches, no nasal bleeding, no visual changes, no sore throat  Neurologic: No dizziness, blackouts, seizures. No recent symptoms of stroke or mini- stroke. No recent episodes of slurred speech, or temporary blindness.  Cardiac: No recent episodes of chest pain/pressure, no shortness of breath at rest.  No shortness of breath with exertion.  Denies history of atrial fibrillation or irregular heartbeat  Vascular: No history of rest pain in feet.  No history of claudication.  No history of non-healing ulcer, No history of DVT   Pulmonary: No home oxygen, no productive cough, no hemoptysis,  No asthma or wheezing  Musculoskeletal:  [ ]  Arthritis, [ ]  Low back pain,  [ ]  Joint pain  Hematologic:No history of hypercoagulable state.  No history of easy bleeding.  No history of anemia  Gastrointestinal: No hematochezia or melena,  No gastroesophageal reflux, no trouble swallowing  Urinary: [ ]  chronic Kidney disease, [ ]  on HD - [ ]  MWF or [ ]  TTHS, [ ]  Burning with urination, [ ]  Frequent urination, [ ]  Difficulty urinating;   Skin: No rashes  Psychological: No history of anxiety,  No history of depression  Physical Examination  There were no vitals filed for this visit.  There is no height or weight on file to calculate BMI.  General:  Alert and oriented, no acute distress HEENT: Normal Neck: No bruit or JVD Pulmonary: Clear to auscultation bilaterally Cardiac: Regular Rate and Rhythm without murmur Abdomen: Soft, non-tender, non-distended, no mass, no scars Skin: No rash Extremity Pulses:  2+ radial, brachial,  femoral, dorsalis pedis, posterior tibial pulses bilaterally Musculoskeletal: No deformity or edema  Neurologic: Upper and lower extremity motor 5/5 and symmetric  DATA:  Assessment:  Plan:  Fabienne Bruns, MD [facility] [place] Office: [phone] Pager: [phone]

## 2017-10-01 ENCOUNTER — Encounter (INDEPENDENT_AMBULATORY_CARE_PROVIDER_SITE_OTHER): Payer: Self-pay | Admitting: Orthopaedic Surgery

## 2017-10-01 ENCOUNTER — Ambulatory Visit (INDEPENDENT_AMBULATORY_CARE_PROVIDER_SITE_OTHER): Payer: Medicare Other | Admitting: Orthopaedic Surgery

## 2017-10-01 DIAGNOSIS — Z96641 Presence of right artificial hip joint: Secondary | ICD-10-CM

## 2017-10-01 DIAGNOSIS — M7061 Trochanteric bursitis, right hip: Secondary | ICD-10-CM

## 2017-10-01 NOTE — Progress Notes (Signed)
The patient is well-known to our service.  She is someone we performed a right total hip arthroplasty on in December of last year.  She is been still dealing with some chronic pain as it relates to her back as well as her right hip and peripheral neuropathy.  She is not a diabetic.  She is now on a pain clinic and they have started her on Cymbalta.  She says in the near future she supposed to have a lumbar spine injection and that physician feels comfortable with providing a steroid injection of the trochanteric area of her right hip trochanteric bursitis and I told her I am fine with that.  She says overall she is doing okay.  She does feel like she is slowly improving.  The Cymbalta was just started yesterday.  On exam I can put her hip on the right side to better motion with minimal discomfort in terms of the groin.  She still has significant pain of the trochanteric area to palpation and the IT band.  She has subjective numbness around the thigh as well as a relates to her surgery.  At this point she will continue to try to increase her mobility.  I would like to see her back in December which will be the one year standpoint from her surgery.  At that visit I would like a low AP pelvis and lateral of her right operative hip.

## 2017-10-12 ENCOUNTER — Other Ambulatory Visit (INDEPENDENT_AMBULATORY_CARE_PROVIDER_SITE_OTHER): Payer: Self-pay | Admitting: Orthopaedic Surgery

## 2017-10-13 NOTE — Telephone Encounter (Signed)
Ok for refill? 

## 2018-01-08 ENCOUNTER — Telehealth: Payer: Self-pay | Admitting: *Deleted

## 2018-01-08 NOTE — Telephone Encounter (Signed)
Completed PA for Topiramate ER 50 mg on Cover My Meds. KEY: [id number]. Anticipate determination within 72 hours.

## 2018-01-09 NOTE — Telephone Encounter (Signed)
Received approval of Topiramate ER 50 mg capsules through 02/04/19. This decision is based on coverage criteria for 2020 benefit year and will be effective 02/04/2018. For any questions/concerns call [phone].   Reference #- [id number]

## 2018-01-14 ENCOUNTER — Other Ambulatory Visit: Payer: Self-pay | Admitting: Neurology

## 2018-02-02 ENCOUNTER — Ambulatory Visit (INDEPENDENT_AMBULATORY_CARE_PROVIDER_SITE_OTHER): Payer: Medicare Other | Admitting: Orthopaedic Surgery

## 2018-02-02 ENCOUNTER — Ambulatory Visit (INDEPENDENT_AMBULATORY_CARE_PROVIDER_SITE_OTHER): Payer: Self-pay

## 2018-02-02 ENCOUNTER — Encounter (INDEPENDENT_AMBULATORY_CARE_PROVIDER_SITE_OTHER): Payer: Self-pay | Admitting: Orthopaedic Surgery

## 2018-02-02 DIAGNOSIS — Z96641 Presence of right artificial hip joint: Secondary | ICD-10-CM

## 2018-02-02 DIAGNOSIS — M7061 Trochanteric bursitis, right hip: Secondary | ICD-10-CM | POA: Diagnosis not present

## 2018-02-02 MED ORDER — METHYLPREDNISOLONE ACETATE 40 MG/ML IJ SUSP
40.0000 mg | INTRAMUSCULAR | Status: AC | PRN
  Administered 2018-02-02: 40 mg via INTRA_ARTICULAR

## 2018-02-02 MED ORDER — LIDOCAINE HCL 1 % IJ SOLN
3.0000 mL | INTRAMUSCULAR | Status: AC | PRN
  Administered 2018-02-02: 3 mL

## 2018-02-02 NOTE — Progress Notes (Signed)
Office Visit Note   Patient: [name]           Date of Birth: May 28, 1945           MRN: [id number] Visit Date: 02/02/2018              Requested by: Juluis Rainier, MD [address] [place], [place] [address] PCP: Juluis Rainier, MD   Assessment & Plan: Visit Diagnoses:  1. History of right hip replacement   2. Trochanteric bursitis, right hip     Plan: Plan she will continue to work on IT band stretching.  Follow-up with [place] on an as-needed basis or if she has any questions or concerns.  Follow-Up Instructions: Return if symptoms worsen or fail to improve.   Orders:  Orders Placed This Encounter  Procedures  . Large Joint Inj  . XR HIP UNILAT W OR W/O PELVIS 1V RIGHT   No orders of the defined types were placed in this encounter.     Procedures: Large Joint Inj: R greater trochanter on 02/02/2018 11:28 AM Indications: pain Details: 22 G 1.5 in needle, lateral approach  Arthrogram: No  Medications: 3 mL lidocaine 1 %; 40 mg methylPREDNISolone acetate 40 MG/ML Outcome: tolerated well, no immediate complications Procedure, treatment alternatives, risks and benefits explained, specific risks discussed. Consent was given by the patient. Immediately prior to procedure a time out was called to verify the correct patient, procedure, equipment, support staff and site/side marked as required. Patient was prepped and draped in the usual sterile fashion.       Clinical Data: No additional findings.   Subjective: Chief Complaint  Patient presents with  . Right Hip - Follow-up    HPI [name] returns today 1 year status post right total hip arthroplasty.  She states overall she is doing better.  She feels that she is slowly getting there.  She still has bouts of trochanteric bursitis.  She is doing stretching her IT band.  Recently been placed on Cymbalta due to her low back pain. Review of Systems See HPI  Objective: Vital Signs: There  were no vitals taken for this visit.  Physical Exam Constitutional:      Appearance: Normal appearance. She is not ill-appearing or diaphoretic.  Neurological:     Mental Status: She is alert and oriented to person, place, and time.  Psychiatric:        Mood and Affect: Mood normal.     Ortho Exam Tenderness over the right trochanteric region.  Surgical incisions well-healed.  Right hip good range of motion passively.  Limited active flexion of the right hip. Specialty Comments:  No specialty comments available.  Imaging: Xr Hip Unilat W Or W/o Pelvis 1v Right  Result Date: 02/02/2018 AP pelvis lateral view of the right hip: No acute fractures.  Bilateral hips well located.  Status post right total hip arthroplasty well-seated components without any evidence of hardware failure.    PMFS History: Patient Active Problem List   Diagnosis Date Noted  . Acute deep vein thrombosis (DVT) of right lower extremity (HCC) 04/01/2017  . 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 Avascular necrosis of bone of hip, right (HCC) 12/18/2016  . Trochanteric bursitis, right hip 11/04/2016  . Pain of right hip joint 11/04/2016  . S/P lumbar spinal fusion 07/21/2015   Past Medical History:  Diagnosis Date  . Depression   . Diabetes mellitus without complication (HCC)   . Glaucoma   .  Headache    Vestibular migraines  . History of bronchitis   . History of pneumonia   . RA (rheumatoid arthritis) (HCC)   . TIA (transient ischemic attack)   . Vertigo   . Wears glasses     Family History  Problem Relation Age of Onset  . Diabetes Mother   . Stroke Mother   . Heart disease Mother   . High blood pressure Father   . Stroke Father   . Diabetes Father   . Heart disease Father   . Heart disease Sister     Past Surgical History:  Procedure Laterality Date  . APPENDECTOMY    . BACK SURGERY  2012, 2017  . BILATERAL CARPAL TUNNEL RELEASE    . CESAREAN SECTION     x3  .  GLAUCOMA SURGERY    . KNEE ARTHROSCOPY Left   . MOUTH SURGERY    . TONSILLECTOMY    . TOTAL HIP ARTHROPLASTY Right 01/30/2017   Procedure: RIGHT TOTAL HIP ARTHROPLASTY ANTERIOR APPROACH;  Surgeon: Kathryne Hitch, MD;  Location: [facility];  Service: Orthopedics;  Laterality: Right;   Social History   Occupational History  . Occupation: Retired    Comment: Runner, broadcasting/film/video  Tobacco Use  . Smoking status: Never Smoker  . Smokeless tobacco: Never Used  Substance and Sexual Activity  . Alcohol use: No  . Drug use: No  . Sexual activity: Not on file

## 2018-05-14 ENCOUNTER — Ambulatory Visit (INDEPENDENT_AMBULATORY_CARE_PROVIDER_SITE_OTHER): Payer: Medicare Other | Admitting: Orthopaedic Surgery

## 2018-05-14 ENCOUNTER — Encounter (INDEPENDENT_AMBULATORY_CARE_PROVIDER_SITE_OTHER): Payer: Self-pay | Admitting: Orthopaedic Surgery

## 2018-05-14 ENCOUNTER — Other Ambulatory Visit: Payer: Self-pay

## 2018-05-14 DIAGNOSIS — M7061 Trochanteric bursitis, right hip: Secondary | ICD-10-CM | POA: Diagnosis not present

## 2018-05-14 MED ORDER — METHYLPREDNISOLONE ACETATE 40 MG/ML IJ SUSP
40.0000 mg | INTRAMUSCULAR | Status: AC | PRN
  Administered 2018-05-14: 40 mg via INTRA_ARTICULAR

## 2018-05-14 MED ORDER — LIDOCAINE HCL 1 % IJ SOLN
3.0000 mL | INTRAMUSCULAR | Status: AC | PRN
  Administered 2018-05-14: 3 mL

## 2018-05-14 NOTE — Progress Notes (Signed)
   Procedure Note  Patient: [name]             Date of Birth: [birth year]           MRN: [id number]             Visit Date: 05/14/2018  HPI: Mrs. Cullop returns today requesting injection for her right hip trochanteric bursitis.  She is given injection 02/02/2018 and it helped for several months but now pain is back.  She is having no radicular symptoms down the leg.  She is status post right total hip arthroplasty 01/30/2017.  She is had no new injury.  No fevers chills.  Patient is diabetic and reports that her hemoglobin A1c was around 5.7.  Physical exam: Right hip slightly limited internal rotation and external rotation due to guarding.  Circumflexion reveals fluid motion.  Surgical incisions well-healed.  Tenderness over the trochanteric region.  There is no rashes skin lesions ulcerations about the right hip.  Procedures: Visit Diagnoses: Trochanteric bursitis, right hip  Large Joint Inj: R greater trochanter on 05/14/2018 11:14 AM Indications: pain Details: 22 G 1.5 in needle, lateral approach  Arthrogram: No  Medications: 3 mL lidocaine 1 %; 40 mg methylPREDNISolone acetate 40 MG/ML Outcome: tolerated well, no immediate complications Procedure, treatment alternatives, risks and benefits explained, specific risks discussed. Consent was given by the patient. Immediately prior to procedure a time out was called to verify the correct patient, procedure, equipment, support staff and site/side marked as required. Patient was prepped and draped in the usual sterile fashion.     Plan: Reviewed IT band stretching exercises which she has not been doing.  She will follow-up with [place] on as-needed basis.  Questions were encouraged and answered.

## 2018-07-08 ENCOUNTER — Encounter: Payer: Self-pay | Admitting: Podiatry

## 2018-07-08 ENCOUNTER — Other Ambulatory Visit: Payer: Self-pay

## 2018-07-08 ENCOUNTER — Ambulatory Visit: Payer: Medicare Other | Admitting: Podiatry

## 2018-07-08 DIAGNOSIS — M65171 Other infective (teno)synovitis, right ankle and foot: Secondary | ICD-10-CM

## 2018-07-08 DIAGNOSIS — L603 Nail dystrophy: Secondary | ICD-10-CM | POA: Diagnosis not present

## 2018-07-08 DIAGNOSIS — M659 Synovitis and tenosynovitis, unspecified: Secondary | ICD-10-CM

## 2018-07-11 NOTE — Progress Notes (Signed)
   Subjective: 73 year old female with PMHx of DM presenting today as a new patient with a chief complaint of detachment of the right great toenail that began about one week ago. She reports associated  Soreness and tenderness. Applying pressure and touching the toe increases the pain. She has trimmed the nail but is concerned because it is loose. Patient is here for further evaluation and treatment.   Past Medical History:  Diagnosis Date  . Depression   . Diabetes mellitus without complication ([place])   . Glaucoma   . Headache    Vestibular migraines  . History of bronchitis   . History of pneumonia   . RA (rheumatoid arthritis) ([place])   . TIA (transient ischemic attack)   . Vertigo   . Wears glasses     Objective:  General: Well developed, nourished, in no acute distress, alert and oriented x3   Dermatology: Hyperkeratotic, discolored, thickened, onychodystrophy of the right great toenail. Skin is warm, dry and supple bilateral lower extremities. Negative for open lesions or macerations.  Vascular: Dorsalis Pedis artery and Posterior Tibial artery pedal pulses palpable. No lower extremity edema noted.   Neruologic: Grossly intact via light touch bilateral.  Musculoskeletal: Muscular strength within normal limits in all groups bilateral. Normal range of motion noted to all pedal and ankle joints.   Assessment:  #1 Dystrophic nail of the right hallux #2 right ankle synovitis   Plan of Care:  1. Patient evaluated.  2. Discussed treatment alternatives and plan of care. Explained nail avulsion procedure and post procedure course to patient. 3. Patient opted for total temporary nail avulsion of the right great toe.  4. Prior to procedure, local anesthesia infiltration utilized using 3 ml of a 50:50 mixture of 2% plain lidocaine and 0.5% plain marcaine in a normal hallux block fashion and a betadine prep performed.  5. Light dressing applied. 6. Continue taking Hydrocodone from pain  management.  7. Injection of 0.5 mLs Celestone Soluspan injected into the right ankle joint.  8. Return to clinic in 3 weeks.   Edrick Kins, DPM [facility]  Dr. Edrick Kins, [address]                                        [place], [place] [address]                Office [phone]  Fax [phone]

## 2018-08-05 ENCOUNTER — Other Ambulatory Visit: Payer: Self-pay

## 2018-08-05 ENCOUNTER — Encounter: Payer: Self-pay | Admitting: Podiatry

## 2018-08-05 ENCOUNTER — Ambulatory Visit: Payer: Medicare Other | Admitting: Podiatry

## 2018-08-05 DIAGNOSIS — B351 Tinea unguium: Secondary | ICD-10-CM | POA: Diagnosis not present

## 2018-08-05 DIAGNOSIS — M79676 Pain in unspecified toe(s): Secondary | ICD-10-CM | POA: Diagnosis not present

## 2018-08-05 DIAGNOSIS — L603 Nail dystrophy: Secondary | ICD-10-CM

## 2018-08-05 DIAGNOSIS — M659 Synovitis and tenosynovitis, unspecified: Secondary | ICD-10-CM

## 2018-08-12 NOTE — Progress Notes (Signed)
   Subjective: Patient presents today status post total temporary nail avulsion procedure of the right great toe that was performed on 07/08/2018. She states she is doing better and denies any significant pain or modifying factors.  She also complains of elongated, thickened nails of the bilateral feet that cause pain while ambulating in shoes. She is unable to trim her own nails. Patient is here for further evaluation and treatment.   Past Medical History:  Diagnosis Date  . Depression   . Diabetes mellitus without complication ([place])   . Glaucoma   . Headache    Vestibular migraines  . History of bronchitis   . History of pneumonia   . RA (rheumatoid arthritis) ([place])   . TIA (transient ischemic attack)   . Vertigo   . Wears glasses     Objective: Skin is warm, dry and supple. Nail bed and respective nail fold appears to be healing appropriately. Open wound to the associated nail fold with a granular wound base and moderate amount of fibrotic tissue. Minimal drainage noted.  Nails are tender, long, thickened and dystrophic with subungual debris, consistent with onychomycosis, 1-5 bilateral. No signs of infection noted. Pain with palpation noted to the anterior, medial and lateral aspects of the right ankle joint.   Assessment: #1 postop temporary total nail avulsion of the right hallux #2 open wound periungual nail fold and nail bed of respective digit.  #3 Onychodystrophic nails 1-5 bilateral with hyperkeratosis of nails.  #4 Onychomycosis of nail due to dermatophyte bilateral #5 Right ankle synovitis    Plan of care: #1 patient was evaluated  #2 debridement of open wound was performed to the periungual border and nail fold of the respective toe using a currette. Antibiotic ointment and Band-Aid was applied. #3 Mechanical debridement of nails 1-5 bilaterally performed using a nail nipper. Filed with dremel without incident.  #4 Continue taking Gabapentin as prescribed by  rheumatologist.  #5 patient is to return to clinic on a PRN  basis.   Edrick Kins, DPM [facility]  Dr. Edrick Kins, [address]                                        [place],  [address]                Office [phone]  Fax [phone]

## 2018-09-26 ENCOUNTER — Other Ambulatory Visit: Payer: Self-pay | Admitting: Neurology

## 2018-10-14 IMAGING — RF DG HIP (WITH PELVIS) OPERATIVE*R*
1 series · 2 of 2 positions shown · non-contrast
Comparison: Preoperative study October 11, 2016

CLINICAL DATA: Intraoperative anterior approach right total hip
joint replacement.

EXAM:
OPERATIVE right HIP (WITH PELVIS IF PERFORMED) 2 VIEWS
TECHNIQUE: Fluoroscopic spot image(s) were submitted for interpretation
post-operatively.

[Series 1: run · 2 of 2 slices shown]
[im 1/2]
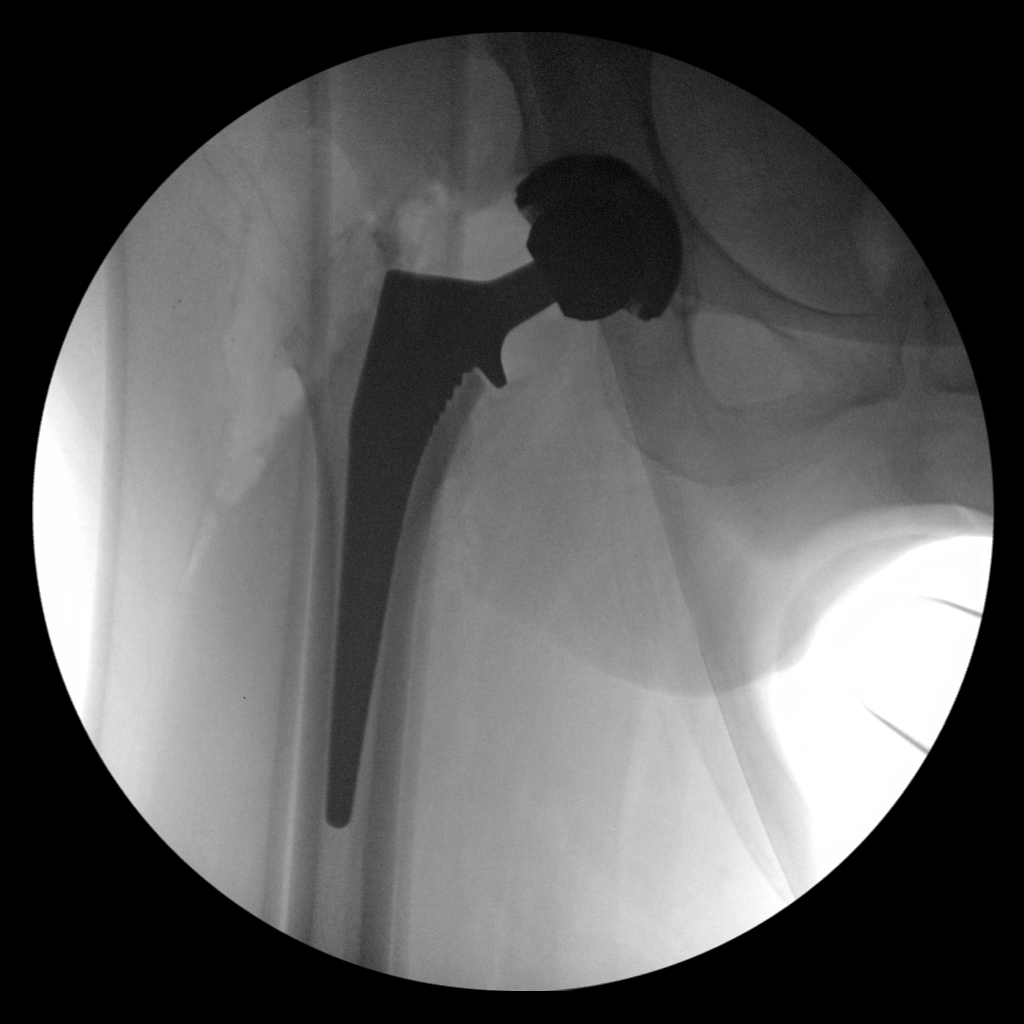
[im 2/2]
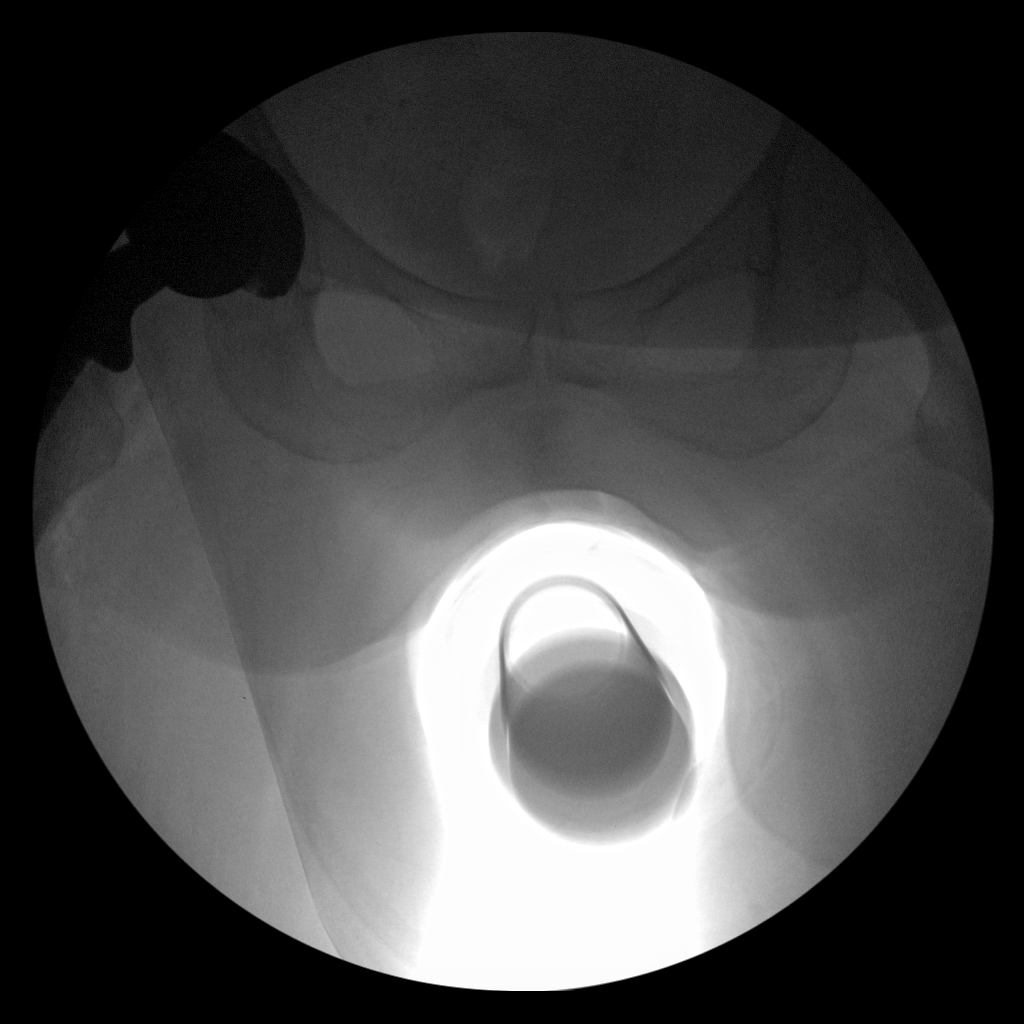

[2 of 2 positions shown; findings below may reference images not displayed]

FINDINGS: Reported fluoro time is 27 seconds. 3.62 mGy dose. The patient has
undergone right total hip joint prosthesis placement. Radiographic
positioning of the prosthetic components is good. The interface with
the native bone appears normal.
IMPRESSION: No immediate postprocedure complication following right hip joint
prosthesis placement.

## 2018-11-16 ENCOUNTER — Ambulatory Visit (INDEPENDENT_AMBULATORY_CARE_PROVIDER_SITE_OTHER): Payer: Medicare Other | Admitting: Orthopaedic Surgery

## 2018-11-16 ENCOUNTER — Encounter: Payer: Self-pay | Admitting: Orthopaedic Surgery

## 2018-11-16 ENCOUNTER — Other Ambulatory Visit: Payer: Self-pay

## 2018-11-16 DIAGNOSIS — M4807 Spinal stenosis, lumbosacral region: Secondary | ICD-10-CM

## 2018-11-16 DIAGNOSIS — Z96641 Presence of right artificial hip joint: Secondary | ICD-10-CM

## 2018-11-16 DIAGNOSIS — M5410 Radiculopathy, site unspecified: Secondary | ICD-10-CM

## 2018-11-16 NOTE — Progress Notes (Signed)
Office Visit Note   Patient: [name]           Date of Birth: 03/25/1945           MRN: [id number] Visit Date: 11/16/2018              Requested by: Leighton Ruff, MD [place],  [place] [address] PCP: Leighton Ruff, MD   Assessment & Plan: Visit Diagnoses:  1. History of right hip replacement   2. Radicular syndrome of right leg     Plan: At this point she is very much struggling with getting her pain under control and chronic pain management is doing well they can to help out with this.  At this point we need to rule out any type of structural deficits that are causing her pain so it is absolutely medically warranted at this point we obtain an MRI of the lumbar spine with contrast to assess for nerve impingement.  We will also need to obtain an MRI of the right hip to rule out any complications with the hip and an MRI of the right ankle to assess for any stress fracture or any other types of issues that can be causing her this severe pain.  We will see her back after these MRIs.  She was released please that we are trying to obtain some types of studies that will help to determine why her pain is where it was. Follow-Up Instructions: Return in about 4 weeks (around 12/14/2018).   Orders:  No orders of the defined types were placed in this encounter.  No orders of the defined types were placed in this encounter.     Procedures: No procedures performed   Clinical Data: No additional findings.   Subjective: Chief Complaint  Patient presents with  . Right Leg - Pain  The patient is well-known to me.  We performed a right total hip arthroplasty on her in December 2018.  She is still dealing with chronic pain and chronic pain syndrome as it involves her right lower extremity.  She still reports severe right ankle and foot pain.  She also is reporting severe right hip pain and right leg pain.  She had a postoperative complication of a DVT and  was on blood thinning medication but she is off of all that.  She does take gabapentin as well as chronic narcotic pain medication.  She is also on rheumatologic medications.  She is very tearful in the office today and said that she is to the point where her leg hurts all the time and she cannot get comfortable.  Hurts at night.  She cannot sleep well.  She says she cannot take it anymore.  She has a history of a lumbar spine fusion from I think L3-S1.  She reports a burning pain in her entire right lower extremity.  HPI  Review of Systems She currently denies any headache, chest pain, shortness of breath, fever, chills, nausea, vomiting  Objective: Vital Signs: There were no vitals taken for this visit.  Physical Exam She is alert and oriented x3 and is very tearful. Ortho Exam Examination of her right lower extremity she has a lot of guarding with examination of the right hip knee leg foot and ankle.  Her skin is intact and there is no evidence of an acute RSD or cellulitis. Specialty Comments:  No specialty comments available.  Imaging: No results found.   PMFS History: Patient Active Problem List  Diagnosis Date Noted  . Acute deep vein thrombosis (DVT) of right lower extremity (HCC) 04/01/2017  . Epistaxis 03/14/2017  . 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 Avascular necrosis of bone of hip, right (HCC) 12/18/2016  . Trochanteric bursitis, right hip 11/04/2016  . Pain of right hip joint 11/04/2016  . S/P lumbar spinal fusion 07/21/2015   Past Medical History:  Diagnosis Date  . Depression   . Diabetes mellitus without complication (HCC)   . Glaucoma   . Headache    Vestibular migraines  . History of bronchitis   . History of pneumonia   . RA (rheumatoid arthritis) (HCC)   . TIA (transient ischemic attack)   . Vertigo   . Wears glasses     Family History  Problem Relation Age of Onset  . Diabetes Mother   . Stroke Mother   . Heart disease  Mother   . High blood pressure Father   . Stroke Father   . Diabetes Father   . Heart disease Father   . Heart disease Sister     Past Surgical History:  Procedure Laterality Date  . APPENDECTOMY    . BACK SURGERY  2012, 2017  . BILATERAL CARPAL TUNNEL RELEASE    . CESAREAN SECTION     x3  . GLAUCOMA SURGERY    . KNEE ARTHROSCOPY Left   . MOUTH SURGERY    . TONSILLECTOMY    . TOTAL HIP ARTHROPLASTY Right 01/30/2017   Procedure: RIGHT TOTAL HIP ARTHROPLASTY ANTERIOR APPROACH;  Surgeon: Kathryne Hitch, MD;  Location: [facility];  Service: Orthopedics;  Laterality: Right;   Social History   Occupational History  . Occupation: Retired    Comment: Runner, broadcasting/film/video  Tobacco Use  . Smoking status: Never Smoker  . Smokeless tobacco: Never Used  Substance and Sexual Activity  . Alcohol use: No  . Drug use: No  . Sexual activity: Not on file

## 2018-12-14 ENCOUNTER — Ambulatory Visit
Admission: RE | Admit: 2018-12-14 | Discharge: 2018-12-14 | Disposition: A | Discharge status: Home / Self Care | Payer: Medicare Other | Source: Ambulatory Visit | Attending: Orthopaedic Surgery | Admitting: Orthopaedic Surgery

## 2018-12-14 ENCOUNTER — Other Ambulatory Visit: Payer: Self-pay

## 2018-12-14 ENCOUNTER — Ambulatory Visit: Payer: Medicare Other | Admitting: Orthopaedic Surgery

## 2018-12-14 DIAGNOSIS — Z96641 Presence of right artificial hip joint: Secondary | ICD-10-CM

## 2018-12-14 DIAGNOSIS — M4807 Spinal stenosis, lumbosacral region: Secondary | ICD-10-CM

## 2018-12-14 MED ORDER — GADOBENATE DIMEGLUMINE 529 MG/ML IV SOLN
20.0000 mL | Freq: Once | INTRAVENOUS | Status: AC | PRN
  Administered 2018-12-14: 20 mL via INTRAVENOUS

## 2018-12-17 ENCOUNTER — Other Ambulatory Visit: Payer: Self-pay

## 2018-12-17 ENCOUNTER — Ambulatory Visit: Payer: Medicare Other | Admitting: Orthopaedic Surgery

## 2018-12-17 ENCOUNTER — Encounter: Payer: Self-pay | Admitting: Orthopaedic Surgery

## 2018-12-17 ENCOUNTER — Ambulatory Visit (INDEPENDENT_AMBULATORY_CARE_PROVIDER_SITE_OTHER): Payer: Medicare Other

## 2018-12-17 DIAGNOSIS — M25551 Pain in right hip: Secondary | ICD-10-CM

## 2018-12-17 DIAGNOSIS — Z96641 Presence of right artificial hip joint: Secondary | ICD-10-CM

## 2018-12-17 NOTE — Addendum Note (Signed)
Addended by: Jacklyn Shell on: 12/17/2018 02:15 PM   Modules accepted: Orders

## 2018-12-17 NOTE — Progress Notes (Signed)
The patient is here today to go over several MRI studies.  She is 2 years out from a right total hip arthroplasty.  She has had pain in her right foot and ankle ever since the date of surgery and she continues to have right hip and groin pain.  She also has pain from her back going down to her foot.  She has previous history of complex lumbar spine surgery as well.  I ended up ordering MRIs of her lumbar spine, her right hip, and her right foot and ankle.  I did obtain plain films of her right hip today.  On exam I can put her right hip foot and ankle through range of motion and she deftly still hurts on the dorsum of her foot and ankle.  There is some slight swelling on both ankles but there is no redness or evidence of infection or instability of the ankle itself.  She is very sensitive to touch in this area.  I can put her right hip through internal X rotation with some pain in the groin.  Her incisions well-healed.  X-rays of her right hip today in the office show no periosteal external bone and no complicating features.  However the MRI is worrisome for potential loosening of the prosthesis given edema around the femur.  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  The MRI of her right foot and ankle did not show any worrisome findings other than plantar fasciitis and some mild tendinitis.  From orthopedic surgery standpoint as far as her hip goes, I would like to obtain a three-phase bone scan to rule out prosthetic loosening.  I am also going obtain a CBC, a sed rate and a CRP in the office today.  Dr. Sherley Bounds is her neurosurgeon and we will see about getting her seen by him to see if there is any correlation of her pain as a relates to the right-sided L3 synovial cyst.  I would like to see her back in 2 weeks to go over her labs with her and a three-phase bone scan to continue to make determinations what is the best course of  treatment for her.

## 2018-12-18 LAB — SEDIMENTATION RATE: Sed Rate: 31 mm/h — ABNORMAL HIGH (ref 0–30)

## 2018-12-18 LAB — CBC WITH DIFFERENTIAL/PLATELET
Absolute Monocytes: 585 cells/uL (ref 200–950)
Basophils Absolute: 53 cells/uL (ref 0–200)
Basophils Relative: 0.7 %
Eosinophils Absolute: 91 cells/uL (ref 15–500)
Eosinophils Relative: 1.2 %
HCT: 36.4 % (ref 35.0–45.0)
Hemoglobin: 11.9 g/dL (ref 11.7–15.5)
Lymphs Abs: 2706 cells/uL (ref 850–3900)
MCH: 31.6 pg (ref 27.0–33.0)
MCHC: 32.7 g/dL (ref 32.0–36.0)
MCV: 96.8 fL (ref 80.0–100.0)
MPV: 9.6 fL (ref 7.5–12.5)
Monocytes Relative: 7.7 %
Neutro Abs: 4165 cells/uL (ref 1500–7800)
Neutrophils Relative %: 54.8 %
Platelets: 419 10*3/uL — ABNORMAL HIGH (ref 140–400)
RBC: 3.76 10*6/uL — ABNORMAL LOW (ref 3.80–5.10)
RDW: 13.1 % (ref 11.0–15.0)
Total Lymphocyte: 35.6 %
WBC: 7.6 10*3/uL (ref 3.8–10.8)

## 2018-12-18 LAB — C-REACTIVE PROTEIN: CRP: 3 mg/L (ref <8.0)

## 2018-12-30 ENCOUNTER — Ambulatory Visit: Payer: Medicare Other | Admitting: Orthopaedic Surgery

## 2018-12-30 ENCOUNTER — Encounter (HOSPITAL_COMMUNITY): Payer: Medicare Other

## 2019-01-07 ENCOUNTER — Ambulatory Visit: Payer: Medicare Other | Admitting: Orthopaedic Surgery

## 2019-01-08 ENCOUNTER — Other Ambulatory Visit: Payer: Self-pay

## 2019-01-08 ENCOUNTER — Encounter (HOSPITAL_COMMUNITY)
Admission: RE | Admit: 2019-01-08 | Discharge: 2019-01-08 | Disposition: A | Discharge status: Home / Self Care | Payer: Medicare Other | Source: Ambulatory Visit | Attending: Orthopaedic Surgery | Admitting: Orthopaedic Surgery

## 2019-01-08 ENCOUNTER — Telehealth: Payer: Self-pay

## 2019-01-08 DIAGNOSIS — Z96641 Presence of right artificial hip joint: Secondary | ICD-10-CM | POA: Insufficient documentation

## 2019-01-08 MED ORDER — TECHNETIUM TC 99M MEDRONATE IV KIT
20.0000 | PACK | Freq: Once | INTRAVENOUS | Status: AC | PRN
  Administered 2019-01-08: 14:00:00 20 via INTRAVENOUS

## 2019-01-08 NOTE — Telephone Encounter (Signed)
NUCLEAR MEDICINE 3-PHASE BONE SCAN report is finished, please review per Diane from Radiology. Contact number is [phone]

## 2019-01-13 ENCOUNTER — Encounter: Payer: Self-pay | Admitting: Orthopaedic Surgery

## 2019-01-13 ENCOUNTER — Ambulatory Visit (INDEPENDENT_AMBULATORY_CARE_PROVIDER_SITE_OTHER): Payer: Medicare Other | Admitting: Orthopaedic Surgery

## 2019-01-13 ENCOUNTER — Other Ambulatory Visit: Payer: Self-pay

## 2019-01-13 DIAGNOSIS — M25551 Pain in right hip: Secondary | ICD-10-CM

## 2019-01-13 DIAGNOSIS — Z96641 Presence of right artificial hip joint: Secondary | ICD-10-CM

## 2019-01-13 DIAGNOSIS — T84030D Mechanical loosening of internal right hip prosthetic joint, subsequent encounter: Secondary | ICD-10-CM

## 2019-01-13 DIAGNOSIS — T84030A Mechanical loosening of internal right hip prosthetic joint, initial encounter: Secondary | ICD-10-CM | POA: Insufficient documentation

## 2019-01-13 NOTE — Progress Notes (Signed)
The patient comes in today after a three-phase bone scan to rule out prosthetic loosening of her right hip.  A MRI was also obtained earlier showing fluid changes around her right hip prosthesis.  She is hurt ever since we replaced her hip.  She did have severe end-stage arthritis in that right hip.  She still reports right hip and groin pain.  She also has right shin and ankle pain.  The bone scan is also concerning for loosening of the hip prosthesis.  We replaced her hip almost 2 years ago.  She has hurt ever since then.  She has still been afebrile.  She denies any illness or sickness.  The right hip hurts whether she is in motion and weightbearing or just sitting and laying down.  It is a deep aching pain.  I can still easily put her right hip the range of motion.  There is no redness around the incision and no evidence of cellulitis.  There is no severe pain with mobility of the hip itself.  At this point I am concerned about her studies showing evidence of loosening of the prosthesis.  I showed her hip model explained in detail about my recommendation for surgical intervention that would involve removing the femoral component.  We would also assess the other components at the time of surgery and obtain cultures to make sure there is not any infection before revising her hip.  I explained in detail with interoperative and postoperative course would involve.  Today we will obtain a CBC, a sed rate, and a CRP as a baseline.  She would like to wait to schedule surgery after the first year due to the holidays and I agree with this as well.  We had a long thorough discussion about this.  All question concerns were answered and addressed.  I do feel at this point it is medically necessary for [place] to revise her right hip out of concern about the possibility of infection.  I do feel that this is more likely aseptic loosening but regardless it is medically necessary that we proceed with surgery at this point given  all the radiographic findings combined with clinical exam findings and her signs and symptoms.

## 2019-01-13 NOTE — Addendum Note (Signed)
Addended by: Jacklyn Shell on: 01/13/2019 03:58 PM   Modules accepted: Orders

## 2019-03-04 ENCOUNTER — Ambulatory Visit: Payer: Medicare Other

## 2019-03-09 ENCOUNTER — Ambulatory Visit: Payer: Medicare Other

## 2019-03-09 ENCOUNTER — Telehealth: Payer: Self-pay

## 2019-03-09 NOTE — Telephone Encounter (Signed)
I called patient to discuss scheduling hip revision.  She stated she has decided to follow up with Dr. Linna Caprice.  She is requesting her records be sent to him.  I transferred her to medical records to follow up on that request.

## 2019-03-12 ENCOUNTER — Ambulatory Visit: Payer: Medicare Other

## 2019-03-14 ENCOUNTER — Ambulatory Visit: Payer: Medicare PPO | Attending: Internal Medicine

## 2019-03-14 DIAGNOSIS — Z23 Encounter for immunization: Secondary | ICD-10-CM

## 2019-03-14 NOTE — Progress Notes (Signed)
   Covid-19 Vaccination Clinic  Name:  [name]    MRN: [id number] DOB: 1945-04-11  03/14/2019  [name] was observed post Covid-19 immunization for 15 minutes without incidence. She was provided with Vaccine Information Sheet and instruction to access the V-Safe system.   [name] was instructed to call 911 with any severe reactions post vaccine: Marland Kitchen Difficulty breathing  . Swelling of your face and throat  . A fast heartbeat  . A bad rash all over your body  . Dizziness and weakness    Immunizations Administered    Name Date Dose VIS Date Route   Pfizer COVID-19 Vaccine 03/14/2019  2:59 PM 0.3 mL 01/15/2019 Intramuscular   Manufacturer: [employer], [employer]   Lot: [id number]   NDC: [address]-3

## 2019-03-30 ENCOUNTER — Ambulatory Visit: Payer: Self-pay

## 2019-04-03 ENCOUNTER — Ambulatory Visit: Payer: Self-pay

## 2019-04-08 ENCOUNTER — Ambulatory Visit: Payer: Medicare PPO | Attending: Internal Medicine

## 2019-04-08 DIAGNOSIS — Z23 Encounter for immunization: Secondary | ICD-10-CM

## 2019-04-08 NOTE — Progress Notes (Signed)
   Covid-19 Vaccination Clinic  Name:  [name]    MRN: [id number] DOB: 10-28-1945  04/08/2019  [name] was observed post Covid-19 immunization for 30 minutes based on pre-vaccination screening without incident. She was provided with Vaccine Information Sheet and instruction to access the V-Safe system.   [name] was instructed to call 911 with any severe reactions post vaccine: Marland Kitchen Difficulty breathing  . Swelling of face and throat  . A fast heartbeat  . A bad rash all over body  . Dizziness and weakness   Immunizations Administered    Name Date Dose VIS Date Route   Pfizer COVID-19 Vaccine 04/08/2019 11:30 AM 0.3 mL 01/15/2019 Intramuscular   Manufacturer: [employer], [employer]   Lot: [id number]   NDC: [address]-4

## 2019-04-16 ENCOUNTER — Telehealth: Payer: Self-pay | Admitting: Neurology

## 2019-04-16 NOTE — Telephone Encounter (Signed)
Pt states that her new insurance does not cover her QUDEXY XR 50 MG CS24 sprinkle capsule and she is needing a change in medicine. Pt states the the insurance does cover Topiramate XR. Please advise.

## 2019-04-17 NOTE — Telephone Encounter (Signed)
We have not seen this patient since 2019.

## 2019-04-17 NOTE — Telephone Encounter (Signed)
Can you call and let patient know we have not seen her since 2019? She would need a follow up appointment for any more medication or any medical advice. If she likes, please schedule her with amy or megan thanks

## 2019-04-21 NOTE — Telephone Encounter (Signed)
I called patient and got her set up for an appointment with Megan on 3/23 to discuss her meds. FYI

## 2019-04-27 ENCOUNTER — Other Ambulatory Visit: Payer: Self-pay

## 2019-04-27 ENCOUNTER — Encounter: Payer: Self-pay | Admitting: Adult Health

## 2019-04-27 ENCOUNTER — Ambulatory Visit: Payer: Medicare PPO | Admitting: Adult Health

## 2019-04-27 VITALS — BP 143/66 | HR 69 | Temp 97.4°F | Ht 59.0 in | Wt 196.0 lb

## 2019-04-27 DIAGNOSIS — G43809 Other migraine, not intractable, without status migrainosus: Secondary | ICD-10-CM | POA: Diagnosis not present

## 2019-04-27 MED ORDER — TOPIRAMATE 50 MG PO TABS: 50.0000 mg | ORAL_TABLET | Freq: Every day | ORAL | 11 refills | Status: DC

## 2019-04-27 NOTE — Patient Instructions (Signed)
Your Plan:  Stop Qudexy  Start Topamax 50 mg at bedtime If your symptoms worsen or you develop new symptoms please let us know.    Thank you for coming to see [place] at [facility]. I hope we have been able to provide you high quality care today.  You may receive a patient satisfaction survey over the next few weeks. We would appreciate your feedback and comments so that we may continue to improve ourselves and the health of our patients.

## 2019-04-27 NOTE — Progress Notes (Addendum)
PATIENT: [name] DOB: 27-Dec-1945  REASON FOR VISIT: follow up HISTORY FROM: patient  HISTORY OF PRESENT ILLNESS: Today 04/27/19 [name] is a 74 year old female with a history of vestibular migraines.  She reports that she has done well on Qudexy.  She reports her insurance no longer covers this medication.  Since she has been taking it she is only had 1 vestibular migraine.  She states that she typically does not have any dizziness unless she has a migraine.  She would like to switch back to Topamax that she originally started with this medication and it worked well for her.  HISTORY  She continue to have vertigo. Migraines are improved, just one episode. She continues on the Topamax.  The new dizziness started 4 months ago. She was in PT for her hips and legs, when she laid back she noticed it. When she goes to bed and lay down that is when she feels it. Brief and the room spins, 1 minute, sitting still improves also dizzy in the morning. She is not drinking enough fluids.   HPI:  [name] is a 74 y.o. female here as a referral from Dr. Drema Dallas for dizziness. Medical history migraines(since  hypertension, hypercholesterolemia, spinal stenosis, diabetes, osteoarthritis and polyarthropathy, trochanteric bursitis, vitamin D deficiency, glaucoma, rheumatoid arthritis, spondylolisthesis.October 17 she woke up with severe spinning, nausea. Then it happened again January 29th worsening, she can incapacitated with a headache for 3 days. She has headaches at the temples with light sensitivity, nausea, pounding/throbbing, feels like her eyes are going to pop out. She has daily headache. She is taking 6-8 extra strength excedrin every day. Worse in the morning and at night. Headaches wake her up. She has gained 14 pounds since July. Blurry vision and vertigo. Severe, continuous, daily headaches. She has neck pain.No other focal neurologic deficits, associated symptoms, inciting  events or modifiable factors.  Reviewed notes, labs and imaging from outside physicians, which showed:  CT head showed No acute intracranial abnormalities including mass lesion or mass effect, hydrocephalus, extra-axial fluid collection, midline shift, hemorrhage, or acute infarction, large ischemic events (personally reviewed images)   REVIEW OF SYSTEMS: Out of a complete 14 system review of symptoms, the patient complains only of the following symptoms, and all other reviewed systems are negative.   See HPI  ALLERGIES: Allergies  Allergen Reactions  . Penicillins Itching and Swelling    Has patient had a PCN reaction causing immediate rash, facial/tongue/throat swelling, SOB or lightheadedness with hypotension: Yes Has patient had a PCN reaction causing severe rash involving mucus membranes or skin necrosis: No Has patient had a PCN reaction that required hospitalization No Has patient had a PCN reaction occurring within the last 10 years: No If all of the above answers are "NO", then may proceed with Cephalosporin use.     HOME MEDICATIONS: Outpatient Medications Prior to Visit  Medication Sig Dispense Refill  . cetirizine (ZYRTEC) 10 MG tablet Take 10 mg by mouth daily.     . DULoxetine (CYMBALTA) 60 MG capsule     . EPIPEN 2-PAK 0.3 MG/0.3ML SOAJ injection Inject 0.3 mg into the muscle daily as needed. For anaphylactic allergic reaction  1  . ezetimibe (ZETIA) 10 MG tablet Take 10 mg by mouth daily.    . fenofibrate 54 MG tablet Take 54 mg by mouth daily.    Marland Kitchen gabapentin (NEURONTIN) 300 MG capsule gabapentin    . Golimumab (SIMPONI ARIA IV) Inject 1 Dose into  the vein every 8 (eight) weeks.     Marland Kitchen golimumab (SIMPONI ARIA) 50 MG/4ML SOLN injection Inject into the vein.    Marland Kitchen HYDROcodone-acetaminophen (NORCO/VICODIN) 5-325 MG tablet Take 1-2 tablets by mouth every 6 (six) hours as needed for moderate pain. 40 tablet 0  . hydrOXYzine (ATARAX/VISTARIL) 25 MG tablet Take 25 mg by  mouth daily.  1  . latanoprost (XALATAN) 0.005 % ophthalmic solution INSTILL 1 DROP INTO BOTH EYES EVERY DAY IN THE EVENING    . methotrexate 50 MG/2ML injection Inject 20 mg into the vein every Monday. 0.8 ml    . NON FORMULARY Inject 1 Syringe into the skin 2 (two) times a week. Allergy Shots    . pioglitazone (ACTOS) 30 MG tablet Take 30 mg by mouth daily.    . QUDEXY XR 50 MG CS24 sprinkle capsule TAKE 50 MG BY MOUTH AT BEDTIME. MAY INCREASE TO 100 MG BY MOUTH AT BEDTIME AS TOLERATED. 180 capsule 1  . SUMAtriptan (IMITREX) 100 MG tablet Take 1 tablet (100 mg total) by mouth once as needed. May repeat in 2 hours if headache persists or recurs. 10 tablet 12  . aspirin 81 MG chewable tablet Chew 1 tablet (81 mg total) by mouth 2 (two) times daily. (Patient not taking: Reported on 04/27/2019) 30 tablet 0  . diclofenac (VOLTAREN) 75 MG EC tablet TAKE 2 TABLET BY MOUTH ONCE A DAILY IN THE MORNING.    . Vitamin D, Ergocalciferol, (DRISDOL) 50000 units CAPS capsule Take 50,000 Units by mouth every Monday.  0  . LYRICA 75 MG capsule TAKE 1 CAPSULE BY MOUTH TWICE A DAY (Patient not taking: Reported on 04/27/2019) 60 capsule 1  . pregabalin (LYRICA) 100 MG capsule Lyrica     No facility-administered medications prior to visit.    PAST MEDICAL HISTORY: Past Medical History:  Diagnosis Date  . Depression   . Diabetes mellitus without complication (HCC)   . Glaucoma   . Headache    Vestibular migraines  . History of bronchitis   . History of pneumonia   . RA (rheumatoid arthritis) (HCC)   . TIA (transient ischemic attack)   . Vertigo   . Wears glasses     PAST SURGICAL HISTORY: Past Surgical History:  Procedure Laterality Date  . APPENDECTOMY    . BACK SURGERY  2012, 2017  . BILATERAL CARPAL TUNNEL RELEASE    . CESAREAN SECTION     x3  . GLAUCOMA SURGERY    . KNEE ARTHROSCOPY Left   . MOUTH SURGERY    . TONSILLECTOMY    . TOTAL HIP ARTHROPLASTY Right 01/30/2017   Procedure: RIGHT  TOTAL HIP ARTHROPLASTY ANTERIOR APPROACH;  Surgeon: Kathryne Hitch, MD;  Location: [facility];  Service: Orthopedics;  Laterality: Right;    FAMILY HISTORY: Family History  Problem Relation Age of Onset  . Diabetes Mother   . Stroke Mother   . Heart disease Mother   . High blood pressure Father   . Stroke Father   . Diabetes Father   . Heart disease Father   . Heart disease Sister     SOCIAL HISTORY: Social History   Socioeconomic History  . Marital status: Widowed    Spouse name: Not on file  . Number of children: 4  . Years of education: 14  . Highest education level: Not on file  Occupational History  . Occupation: Retired    Comment: Runner, broadcasting/film/video  Tobacco Use  . Smoking status: Never Smoker  .  Smokeless tobacco: Never Used  Substance and Sexual Activity  . Alcohol use: No  . Drug use: No  . Sexual activity: Not on file  Other Topics Concern  . Not on file  Social History Narrative   Lives at home alone   Right-handed   Caffeine: tea daily   Has 10 grandchildren   Social Determinants of Health   Financial Resource Strain:   . Difficulty of Paying Living Expenses:   Food Insecurity:   . Worried About Programme researcher, broadcasting/film/video in the Last Year:   . Barista in the Last Year:   Transportation Needs:   . Freight forwarder (Medical):   Marland Kitchen Lack of Transportation (Non-Medical):   Physical Activity:   . Days of Exercise per Week:   . Minutes of Exercise per Session:   Stress:   . Feeling of Stress :   Social Connections:   . Frequency of Communication with Friends and Family:   . Frequency of Social Gatherings with Friends and Family:   . Attends Religious Services:   . Active Member of Clubs or Organizations:   . Attends Banker Meetings:   Marland Kitchen Marital Status:   Intimate Partner Violence:   . Fear of Current or Ex-Partner:   . Emotionally Abused:   Marland Kitchen Physically Abused:   . Sexually Abused:       PHYSICAL EXAM  Vitals:   04/27/19  1415  BP: (!) 143/66  Pulse: 69  Temp: (!) 97.4 F (36.3 C)  Weight: 196 lb (88.9 kg)  Height: 4\' 11"  (1.499 m)   Body mass index is 39.59 kg/m.  Generalized: Well developed, in no acute distress   Neurological examination  Mentation: Alert oriented to time, place, history taking. Follows all commands speech and language fluent Cranial nerve II-XII: Pupils were equal round reactive to light. Extraocular movements were full, visual field were full on confrontational test. Uvula tongue midline. Head turning and shoulder shrug  were normal and symmetric. Motor: The motor testing reveals 5 over 5 strength of all 4 extremities. Good symmetric motor tone is noted throughout.  Sensory: Sensory testing is intact to soft touch on all 4 extremities. No evidence of extinction is noted.  Coordination: Cerebellar testing reveals good finger-nose-finger and heel-to-shin bilaterally.  Gait and station: Uses a cane when ambulating Reflexes: Deep tendon reflexes are symmetric and normal bilaterally.   DIAGNOSTIC DATA (LABS, IMAGING, TESTING) - I reviewed patient records, labs, notes, testing and imaging myself where available.  Lab Results  Component Value Date   WBC 7.6 12/17/2018   HGB 11.9 12/17/2018   HCT 36.4 12/17/2018   MCV 96.8 12/17/2018   PLT 419 (H) 12/17/2018      Component Value Date/Time   NA 138 01/31/2017 0505   K 3.9 01/31/2017 0505   CL 108 01/31/2017 0505   CO2 25 01/31/2017 0505   GLUCOSE 142 (H) 01/31/2017 0505   BUN 15 01/31/2017 0505   CREATININE 0.76 01/31/2017 0505   CALCIUM 9.6 01/31/2017 0505   PROT 7.0 02/25/2007 0520   ALBUMIN 3.6 02/25/2007 0520   AST 17 02/25/2007 0520   ALT 17 02/25/2007 0520   ALKPHOS 68 02/25/2007 0520   BILITOT 0.7 02/25/2007 0520   GFRNONAA >60 01/31/2017 0505   GFRAA >60 01/31/2017 0505   Lab Results  Component Value Date   CHOL (H) 02/25/2007    217        ATP III CLASSIFICATION:  <200  mg/dL   Desirable  786-767   mg/dL   Borderline High  >=209    mg/dL   High   HDL 33 (L) 47/10/6281   LDLCALC (H) 02/25/2007    155        Total Cholesterol/HDL:CHD Risk Coronary Heart Disease Risk Table                     Men   Women  1/2 Average Risk   3.4   3.3   TRIG 144 02/25/2007   CHOLHDL 6.6 02/25/2007   Lab Results  Component Value Date   HGBA1C 6.1 (H) 01/24/2017      ASSESSMENT AND PLAN 74 y.o. year old female  has a past medical history of Depression, Diabetes mellitus without complication (HCC), Glaucoma, Headache, History of bronchitis, History of pneumonia, RA (rheumatoid arthritis) (HCC), TIA (transient ischemic attack), Vertigo, and Wears glasses. here with:  1.  Vestibular migraine  -Stop Qudexy -Start Topamax 50 mg at bedtime -Advised if headache frequency or dizziness increases she should let us know -Follow-up in 1 year or sooner if needed  I spent 25 minutes of face-to-face and non-face-to-face time with patient.  This included previsit chart review, lab review, study review, order entry, electronic health record documentation, patient education.  Butch Penny, MSN, NP-C 04/27/2019, 2:31 PM [facility] [address], [address] [place], [place] [address] [phone]  Made any corrections needed, and agree with history, physical, neuro exam,assessment and plan as stated.     Naomie Dean, MD [facility]

## 2019-05-03 ENCOUNTER — Ambulatory Visit: Payer: Medicare Other | Admitting: Podiatry

## 2019-05-17 ENCOUNTER — Other Ambulatory Visit: Payer: Self-pay | Admitting: Family Medicine

## 2019-05-17 DIAGNOSIS — Z1231 Encounter for screening mammogram for malignant neoplasm of breast: Secondary | ICD-10-CM

## 2019-05-19 ENCOUNTER — Ambulatory Visit: Payer: Medicare PPO | Admitting: Podiatry

## 2019-05-19 ENCOUNTER — Other Ambulatory Visit: Payer: Self-pay

## 2019-05-19 DIAGNOSIS — L603 Nail dystrophy: Secondary | ICD-10-CM | POA: Diagnosis not present

## 2019-05-19 DIAGNOSIS — M659 Synovitis and tenosynovitis, unspecified: Secondary | ICD-10-CM

## 2019-05-19 NOTE — Progress Notes (Signed)
HPI: 74 y.o. female presenting today for evaluation of a mostly detached dystrophic nail to the right great toe.  Patient states that she admits to wearing too tight of shoes that have aggravated the nail. Patient also complains of right ankle pain that has been ongoing for the last 2 years.  Patient had a total hip arthroplasty with replacement approximately 2 years ago has been experiencing pain ever since the surgery.  She states that approximately 2 days after surgery she noticed right ankle pain.  MRI was performed.  She has been known with right ankle pain and believes it is possibly associated to the hip surgery since this was the beginning of onset.  Aggravated by walking.  Past Medical History:  Diagnosis Date  . Depression   . Diabetes mellitus without complication (HCC)   . Glaucoma   . Headache    Vestibular migraines  . History of bronchitis   . History of pneumonia   . RA (rheumatoid arthritis) (HCC)   . TIA (transient ischemic attack)   . Vertigo   . Wears glasses      Past Surgical History:  Procedure Laterality Date  . APPENDECTOMY    . BACK SURGERY  2012, 2017  . BILATERAL CARPAL TUNNEL RELEASE    . CESAREAN SECTION     x3  . GLAUCOMA SURGERY    . KNEE ARTHROSCOPY Left   . MOUTH SURGERY    . TONSILLECTOMY    . TOTAL HIP ARTHROPLASTY Right 01/30/2017   Procedure: RIGHT TOTAL HIP ARTHROPLASTY ANTERIOR APPROACH;  Surgeon: Kathryne Hitch, MD;  Location: [facility];  Service: Orthopedics;  Laterality: Right;   Physical Exam: General: The patient is alert and oriented x3 in no acute distress.  Dermatology: Skin is warm, dry and supple bilateral lower extremities. Negative for open lesions or macerations.  Dystrophic partially detached nail noted to the right hallux  Vascular: Palpable pedal pulses bilaterally. No edema or erythema noted. Capillary refill within normal limits.  Neurological: Epicritic and protective threshold grossly intact bilaterally.    Musculoskeletal Exam: Range of motion within normal limits to all pedal and ankle joints bilateral. Muscle strength 5/5 in all groups bilateral.  There is pain on palpation along the lateral aspect of the right ankle joint.  Pain with range of motion also noted as well.  MRI impression RT ankle 12/14/2018: 1. Thickened and somewhat nodular medial band of the plantar fascia with a small amount of edema in the plantar calcaneal spur. Appearance favors plantar fasciitis over plantar fibromatosis. 2. Mild degenerative findings in the hindfoot and midfoot. 3. Mild distal tibialis posterior tenosynovitis. 4. Thickened superomedial band of the spring ligament, likely from remote injury or chronic micro injury. No overt tear/discontinuity.  Assessment: 1.  Dystrophic, partially detached nail right hallux 2.  Right ankle synovitis   Plan of Care:  1. Patient evaluated.  MRI taken previously reviewed.  2.  It appears that the ankle pain the patient is experiencing is chronic in nature and MRI impression is noncontributory to explain the patient's symptoms 3.  Injection of 0.5 cc Celestone Soluspan injected into the right ankle joint lateral aspect 4.  Recommend good supportive shoes 5.  Return to clinic 4 weeks      Felecia Shelling, DPM [facility]  Dr. Felecia Shelling, DPM    [address]  [place], [place] [address]                Office [phone]  Fax [phone]

## 2019-05-20 ENCOUNTER — Ambulatory Visit: Payer: Medicare PPO

## 2019-05-20 ENCOUNTER — Ambulatory Visit
Admission: RE | Admit: 2019-05-20 | Discharge: 2019-05-20 | Disposition: A | Discharge status: Home / Self Care | Payer: Medicare PPO | Source: Ambulatory Visit | Attending: Family Medicine | Admitting: Family Medicine

## 2019-05-20 DIAGNOSIS — Z1231 Encounter for screening mammogram for malignant neoplasm of breast: Secondary | ICD-10-CM

## 2019-06-07 ENCOUNTER — Ambulatory Visit: Payer: Self-pay | Admitting: Orthopedic Surgery

## 2019-07-15 ENCOUNTER — Ambulatory Visit (HOSPITAL_COMMUNITY)
Admission: RE | Admit: 2019-07-15 | Discharge: 2019-07-15 | Disposition: A | Discharge status: Home / Self Care | Payer: Medicare PPO | Source: Ambulatory Visit | Attending: Internal Medicine | Admitting: Internal Medicine

## 2019-07-15 ENCOUNTER — Other Ambulatory Visit (HOSPITAL_COMMUNITY): Payer: Self-pay | Admitting: Family Medicine

## 2019-07-15 ENCOUNTER — Other Ambulatory Visit: Payer: Self-pay

## 2019-07-15 DIAGNOSIS — Z86718 Personal history of other venous thrombosis and embolism: Secondary | ICD-10-CM

## 2019-07-15 DIAGNOSIS — M7989 Other specified soft tissue disorders: Secondary | ICD-10-CM

## 2019-07-19 NOTE — Pre-Procedure Instructions (Signed)
[id number] [place], [place] - [address]  [place] [address] Phone: [phone] Fax: [phone]    Your procedure is scheduled on Wed. July 28, 2019 from 12:15PM-4:20PM  Report to [facility] Entrance "A" at 10:15AM  Call this number if you have problems the morning of surgery:  [phone]   Remember:  Do not eat after midnight on June 22nd  You may drink clear liquids until 3 hours (9:15AM) prior to surgery time .  Clear liquids allowed are: Water, Juice (non-citric and without pulp - diabetics please choose diet or no sugar options), Carbonated beverages - (diabetics please choose diet or no sugar options), Clear Tea, Black Coffee only (no creamer, milk or cream including half and half), Plain Jell-O only (diabetics please choose diet or no sugar options), Gatorade (diabetics please choose diet or no sugar options) and Plain Popsicles only    Enhanced Recovery after Surgery for Orthopedics Enhanced Recovery after Surgery is a protocol used to improve the stress on your body and your recovery after surgery.  Patient Instructions  . The day of surgery (if you have diabetes): Drink By: 9:15AM o  o Drink ONE (1) Gatorade 2 (G2) as directed. o This drink was given to you during your hospital  pre-op appointment visit.  o Color of the Gatorade may vary. Red is not allowed. o Nothing else to drink after completing the  Gatorade 2 (G2).         If you have questions, please contact your surgeon's office.     Take these medicines the morning of surgery with A SIP OF WATER: Cetirizine (ZYRTEC)     Duloxetine (CYMBALTA)  Ezetimibe (ZETIA) Fenofibrate Gabapentin (NEURONTIN)  HydrOXYzine (ATARAX/VISTARIL)  If Needed: Hydrocodone-acetaminophen (NORCO)  EPIPEN  7 days before surgery (07/21/19), stop taking all Aspirin (unless instructed by your doctor) and Other Aspirin containing products, Vitamins, Fish oils, and Herbal medications. Also  stop all NSAIDS i.e. Advil, Ibuprofen, Motrin, Aleve, Anaprox, Naproxen, BC, Goody Powders, and all Supplements.  . Do not take Pioglitazone (ACTOS) the morning of surgery.  How to Manage Your Diabetes Before and After Surgery  Why is it important to control my blood sugar before and after surgery? . Improving blood sugar levels before and after surgery helps healing and can limit problems. . A way of improving blood sugar control is eating a healthy diet by: o  Eating less sugar and carbohydrates o  Increasing activity/exercise o  Talking with your doctor about reaching your blood sugar goals . High blood sugars (greater than 180 mg/dL) can raise your risk of infections and slow your recovery, so you will need to focus on controlling your diabetes during the weeks before surgery. . Make sure that the doctor who takes care of your diabetes knows about your planned surgery including the date and location.  How do I manage my blood sugar before surgery? . Check your blood sugar at least 4 times a day, starting 2 days before surgery, to make sure that the level is not too high or low. o Check your blood sugar the morning of your surgery when you wake up and every 2 hours until you get to the Short Stay unit. . If your blood sugar is less than 70 mg/dL, you will need to treat for low blood sugar: o Do not take insulin. o Treat a low blood sugar (less than 70 mg/dL) with  cup of clear juice (cranberry or apple), 4 glucose tablets,  OR glucose gel. Recheck blood sugar in 15 minutes after treatment (to make sure it is greater than 70 mg/dL). If your blood sugar is not greater than 70 mg/dL on recheck, call [phone] o  for further instructions.                                 . If your CBG is greater than 220 mg/dL, call the number above for further instructions.  . If you are admitted to the hospital after surgery: o Your blood sugar will be checked by the staff and you will probably be  given insulin after surgery (instead of oral diabetes medicines) to make sure you have good blood sugar levels. o The goal for blood sugar control after surgery is 80-180 mg/dL.  Reviewed and Endorsed by [facility] Patient Education Committee, August 2015   No Smoking of any kind, Tobacco, or Alcohol products 24 hours prior to your procedure. If you use a Cpap at night, you may bring all equipment for your overnight stay.  Special instructions:   [facility]- Preparing For Surgery  Before surgery, you can play an important role. Because skin is not sterile, your skin needs to be as free of germs as possible. You can reduce the number of germs on your skin by washing with CHG (chlorahexidine gluconate) Soap before surgery.  CHG is an antiseptic cleaner which kills germs and bonds with the skin to continue killing germs even after washing.    Please do not use if you have an allergy to CHG or antibacterial soaps. If your skin becomes reddened/irritated stop using the CHG.  Do not shave (including legs and underarms) for at least 48 hours prior to first CHG shower. It is OK to shave your face.  Please follow these instructions carefully.   1. Shower the NIGHT BEFORE SURGERY and the MORNING OF SURGERY with CHG.   2. If you chose to wash your hair, wash your hair first as usual with your normal shampoo.  3. After you shampoo, rinse your hair and body thoroughly to remove the shampoo.  4. Use CHG as you would any other liquid soap. You can apply CHG directly to the skin and wash gently with a scrungie or a clean washcloth.   5. Apply the CHG Soap to your body ONLY FROM THE NECK DOWN.  Do not use on open wounds or open sores. Avoid contact with your eyes, ears, mouth and genitals (private parts). Wash Face and genitals (private parts)  with your normal soap.  6. Wash thoroughly, paying special attention to the area where your surgery will be performed.  7. Thoroughly rinse your body with  warm water from the neck down.  8. DO NOT shower/wash with your normal soap after using and rinsing off the CHG Soap.  9. Pat yourself dry with a CLEAN TOWEL.  10. Wear CLEAN PAJAMAS to bed the night before surgery, wear comfortable clothes the morning of surgery  11. Place CLEAN SHEETS on your bed the night of your first shower and DO NOT SLEEP WITH PETS.  Day of Surgery:            Remember to brush your teeth WITH YOUR REGULAR TOOTHPASTE.  Do not wear jewelry, make-up or nail polish.  Do not wear lotions, powders, or perfumes, or deodorant.  Do not shave 48 hours prior to surgery.    Do not bring valuables  to the hospital.  [facility] is not responsible for any belongings or valuables.  Contacts, dentures or bridgework may not be worn into surgery.   For patients admitted to the hospital, discharge time will be determined by your treatment team.  Patients discharged the day of surgery will not be allowed to drive home, and someone age 15 and over needs to stay with them for 24 hours.   Please wear clean clothes to the hospital/surgery center.    Please read over the following fact sheets that you were given.

## 2019-07-20 ENCOUNTER — Encounter (HOSPITAL_COMMUNITY)
Admission: RE | Admit: 2019-07-20 | Discharge: 2019-07-20 | Disposition: A | Discharge status: Home / Self Care | Payer: Medicare PPO | Source: Ambulatory Visit | Attending: Orthopedic Surgery | Admitting: Orthopedic Surgery

## 2019-07-20 ENCOUNTER — Other Ambulatory Visit: Payer: Self-pay

## 2019-07-20 ENCOUNTER — Encounter (HOSPITAL_COMMUNITY): Payer: Self-pay

## 2019-07-20 DIAGNOSIS — Z01812 Encounter for preprocedural laboratory examination: Secondary | ICD-10-CM | POA: Diagnosis present

## 2019-07-20 HISTORY — DX: Chronic fatigue, unspecified: R53.82

## 2019-07-20 HISTORY — DX: Unspecified cataract: H26.9

## 2019-07-20 HISTORY — DX: Pure hypercholesterolemia, unspecified: E78.00

## 2019-07-20 HISTORY — DX: Fibromyalgia: M79.7

## 2019-07-20 LAB — CBC
HCT: 40.2 % (ref 36.0–46.0)
Hemoglobin: 12.2 g/dL (ref 12.0–15.0)
MCH: 31 pg (ref 26.0–34.0)
MCHC: 30.3 g/dL (ref 30.0–36.0)
MCV: 102 fL — ABNORMAL HIGH (ref 80.0–100.0)
Platelets: 421 10*3/uL — ABNORMAL HIGH (ref 150–400)
RBC: 3.94 MIL/uL (ref 3.87–5.11)
RDW: 14.5 % (ref 11.5–15.5)
WBC: 7.6 10*3/uL (ref 4.0–10.5)
nRBC: 0 % (ref 0.0–0.2)

## 2019-07-20 LAB — URINALYSIS, ROUTINE W REFLEX MICROSCOPIC
Bilirubin Urine: NEGATIVE
Glucose, UA: NEGATIVE mg/dL
Ketones, ur: NEGATIVE mg/dL
Nitrite: NEGATIVE
Protein, ur: NEGATIVE mg/dL
Specific Gravity, Urine: 1.019 (ref 1.005–1.030)
pH: 6 (ref 5.0–8.0)

## 2019-07-20 LAB — COMPREHENSIVE METABOLIC PANEL
ALT: 14 U/L (ref 0–44)
AST: 35 U/L (ref 15–41)
Albumin: 3.9 g/dL (ref 3.5–5.0)
Alkaline Phosphatase: 89 U/L (ref 38–126)
Anion gap: 8 (ref 5–15)
BUN: 16 mg/dL (ref 8–23)
CO2: 24 mmol/L (ref 22–32)
Calcium: 10 mg/dL (ref 8.9–10.3)
Chloride: 106 mmol/L (ref 98–111)
Creatinine, Ser: 1.07 mg/dL — ABNORMAL HIGH (ref 0.44–1.00)
GFR calc Af Amer: >60 mL/min (ref >60)
GFR calc non Af Amer: 52 mL/min — ABNORMAL LOW (ref >60)
Glucose, Bld: 117 mg/dL — ABNORMAL HIGH (ref 70–99)
Potassium: 4.3 mmol/L (ref 3.5–5.1)
Sodium: 138 mmol/L (ref 135–145)
Total Bilirubin: 1.4 mg/dL — ABNORMAL HIGH (ref 0.3–1.2)
Total Protein: 8.1 g/dL (ref 6.5–8.1)

## 2019-07-20 LAB — SURGICAL PCR SCREEN
MRSA, PCR: NEGATIVE
Staphylococcus aureus: NEGATIVE

## 2019-07-20 LAB — GLUCOSE, CAPILLARY: Glucose-Capillary: 122 mg/dL — ABNORMAL HIGH (ref 70–99)

## 2019-07-20 LAB — PROTIME-INR
INR: 1.1 (ref 0.8–1.2)
Prothrombin Time: 13.3 seconds (ref 11.4–15.2)

## 2019-07-20 LAB — TYPE AND SCREEN
ABO/RH(D): O NEG
Antibody Screen: NEGATIVE

## 2019-07-20 LAB — HEMOGLOBIN A1C
Hgb A1c MFr Bld: 6.7 % — ABNORMAL HIGH (ref 4.8–5.6)
Mean Plasma Glucose: 145.59 mg/dL

## 2019-07-20 NOTE — Progress Notes (Signed)
Pt denies SOB, chest pain, and being under the care of a cardiologist. Pt stated that PCP is Dr. Juluis Rainier.  Pt denies having a stress test and cardiac cath. Pt denies having a chest x ray. Nurse requested LOV note and EKG tracing from PCP;' awaiting response. Pt denies having an A1c in the last 2 months. Pt denies having recent labs. Pt stated that fasting CBG ranges from 110-120. Pt reminded to quarantine. Pt verbalized understanding of all pre-op instructions.

## 2019-07-22 NOTE — Anesthesia Preprocedure Evaluation (Addendum)
Anesthesia Evaluation  Patient identified by MRN, date of birth, ID band Patient awake    Reviewed: Allergy & Precautions, NPO status , Patient's Chart, lab work & pertinent test results  Airway Mallampati: II  TM Distance: >3 FB Neck ROM: Full    Dental  (+) Teeth Intact, Dental Advisory Given   Pulmonary    breath sounds clear to auscultation       Cardiovascular  Rhythm:Regular Rate:Normal     Neuro/Psych    GI/Hepatic   Endo/Other  diabetes  Renal/GU      Musculoskeletal   Abdominal (+) + obese,   Peds  Hematology   Anesthesia Other Findings   Reproductive/Obstetrics                           Anesthesia Physical Anesthesia Plan  ASA: III  Anesthesia Plan: General   Post-op Pain Management:    Induction: Intravenous  PONV Risk Score and Plan: Ondansetron and Dexamethasone  Airway Management Planned: Oral ETT  Additional Equipment:   Intra-op Plan:   Post-operative Plan: Extubation in OR  Informed Consent: I have reviewed the patients History and Physical, chart, labs and discussed the procedure including the risks, benefits and alternatives for the proposed anesthesia with the patient or authorized representative who has indicated his/her understanding and acceptance.     Dental advisory given  Plan Discussed with: CRNA and Anesthesiologist  Anesthesia Plan Comments: (EKG 05/03/2019 (copy on patient chart): Sinus rhythm.  Rate 62.  Left axis.  Voltage criteria for LVH.  PCP clearance from Dr. Zachery Dauer dated 05/10/2019 states patient is medically cleared for surgery as long as she is also cleared by rheumatologist.  Clearance from rheumatologist (follows patient for rheumatoid arthritis) Dr. Nickola Major dated 04/26/2019 states patient can continue methotrexate, hold NSAIDs, will receive Simponi infusion 3 weeks after surgery.  Copy of clearance is on chart.)      Anesthesia Quick  Evaluation

## 2019-07-24 ENCOUNTER — Other Ambulatory Visit (HOSPITAL_COMMUNITY)
Admission: RE | Admit: 2019-07-24 | Discharge: 2019-07-24 | Disposition: A | Discharge status: Home / Self Care | Payer: Medicare PPO | Source: Ambulatory Visit | Attending: Orthopedic Surgery | Admitting: Orthopedic Surgery

## 2019-07-24 DIAGNOSIS — Z20822 Contact with and (suspected) exposure to covid-19: Secondary | ICD-10-CM | POA: Diagnosis not present

## 2019-07-24 DIAGNOSIS — Z01812 Encounter for preprocedural laboratory examination: Secondary | ICD-10-CM | POA: Diagnosis present

## 2019-07-24 LAB — SARS CORONAVIRUS 2 (TAT 6-24 HRS): SARS Coronavirus 2: NEGATIVE

## 2019-07-28 ENCOUNTER — Inpatient Hospital Stay (HOSPITAL_COMMUNITY): Payer: Medicare PPO

## 2019-07-28 ENCOUNTER — Inpatient Hospital Stay (HOSPITAL_COMMUNITY)
Admission: RE | Admit: 2019-07-28 | Discharge: 2019-08-04 | DRG: 467 | Disposition: A | Discharge status: Skilled Nursing Facility | Payer: Medicare PPO | Attending: Orthopedic Surgery | Admitting: Orthopedic Surgery

## 2019-07-28 ENCOUNTER — Inpatient Hospital Stay (HOSPITAL_COMMUNITY): Payer: Medicare PPO | Admitting: Physician Assistant

## 2019-07-28 ENCOUNTER — Encounter (HOSPITAL_COMMUNITY): Payer: Self-pay | Admitting: Orthopedic Surgery

## 2019-07-28 ENCOUNTER — Other Ambulatory Visit: Payer: Self-pay

## 2019-07-28 ENCOUNTER — Encounter (HOSPITAL_COMMUNITY)
Admission: RE | Disposition: A | Discharge status: Skilled Nursing Facility | Payer: Self-pay | Source: Home / Self Care | Attending: Orthopedic Surgery

## 2019-07-28 ENCOUNTER — Inpatient Hospital Stay (HOSPITAL_COMMUNITY): Payer: Medicare PPO | Admitting: Certified Registered"

## 2019-07-28 DIAGNOSIS — T84030A Mechanical loosening of internal right hip prosthetic joint, initial encounter: Principal | ICD-10-CM | POA: Diagnosis present

## 2019-07-28 DIAGNOSIS — T8451XA Infection and inflammatory reaction due to internal right hip prosthesis, initial encounter: Secondary | ICD-10-CM | POA: Diagnosis present

## 2019-07-28 DIAGNOSIS — Z880 Allergy status to penicillin: Secondary | ICD-10-CM

## 2019-07-28 DIAGNOSIS — R202 Paresthesia of skin: Secondary | ICD-10-CM | POA: Diagnosis not present

## 2019-07-28 DIAGNOSIS — B952 Enterococcus as the cause of diseases classified elsewhere: Secondary | ICD-10-CM | POA: Diagnosis present

## 2019-07-28 DIAGNOSIS — T84010D Broken internal right hip prosthesis, subsequent encounter: Secondary | ICD-10-CM | POA: Diagnosis not present

## 2019-07-28 DIAGNOSIS — G629 Polyneuropathy, unspecified: Secondary | ICD-10-CM | POA: Diagnosis present

## 2019-07-28 DIAGNOSIS — E7800 Pure hypercholesterolemia, unspecified: Secondary | ICD-10-CM | POA: Diagnosis present

## 2019-07-28 DIAGNOSIS — E119 Type 2 diabetes mellitus without complications: Secondary | ICD-10-CM | POA: Diagnosis present

## 2019-07-28 DIAGNOSIS — Z8673 Personal history of transient ischemic attack (TIA), and cerebral infarction without residual deficits: Secondary | ICD-10-CM | POA: Diagnosis not present

## 2019-07-28 DIAGNOSIS — Z419 Encounter for procedure for purposes other than remedying health state, unspecified: Secondary | ICD-10-CM

## 2019-07-28 DIAGNOSIS — T84018A Broken internal joint prosthesis, other site, initial encounter: Secondary | ICD-10-CM

## 2019-07-28 DIAGNOSIS — M069 Rheumatoid arthritis, unspecified: Secondary | ICD-10-CM | POA: Diagnosis present

## 2019-07-28 DIAGNOSIS — E519 Thiamine deficiency, unspecified: Secondary | ICD-10-CM | POA: Diagnosis present

## 2019-07-28 DIAGNOSIS — Z20822 Contact with and (suspected) exposure to covid-19: Secondary | ICD-10-CM | POA: Diagnosis present

## 2019-07-28 DIAGNOSIS — G8929 Other chronic pain: Secondary | ICD-10-CM | POA: Diagnosis present

## 2019-07-28 DIAGNOSIS — R200 Anesthesia of skin: Secondary | ICD-10-CM | POA: Diagnosis not present

## 2019-07-28 DIAGNOSIS — H409 Unspecified glaucoma: Secondary | ICD-10-CM | POA: Diagnosis present

## 2019-07-28 DIAGNOSIS — Z833 Family history of diabetes mellitus: Secondary | ICD-10-CM | POA: Diagnosis not present

## 2019-07-28 DIAGNOSIS — Z6837 Body mass index (BMI) 37.0-37.9, adult: Secondary | ICD-10-CM

## 2019-07-28 DIAGNOSIS — Y831 Surgical operation with implant of artificial internal device as the cause of abnormal reaction of the patient, or of later complication, without mention of misadventure at the time of the procedure: Secondary | ICD-10-CM | POA: Diagnosis present

## 2019-07-28 DIAGNOSIS — D62 Acute posthemorrhagic anemia: Secondary | ICD-10-CM | POA: Diagnosis present

## 2019-07-28 DIAGNOSIS — M797 Fibromyalgia: Secondary | ICD-10-CM | POA: Diagnosis present

## 2019-07-28 DIAGNOSIS — E669 Obesity, unspecified: Secondary | ICD-10-CM | POA: Diagnosis present

## 2019-07-28 DIAGNOSIS — Z09 Encounter for follow-up examination after completed treatment for conditions other than malignant neoplasm: Secondary | ICD-10-CM

## 2019-07-28 DIAGNOSIS — Z8249 Family history of ischemic heart disease and other diseases of the circulatory system: Secondary | ICD-10-CM | POA: Diagnosis not present

## 2019-07-28 DIAGNOSIS — Z823 Family history of stroke: Secondary | ICD-10-CM | POA: Diagnosis not present

## 2019-07-28 DIAGNOSIS — Z96649 Presence of unspecified artificial hip joint: Secondary | ICD-10-CM

## 2019-07-28 HISTORY — PX: TOTAL HIP REVISION: SHX763

## 2019-07-28 LAB — GLUCOSE, CAPILLARY
Glucose-Capillary: 127 mg/dL — ABNORMAL HIGH (ref 70–99)
Glucose-Capillary: 90 mg/dL (ref 70–99)
Glucose-Capillary: 171 mg/dL — ABNORMAL HIGH (ref 70–99)
Glucose-Capillary: 206 mg/dL — ABNORMAL HIGH (ref 70–99)

## 2019-07-28 SURGERY — TOTAL HIP REVISION
Anesthesia: General | Site: Hip | Laterality: Right

## 2019-07-28 MED ORDER — FENOFIBRATE 54 MG PO TABS
54.0000 mg | ORAL_TABLET | Freq: Every day | ORAL | Status: DC
  Administered 2019-07-29 – 2019-08-04 (×7): 54 mg via ORAL
  Filled 2019-07-28 (×7): qty 1

## 2019-07-28 MED ORDER — FENTANYL CITRATE (PF) 100 MCG/2ML IJ SOLN
INTRAMUSCULAR | Status: DC | PRN
  Administered 2019-07-28 (×5): 50 ug via INTRAVENOUS

## 2019-07-28 MED ORDER — HYDROMORPHONE HCL 1 MG/ML IJ SOLN
INTRAMUSCULAR | Status: AC
  Filled 2019-07-28: qty 1

## 2019-07-28 MED ORDER — METOCLOPRAMIDE HCL 5 MG PO TABS: 5.0000 mg | ORAL_TABLET | Freq: Three times a day (TID) | ORAL | Status: DC | PRN

## 2019-07-28 MED ORDER — LORATADINE 10 MG PO TABS
10.0000 mg | ORAL_TABLET | Freq: Every day | ORAL | Status: DC
  Administered 2019-07-29 – 2019-08-04 (×7): 10 mg via ORAL
  Filled 2019-07-28 (×7): qty 1

## 2019-07-28 MED ORDER — SODIUM CHLORIDE 0.9% FLUSH: INTRAVENOUS | Status: DC | PRN

## 2019-07-28 MED ORDER — ROCURONIUM BROMIDE 10 MG/ML (PF) SYRINGE
PREFILLED_SYRINGE | INTRAVENOUS | Status: AC
  Filled 2019-07-28: qty 10

## 2019-07-28 MED ORDER — ORAL CARE MOUTH RINSE: 15.0000 mL | Freq: Once | OROMUCOSAL | Status: AC

## 2019-07-28 MED ORDER — SUMATRIPTAN SUCCINATE 100 MG PO TABS
100.0000 mg | ORAL_TABLET | Freq: Once | ORAL | Status: DC | PRN
  Filled 2019-07-28: qty 1

## 2019-07-28 MED ORDER — GABAPENTIN 300 MG PO CAPS
300.0000 mg | ORAL_CAPSULE | Freq: Every day | ORAL | Status: DC
  Administered 2019-07-28 – 2019-08-03 (×7): 300 mg via ORAL
  Filled 2019-07-28 (×8): qty 1

## 2019-07-28 MED ORDER — HYDROMORPHONE HCL 1 MG/ML IJ SOLN
0.5000 mg | INTRAMUSCULAR | Status: AC | PRN
  Administered 2019-07-28 (×4): 0.5 mg via INTRAVENOUS

## 2019-07-28 MED ORDER — LIDOCAINE 2% (20 MG/ML) 5 ML SYRINGE
INTRAMUSCULAR | Status: AC
  Filled 2019-07-28: qty 5

## 2019-07-28 MED ORDER — CHLORHEXIDINE GLUCONATE 0.12 % MT SOLN
15.0000 mL | Freq: Once | OROMUCOSAL | Status: AC
  Administered 2019-07-28: 15 mL via OROMUCOSAL
  Filled 2019-07-28: qty 15

## 2019-07-28 MED ORDER — DULOXETINE HCL 60 MG PO CPEP
60.0000 mg | ORAL_CAPSULE | Freq: Every day | ORAL | Status: DC
  Administered 2019-07-29 – 2019-08-04 (×7): 60 mg via ORAL
  Filled 2019-07-28 (×7): qty 1

## 2019-07-28 MED ORDER — SODIUM CHLORIDE 0.9 % IV SOLN: INTRAVENOUS | Status: DC

## 2019-07-28 MED ORDER — ONDANSETRON HCL 4 MG/2ML IJ SOLN: 4.0000 mg | Freq: Four times a day (QID) | INTRAMUSCULAR | Status: DC | PRN

## 2019-07-28 MED ORDER — FENTANYL CITRATE (PF) 100 MCG/2ML IJ SOLN
INTRAMUSCULAR | Status: AC
  Filled 2019-07-28: qty 2

## 2019-07-28 MED ORDER — DOCUSATE SODIUM 100 MG PO CAPS
100.0000 mg | ORAL_CAPSULE | Freq: Two times a day (BID) | ORAL | Status: DC
  Administered 2019-07-29 – 2019-08-04 (×13): 100 mg via ORAL
  Filled 2019-07-28 (×13): qty 1

## 2019-07-28 MED ORDER — ALUM & MAG HYDROXIDE-SIMETH 200-200-20 MG/5ML PO SUSP: 30.0000 mL | ORAL | Status: DC | PRN

## 2019-07-28 MED ORDER — PROPOFOL 10 MG/ML IV BOLUS
INTRAVENOUS | Status: DC | PRN
  Administered 2019-07-28: 170 mg via INTRAVENOUS

## 2019-07-28 MED ORDER — BUPIVACAINE-EPINEPHRINE 0.5% -1:200000 IJ SOLN
INTRAMUSCULAR | Status: AC
  Filled 2019-07-28: qty 1

## 2019-07-28 MED ORDER — IRRISEPT - 450ML BOTTLE WITH 0.05% CHG IN STERILE WATER, USP 99.95% OPTIME
TOPICAL | Status: DC | PRN
Start: 2019-07-28 — End: 2019-07-28
  Administered 2019-07-28: 450 mL

## 2019-07-28 MED ORDER — KETOROLAC TROMETHAMINE 30 MG/ML IJ SOLN: INTRAMUSCULAR | Status: DC | PRN

## 2019-07-28 MED ORDER — LATANOPROST 0.005 % OP SOLN
1.0000 [drp] | Freq: Every day | OPHTHALMIC | Status: DC
  Administered 2019-07-29 – 2019-08-03 (×7): 1 [drp] via OPHTHALMIC
  Filled 2019-07-28: qty 2.5

## 2019-07-28 MED ORDER — ALBUMIN HUMAN 5 % IV SOLN
INTRAVENOUS | Status: DC | PRN
Start: 2019-07-28 — End: 2019-07-28

## 2019-07-28 MED ORDER — POVIDONE-IODINE 10 % EX SWAB: 2.0000 "application " | Freq: Once | CUTANEOUS | Status: DC

## 2019-07-28 MED ORDER — PROPOFOL 10 MG/ML IV BOLUS
INTRAVENOUS | Status: AC
  Filled 2019-07-28: qty 20

## 2019-07-28 MED ORDER — METOCLOPRAMIDE HCL 5 MG/ML IJ SOLN: 5.0000 mg | Freq: Three times a day (TID) | INTRAMUSCULAR | Status: DC | PRN

## 2019-07-28 MED ORDER — ACETAMINOPHEN 325 MG PO TABS: 325.0000 mg | ORAL_TABLET | Freq: Four times a day (QID) | ORAL | Status: DC | PRN

## 2019-07-28 MED ORDER — OXYCODONE HCL 5 MG/5ML PO SOLN: 5.0000 mg | Freq: Once | ORAL | Status: DC | PRN

## 2019-07-28 MED ORDER — EZETIMIBE 10 MG PO TABS
10.0000 mg | ORAL_TABLET | Freq: Every day | ORAL | Status: DC
  Administered 2019-07-29 – 2019-08-04 (×7): 10 mg via ORAL
  Filled 2019-07-28 (×7): qty 1

## 2019-07-28 MED ORDER — OXYCODONE HCL 5 MG PO TABS
ORAL_TABLET | ORAL | Status: AC
  Filled 2019-07-28: qty 2

## 2019-07-28 MED ORDER — ROCURONIUM BROMIDE 100 MG/10ML IV SOLN
INTRAVENOUS | Status: DC | PRN
  Administered 2019-07-28: 60 mg via INTRAVENOUS
  Administered 2019-07-28: 40 mg via INTRAVENOUS
  Administered 2019-07-28: 30 mg via INTRAVENOUS

## 2019-07-28 MED ORDER — HYDROXYZINE HCL 25 MG PO TABS
25.0000 mg | ORAL_TABLET | Freq: Every day | ORAL | Status: DC
  Administered 2019-07-29 – 2019-08-04 (×7): 25 mg via ORAL
  Filled 2019-07-28 (×7): qty 1

## 2019-07-28 MED ORDER — ONDANSETRON HCL 4 MG/2ML IJ SOLN: 4.0000 mg | Freq: Once | INTRAMUSCULAR | Status: DC | PRN

## 2019-07-28 MED ORDER — KETOROLAC TROMETHAMINE 15 MG/ML IJ SOLN
7.5000 mg | Freq: Four times a day (QID) | INTRAMUSCULAR | Status: AC
  Administered 2019-07-28 – 2019-07-29 (×4): 7.5 mg via INTRAVENOUS
  Filled 2019-07-28 (×3): qty 1

## 2019-07-28 MED ORDER — SENNA 8.6 MG PO TABS
1.0000 | ORAL_TABLET | Freq: Two times a day (BID) | ORAL | Status: DC
  Administered 2019-07-29 – 2019-08-04 (×13): 8.6 mg via ORAL
  Filled 2019-07-28 (×13): qty 1

## 2019-07-28 MED ORDER — DEXAMETHASONE SODIUM PHOSPHATE 10 MG/ML IJ SOLN
10.0000 mg | Freq: Once | INTRAMUSCULAR | Status: AC
  Administered 2019-07-29: 10 mg via INTRAVENOUS
  Filled 2019-07-28: qty 1

## 2019-07-28 MED ORDER — FENTANYL CITRATE (PF) 100 MCG/2ML IJ SOLN
25.0000 ug | INTRAMUSCULAR | Status: DC | PRN
  Administered 2019-07-28 (×2): 50 ug via INTRAVENOUS

## 2019-07-28 MED ORDER — OXYCODONE HCL 5 MG PO TABS
5.0000 mg | ORAL_TABLET | ORAL | Status: DC | PRN
  Administered 2019-08-01 (×2): 5 mg via ORAL
  Administered 2019-08-01 – 2019-08-02 (×2): 10 mg via ORAL
  Filled 2019-07-28: qty 1
  Filled 2019-07-28 (×3): qty 2
  Filled 2019-07-28: qty 1
  Filled 2019-07-28: qty 2
  Filled 2019-07-28 (×2): qty 1
  Filled 2019-07-28 (×2): qty 2

## 2019-07-28 MED ORDER — FOLIC ACID 1 MG PO TABS
1.0000 mg | ORAL_TABLET | Freq: Every day | ORAL | Status: DC
  Administered 2019-07-29 – 2019-08-04 (×7): 1 mg via ORAL
  Filled 2019-07-28 (×7): qty 1

## 2019-07-28 MED ORDER — VITAMIN D 25 MCG (1000 UNIT) PO TABS
2000.0000 [IU] | ORAL_TABLET | Freq: Every day | ORAL | Status: DC
  Administered 2019-07-29 – 2019-08-04 (×7): 2000 [IU] via ORAL
  Filled 2019-07-28 (×7): qty 2

## 2019-07-28 MED ORDER — ACETAMINOPHEN 10 MG/ML IV SOLN
1000.0000 mg | INTRAVENOUS | Status: AC
  Administered 2019-07-28: 1000 mg via INTRAVENOUS
  Filled 2019-07-28: qty 100

## 2019-07-28 MED ORDER — LIDOCAINE 2% (20 MG/ML) 5 ML SYRINGE
INTRAMUSCULAR | Status: DC | PRN
  Administered 2019-07-28: 60 mg via INTRAVENOUS

## 2019-07-28 MED ORDER — POLYETHYLENE GLYCOL 3350 17 G PO PACK
17.0000 g | PACK | Freq: Every day | ORAL | Status: DC | PRN
  Administered 2019-08-02: 17 g via ORAL
  Filled 2019-07-28: qty 1

## 2019-07-28 MED ORDER — OXYCODONE HCL 5 MG PO TABS
10.0000 mg | ORAL_TABLET | Freq: Once | ORAL | Status: AC
  Administered 2019-07-28: 10 mg via ORAL
  Filled 2019-07-28: qty 2

## 2019-07-28 MED ORDER — SODIUM CHLORIDE FLUSH 0.9 % IV SOLN
INTRAVENOUS | Status: AC
  Filled 2019-07-28: qty 30

## 2019-07-28 MED ORDER — DEXAMETHASONE SODIUM PHOSPHATE 10 MG/ML IJ SOLN
INTRAMUSCULAR | Status: DC | PRN
  Administered 2019-07-28: 5 mg via INTRAVENOUS

## 2019-07-28 MED ORDER — OXYCODONE HCL 5 MG PO TABS: 5.0000 mg | ORAL_TABLET | Freq: Once | ORAL | Status: DC | PRN

## 2019-07-28 MED ORDER — PHENOL 1.4 % MT LIQD: 1.0000 | OROMUCOSAL | Status: DC | PRN

## 2019-07-28 MED ORDER — 0.9 % SODIUM CHLORIDE (POUR BTL) OPTIME
TOPICAL | Status: DC | PRN
  Administered 2019-07-28: 1000 mL

## 2019-07-28 MED ORDER — POVIDONE-IODINE 10 % EX SWAB
2.0000 "application " | Freq: Once | CUTANEOUS | Status: AC
  Administered 2019-07-28: 2 via TOPICAL

## 2019-07-28 MED ORDER — EPHEDRINE SULFATE-NACL 50-0.9 MG/10ML-% IV SOSY
PREFILLED_SYRINGE | INTRAVENOUS | Status: DC | PRN
  Administered 2019-07-28 (×2): 10 mg via INTRAVENOUS

## 2019-07-28 MED ORDER — TRANEXAMIC ACID-NACL 1000-0.7 MG/100ML-% IV SOLN
1000.0000 mg | INTRAVENOUS | Status: AC
  Administered 2019-07-28: 1000 mg via INTRAVENOUS
  Filled 2019-07-28: qty 100

## 2019-07-28 MED ORDER — TOPIRAMATE 25 MG PO TABS
50.0000 mg | ORAL_TABLET | Freq: Every day | ORAL | Status: DC
  Administered 2019-07-29 – 2019-08-03 (×7): 50 mg via ORAL
  Filled 2019-07-28 (×8): qty 2

## 2019-07-28 MED ORDER — KETOROLAC TROMETHAMINE 30 MG/ML IJ SOLN
INTRAMUSCULAR | Status: AC
  Filled 2019-07-28: qty 1

## 2019-07-28 MED ORDER — PHENYLEPHRINE HCL-NACL 10-0.9 MG/250ML-% IV SOLN
INTRAVENOUS | Status: DC | PRN
Start: 2019-07-28 — End: 2019-07-28
  Administered 2019-07-28: 20 ug/min via INTRAVENOUS

## 2019-07-28 MED ORDER — KETOROLAC TROMETHAMINE 15 MG/ML IJ SOLN
INTRAMUSCULAR | Status: AC
  Filled 2019-07-28: qty 1

## 2019-07-28 MED ORDER — ONDANSETRON HCL 4 MG/2ML IJ SOLN
INTRAMUSCULAR | Status: DC | PRN
  Administered 2019-07-28: 4 mg via INTRAVENOUS

## 2019-07-28 MED ORDER — HYDROMORPHONE HCL 1 MG/ML IJ SOLN
0.5000 mg | INTRAMUSCULAR | Status: DC | PRN
  Administered 2019-07-29 – 2019-08-01 (×7): 1 mg via INTRAVENOUS
  Filled 2019-07-28 (×7): qty 1

## 2019-07-28 MED ORDER — OXYCODONE HCL 5 MG PO TABS
10.0000 mg | ORAL_TABLET | ORAL | Status: DC | PRN
  Administered 2019-07-29 (×2): 15 mg via ORAL
  Administered 2019-07-29: 10 mg via ORAL
  Administered 2019-07-29 – 2019-08-02 (×8): 15 mg via ORAL
  Administered 2019-08-02: 10 mg via ORAL
  Administered 2019-08-03 (×5): 15 mg via ORAL
  Administered 2019-08-04: 10 mg via ORAL
  Filled 2019-07-28 (×9): qty 3
  Filled 2019-07-28: qty 2
  Filled 2019-07-28 (×4): qty 3
  Filled 2019-07-28: qty 2
  Filled 2019-07-28: qty 3

## 2019-07-28 MED ORDER — APIXABAN 2.5 MG PO TABS
2.5000 mg | ORAL_TABLET | Freq: Two times a day (BID) | ORAL | Status: DC
  Administered 2019-07-29 – 2019-08-04 (×13): 2.5 mg via ORAL
  Filled 2019-07-28 (×16): qty 1

## 2019-07-28 MED ORDER — CLINDAMYCIN PHOSPHATE 900 MG/50ML IV SOLN
900.0000 mg | INTRAVENOUS | Status: AC
  Administered 2019-07-28: 900 mg via INTRAVENOUS
  Filled 2019-07-28: qty 50

## 2019-07-28 MED ORDER — VANCOMYCIN HCL IN DEXTROSE 1-5 GM/200ML-% IV SOLN
1000.0000 mg | Freq: Two times a day (BID) | INTRAVENOUS | Status: AC
  Administered 2019-07-29: 1000 mg via INTRAVENOUS
  Filled 2019-07-28: qty 200

## 2019-07-28 MED ORDER — LACTATED RINGERS IV SOLN: INTRAVENOUS | Status: DC

## 2019-07-28 MED ORDER — ONDANSETRON HCL 4 MG PO TABS: 4.0000 mg | ORAL_TABLET | Freq: Four times a day (QID) | ORAL | Status: DC | PRN

## 2019-07-28 MED ORDER — SODIUM CHLORIDE 0.9 % IR SOLN
Status: DC | PRN
  Administered 2019-07-28: 3000 mL

## 2019-07-28 MED ORDER — MENTHOL 3 MG MT LOZG: 1.0000 | LOZENGE | OROMUCOSAL | Status: DC | PRN

## 2019-07-28 MED ORDER — VANCOMYCIN HCL 1500 MG/300ML IV SOLN
1500.0000 mg | INTRAVENOUS | Status: AC
  Administered 2019-07-28: 1500 mg via INTRAVENOUS
  Filled 2019-07-28 (×2): qty 300

## 2019-07-28 MED ORDER — SUGAMMADEX SODIUM 200 MG/2ML IV SOLN
INTRAVENOUS | Status: DC | PRN
  Administered 2019-07-28: 400 mg via INTRAVENOUS

## 2019-07-28 MED ORDER — DIPHENHYDRAMINE HCL 12.5 MG/5ML PO ELIX: 12.5000 mg | ORAL_SOLUTION | ORAL | Status: DC | PRN

## 2019-07-28 MED ORDER — FENTANYL CITRATE (PF) 250 MCG/5ML IJ SOLN
INTRAMUSCULAR | Status: AC
  Filled 2019-07-28: qty 5

## 2019-07-28 MED ORDER — PIOGLITAZONE HCL 30 MG PO TABS
30.0000 mg | ORAL_TABLET | Freq: Every day | ORAL | Status: DC
  Administered 2019-07-29 – 2019-08-04 (×7): 30 mg via ORAL
  Filled 2019-07-28 (×7): qty 1

## 2019-07-28 MED ORDER — BUPIVACAINE-EPINEPHRINE (PF) 0.5% -1:200000 IJ SOLN: INTRAMUSCULAR | Status: DC | PRN

## 2019-07-28 SURGICAL SUPPLY — 73 items
ADH SKN CLS APL DERMABOND .7 (GAUZE/BANDAGES/DRESSINGS)
APL PRP STRL LF DISP 70% ISPRP (MISCELLANEOUS) ×1
BLADE SAW GIGLI 510 (BLADE) ×1 IMPLANT
BLADE SAW GIGLI 510MM (BLADE) ×1
BLADE SAW SGTL 18X1.27X75 (BLADE) ×3 IMPLANT
BLADE SAW SGTL 18X1.27X75MM (BLADE) ×2
BUR CROSS CUT (BURR) ×3
BUR ROUND FLUTED 5 RND (BURR) ×1 IMPLANT
BUR ROUND FLUTED 5MM RND (BURR) ×1
BUR SRG MED 1.6XXCUT FSSR (BURR) IMPLANT
BURR SRG MED 1.6XXCUT FSSR (BURR) ×1
CHLORAPREP W/TINT 26 (MISCELLANEOUS) ×3 IMPLANT
COVER SURGICAL LIGHT HANDLE (MISCELLANEOUS) ×5 IMPLANT
COVER WAND RF STERILE (DRAPES) ×1 IMPLANT
DERMABOND ADVANCED (GAUZE/BANDAGES/DRESSINGS)
DERMABOND ADVANCED .7 DNX12 (GAUZE/BANDAGES/DRESSINGS) ×1 IMPLANT
DRAPE C-ARM 42X72 X-RAY (DRAPES) ×2 IMPLANT
DRAPE C-ARMOR (DRAPES) ×2 IMPLANT
DRAPE HIP W/POCKET STRL (MISCELLANEOUS) ×3 IMPLANT
DRAPE INCISE IOBAN 66X45 STRL (DRAPES) ×3 IMPLANT
DRAPE INCISE IOBAN 85X60 (DRAPES) ×3 IMPLANT
DRAPE POUCH INSTRU U-SHP 10X18 (DRAPES) ×3 IMPLANT
DRAPE SURG 17X11 SM STRL (DRAPES) ×3 IMPLANT
DRAPE U-SHAPE 47X51 STRL (DRAPES) ×3 IMPLANT
DRESSING PREVENA PLUS CUSTOM (GAUZE/BANDAGES/DRESSINGS) IMPLANT
DRSG AQUACEL AG ADV 3.5X10 (GAUZE/BANDAGES/DRESSINGS) ×1 IMPLANT
DRSG PREVENA PLUS CUSTOM (GAUZE/BANDAGES/DRESSINGS) ×3
ELECT BLADE 4.0 EZ CLEAN MEGAD (MISCELLANEOUS) ×3
ELECT BLADE TIP CTD 4 INCH (ELECTRODE) ×3 IMPLANT
ELECT REM PT RETURN 9FT ADLT (ELECTROSURGICAL) ×3
ELECTRODE BLDE 4.0 EZ CLN MEGD (MISCELLANEOUS) IMPLANT
ELECTRODE REM PT RTRN 9FT ADLT (ELECTROSURGICAL) ×1 IMPLANT
GLOVE BIO SURGEON STRL SZ8.5 (GLOVE) ×3 IMPLANT
GLOVE BIOGEL M 7.0 STRL (GLOVE) ×4 IMPLANT
GLOVE BIOGEL PI IND STRL 7.0 (GLOVE) IMPLANT
GLOVE BIOGEL PI IND STRL 8.5 (GLOVE) ×2 IMPLANT
GLOVE BIOGEL PI INDICATOR 7.0 (GLOVE) ×2
GLOVE BIOGEL PI INDICATOR 8.5 (GLOVE) ×4
GOWN STRL REUS W/TWL 2XL LVL3 (GOWN DISPOSABLE) ×9 IMPLANT
HANDPIECE INTERPULSE COAX TIP (DISPOSABLE) ×3
HEAD BIOLOX HIP 32/+4 (Joint) IMPLANT
HIP BIOLOX HD 32/+4 (Joint) ×3 IMPLANT
HIP MODULAR SYSTEM 21X20XV40 (Hips) ×3 IMPLANT
HOOD PEEL AWAY FLYTE STAYCOOL (MISCELLANEOUS) IMPLANT
JET LAVAGE IRRISEPT WOUND (IRRIGATION / IRRIGATOR) ×3
KIT BASIN OR (CUSTOM PROCEDURE TRAY) ×3 IMPLANT
KIT DRSG PREVENA PLUS 7DAY 125 (MISCELLANEOUS) ×2 IMPLANT
LAVAGE JET IRRISEPT WOUND (IRRIGATION / IRRIGATOR) IMPLANT
NDL 18GX1X1/2 (RX/OR ONLY) (NEEDLE) ×1 IMPLANT
NEEDLE 18GX1X1/2 (RX/OR ONLY) (NEEDLE) ×3 IMPLANT
NS IRRIG 1000ML POUR BTL (IV SOLUTION) ×3 IMPLANT
PACK TOTAL JOINT (CUSTOM PROCEDURE TRAY) ×3 IMPLANT
SET HNDPC FAN SPRY TIP SCT (DISPOSABLE) IMPLANT
SLEEVE CABLE 2MM VT (Orthopedic Implant) ×8 IMPLANT
SPONGE LAP 18X18 RF (DISPOSABLE) ×4 IMPLANT
STEM FEM RESTORATION 15X155 (Stem) ×2 IMPLANT
SUCTION FRAZIER HANDLE 10FR (MISCELLANEOUS) ×3
SUCTION TUBE FRAZIER 10FR DISP (MISCELLANEOUS) ×1 IMPLANT
SUT ETHIBOND 2 V 37 (SUTURE) ×2 IMPLANT
SUT MNCRL AB 4-0 PS2 18 (SUTURE) ×1 IMPLANT
SUT MON AB 2-0 CT1 36 (SUTURE) ×4 IMPLANT
SUT VIC AB 0 CT1 27 (SUTURE) ×6
SUT VIC AB 0 CT1 27XBRD ANBCTR (SUTURE) IMPLANT
SUT VIC AB 1 CT1 27 (SUTURE) ×6
SUT VIC AB 1 CT1 27XBRD ANBCTR (SUTURE) ×1 IMPLANT
SUT VIC AB 2-0 CT1 27 (SUTURE) ×3
SUT VIC AB 2-0 CT1 TAPERPNT 27 (SUTURE) ×1 IMPLANT
SUT VLOC 180 0 24IN GS25 (SUTURE) ×3 IMPLANT
SYR 50ML LL SCALE MARK (SYRINGE) ×3 IMPLANT
SYSTEM MODULAR HIP 21X20XV40 (Hips) IMPLANT
TOWEL GREEN STERILE (TOWEL DISPOSABLE) ×6 IMPLANT
TRAY CATH 16FR W/PLASTIC CATH (SET/KITS/TRAYS/PACK) ×2 IMPLANT
TRAY FOLEY MTR SLVR 16FR STAT (SET/KITS/TRAYS/PACK) IMPLANT

## 2019-07-28 NOTE — Progress Notes (Signed)
Orthopedic Tech Progress Note Patient Details:  [name] 01/24/1946 [id number]  Patient ID: Chinita Greenland, female   DOB: 04/04/1945, 74 y.o.   MRN: [id number]  Brace has been ordered from [facility] E Vanna Sailer 07/28/2019, 11:10 PM

## 2019-07-28 NOTE — H&P (Signed)
TOTAL HIP REVISION ADMISSION H&P  Patient is admitted for right revision total hip arthroplasty.  Subjective:  Chief Complaint: right hip pain  HPI: [name], 74 y.o. female, has a history of pain and functional disability in the right hip due to failed THA and patient has failed non-surgical conservative treatments for greater than 12 weeks to include NSAID's and/or analgesics, supervised PT with diminished ADL's post treatment, use of assistive devices, weight reduction as appropriate and activity modification. The indications for the revision total hip arthroplasty are loosening of one or more components.  Onset of symptoms was gradual starting 3 years ago with gradually worsening course since that time.  Prior procedures on the right hip include arthroplasty.  Patient currently rates pain in the right hip at 10 out of 10 with activity.  There is night pain, worsening of pain with activity and weight bearing, trendelenberg gait, pain that interfers with activities of daily living and pain with passive range of motion. Patient has evidence of prosthetic loosening by imaging studies.  This condition presents safety issues increasing the risk of falls.  This patient has had avascular necrosis of the hip.  There is no current active infection.  Patient Active Problem List   Diagnosis Date Noted  . 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 Acute deep vein thrombosis (DVT) of right lower extremity (HCC) 04/01/2017  . Epistaxis 03/14/2017  . 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 Avascular necrosis of bone of hip, right (HCC) 12/18/2016  . Trochanteric bursitis, right hip 11/04/2016  . Pain of right hip joint 11/04/2016  . S/P lumbar spinal fusion 07/21/2015   Past Medical History:  Diagnosis Date  . Chronic fatigue   . Depression   . Diabetes mellitus without complication (HCC)   . Early cataracts, bilateral   . Fibromyalgia   . Glaucoma   .  Headache    Vestibular migraines  . History of bronchitis   . History of pneumonia   . Hypercholesterolemia   . RA (rheumatoid arthritis) (HCC)   . RA (rheumatoid arthritis) (HCC)   . TIA (transient ischemic attack)   . Vertigo   . Wears glasses     Past Surgical History:  Procedure Laterality Date  . APPENDECTOMY    . BACK SURGERY  2012, 2017  . BILATERAL CARPAL TUNNEL RELEASE    . CESAREAN SECTION     x3  . DILATION AND CURETTAGE OF UTERUS    . GLAUCOMA SURGERY    . KNEE ARTHROSCOPY Left   . MOUTH SURGERY    . TONSILLECTOMY    . TOTAL HIP ARTHROPLASTY Right 01/30/2017   Procedure: RIGHT TOTAL HIP ARTHROPLASTY ANTERIOR APPROACH;  Surgeon: Kathryne Hitch, MD;  Location: [facility];  Service: Orthopedics;  Laterality: Right;    Current Facility-Administered Medications  Medication Dose Route Frequency Provider Last Rate Last Admin  . 0.9 %  sodium chloride infusion   Intravenous Continuous Shiree Altemus, Arlys John, MD      . acetaminophen (OFIRMEV) IV 1,000 mg  1,000 mg Intravenous To OR Willey Due, Arlys John, MD      . clindamycin (CLEOCIN) IVPB 900 mg  900 mg Intravenous On Call to OR Samson Frederic, MD      . lactated ringers infusion   Intravenous Continuous Kipp Brood, MD 10 mL/hr at 07/28/19 1121 New Bag at 07/28/19 1121  . povidone-iodine 10 % swab 2 application  2 application Topical Once Samson Frederic, MD      .  tranexamic acid (CYKLOKAPRON) IVPB 1,000 mg  1,000 mg Intravenous To OR Andriel Omalley, Arlys John, MD      . vancomycin (VANCOREADY) IVPB 1500 mg/300 mL  1,500 mg Intravenous On Call to OR Samson Frederic, MD 150 mL/hr at 07/28/19 1123 1,500 mg at 07/28/19 1123   Allergies  Allergen Reactions  . Penicillins Itching and Swelling    Has patient had a PCN reaction causing immediate rash, facial/tongue/throat swelling, SOB or lightheadedness with hypotension: Yes Has patient had a PCN reaction causing severe rash involving mucus membranes or skin necrosis: No Has patient had a  PCN reaction that required hospitalization No Has patient had a PCN reaction occurring within the last 10 years: No If all of the above answers are "NO", then may proceed with Cephalosporin use.   . Sulfa Antibiotics Other (See Comments)    Don't remember     Social History   Tobacco Use  . Smoking status: Never Smoker  . Smokeless tobacco: Never Used  Substance Use Topics  . Alcohol use: No    Family History  Problem Relation Age of Onset  . Diabetes Mother   . Stroke Mother   . Heart disease Mother   . High blood pressure Father   . Stroke Father   . Diabetes Father   . Heart disease Father   . Heart disease Sister       Review of Systems  Constitutional: Negative.   HENT: Negative.   Eyes: Negative.   Respiratory: Negative.   Cardiovascular: Negative.   Gastrointestinal: Negative.   Endocrine: Negative.   Genitourinary: Negative.   Musculoskeletal: Positive for arthralgias and back pain.  Skin: Negative.   Allergic/Immunologic: Negative.   Neurological: Negative.   Hematological: Negative.   Psychiatric/Behavioral: Negative.     Objective:  Physical Exam  Constitutional: She is oriented to person, place, and time.  HENT:  Head: Normocephalic and atraumatic.  Nose: Nose normal.  Mouth/Throat: Mucous membranes are dry. Oropharynx is clear.  Eyes: Pupils are equal, round, and reactive to light.  Cardiovascular: Normal rate, regular rhythm, normal heart sounds and normal pulses.  Respiratory: Effort normal and breath sounds normal.  GI: Soft. Normal appearance. There is no abdominal tenderness.  Genitourinary:    Genitourinary Comments: deferred   Musculoskeletal:     Cervical back: Normal range of motion and neck supple.       Legs:     Comments: Foot perfused. Paresthesias baseline  Neurological: She is alert and oriented to person, place, and time.  Skin: Skin is warm and dry. Capillary refill takes less than 2 seconds.  Psychiatric: Her behavior is  normal. Mood, judgment and thought content normal.    Vital signs in last 24 hours: Temp:  [98.5 F (36.9 C)] 98.5 F (36.9 C) (06/23 1104) Pulse Rate:  [61] 61 (06/23 1104) Resp:  [18] 18 (06/23 1104) BP: (148)/(56) 148/56 (06/23 1104) SpO2:  [98 %] 98 % (06/23 1104) Weight:  [86.2 kg] 86.2 kg (06/23 1104)   Labs:   Estimated body mass index is 37.11 kg/m as calculated from the following:   Height as of this encounter: 5' (1.524 m).   Weight as of this encounter: 86.2 kg.  Imaging Review:  Plain radiographs demonstrate severe degenerative joint disease of the right hip(s). There is evidence of loosening of the femoral stem.The bone quality appears to be adequate for age and reported activity level. There is loosening of femoral component as seen on xrays and bone scan. Inflammatory markers (-).  Assessment/Plan:  End stage arthritis, right hip(s) with failed previous arthroplasty.  The patient history, physical examination, clinical judgement of the provider and imaging studies are consistent with end stage degenerative joint disease of the right hip(s), previous total hip arthroplasty. Revision total hip arthroplasty is deemed medically necessary. The treatment options including medical management, injection therapy, arthroscopy and arthroplasty were discussed at length. The risks and benefits of total hip arthroplasty were presented and reviewed. The risks due to aseptic loosening, infection, stiffness, dislocation/subluxation,  thromboembolic complications and other imponderables were discussed.  The patient acknowledged the explanation, agreed to proceed with the plan and consent was signed. Patient is being admitted for inpatient treatment for surgery, pain control, PT, OT, prophylactic antibiotics, VTE prophylaxis, progressive ambulation and ADL's and discharge planning. The patient is planning to be discharged home with home health services

## 2019-07-28 NOTE — Transfer of Care (Signed)
Immediate Anesthesia Transfer of Care Note  Patient: [name]  Procedure(s) Performed: TOTAL HIP REVISION, posterior approach femoal comp (Right Hip)  Patient Location: PACU  Anesthesia Type:General  Level of Consciousness: awake, alert  and oriented  Airway & Oxygen Therapy: Patient Spontanous Breathing  Post-op Assessment: Report given to RN and Post -op Vital signs reviewed and stable  Post vital signs: Reviewed and stable  Last Vitals:  Vitals Value Taken Time  BP 126/50 07/28/19 1902  Temp 97.3   Pulse 66 07/28/19 1903  Resp 15 07/28/19 1903  SpO2 98 % 07/28/19 1903  GLU 171  Vitals shown include unvalidated device data.  Last Pain:  Vitals:   07/28/19 1132  TempSrc:   PainSc: 8       Patients Stated Pain Goal: 0 (07/28/19 1132)  Complications: No complications documented.

## 2019-07-28 NOTE — Op Note (Signed)
OPERATIVE REPORT   07/28/2019  6:08 PM  PATIENT:  Carolan Shiver Winger   SURGEON:  Jonette Pesa, MD  ASSISTANT:  Barrie Dunker, PA-C. Skip Mayer, PA-C.   PREOPERATIVE DIAGNOSIS:  Failed right total hip arthroplasty.  POSTOPERATIVE DIAGNOSIS:  Same.  PROCEDURE: Revision right total hip arthroplasty of entire femoral component. Interpretation of fluoroscopic images.  ANESTHESIA:   GETA.  ANTIBIOTICS: 1 g vancomycin. 900 mg clindamycin.  EXPLANTS: DePuy Corail size 12 stem with 32+5 mm Biolox head ball.  IMPLANTS: Stryker restoration modular conical tapered stem size 15 x 155 mm. Restoration modular cone body 21+20 mm. Biolox ceramic femoral head ball 32+4 mm. Dall-Miles 2.0 mm adult reconstruction cable x4.  SPECIMENS: Swab from femoral interface for aerobic and anaerobic culture.  COMPLICATIONS: None.  DISPOSITION: Stable to PACU.  SURGICAL INDICATIONS:  Elea Holtzclaw is a 74 y.o. 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  Preop work-up was negative for infection.  Triple phase bone scan scheduled abnormal uptake around the femoral component.  The risks, benefits, and alternatives were discussed with the patient. There are risks associated with the surgery including, but not limited to, problems with anesthesia (death), infection, instability (giving out of the joint), dislocation, differences in leg length/angulation/rotation, fracture of bones, loosening or failure of implants, hematoma (blood accumulation) which may require surgical drainage, blood clots, pulmonary embolism, nerve injury (foot drop and lateral thigh numbness), and blood vessel injury. The patient understands these risks and elects to proceed.  PROCEDURE IN DETAIL: Patient was identified in holding areas and 2 identifiers.  The surgical site was marked by myself.  She was taken to the operating room, placed supine  on the operating room table.  General anesthesia was induced.  Foley catheter was placed.  She was then flipped left lateral decubitus position.  Axillary roll was placed.  All bony prominences were well-padded.  She was secured to the table with a hip positioner.  The right hip was prepped and draped in the normal sterile surgical fashion.  Timeout was called, verifying site and site of surgery.  Preop antibiotics were given within 60 minutes of beginning the procedure.    Longitudinal incision was made over the proximal femur.  Standard posterior approach was performed.  The IT band was split in line with fibers.  Short external rotators were identified and taken down with the capsule in 1 layer.  The sciatic nerve was palpated and protected throughout the case. The hip was dislocated gently with a bone hook.  The head was removed with a bone tamp.  No corrosion or trunnion damage. Femoral component was grossly stable.  Acetabular retractors were placed, acetabular component was examined. The acetabular component was well fixed.  There is no damage to the poly liner.  The vastus lateralis was identified and released on the posterior septum, in order to reflect the muscle anteriorly.  I planned for 13 cm ETO from the tip of the greater trochanter.  The saw was used to begin the ETO along the posterior cortex of the femur, just anterior to the G max tendon insertion.  The osteotomy was rounded distally.  Soft tissue was cleared from the shoulder of the implant proximally.  Osteotomes were used to create the ETO.  The fragment was created very nicely.  Flexible osteotomes were then used to loosen the implant from the bone from the lateral aspect.  I was then able to remove  the femoral component easily with a loop extractor.  The proximal bone was very sclerotic in nature.  There was no softening or evidence of infection.  Swab from within the femoral canal was obtained and sent for culture.  I then placed a  prophylactic subperiosteal cable about 2 cm distal to the end of the ETO.  I then sequentially reamed up to a 15 mm reamer planning for 155 mm stem.  Reamer depth and position was checked with AP and lateral fluoroscopy views.  I was able to bypassed the ETO by 4 cortices.  The real 15 mm stem was impacted into place.  I reamed up sequentially for a 21 mm body.  The trial body was placed along with a trial head ball.  The hip was reduced.  I checked leg length and stability, and a +4 mm head was required.  Fluoroscopy was used to confirm leg lengths.  The hip was dislocated, and the trial components were removed.  The real cone body was impacted on a place and tightened according to manufacturer's instructions.  The real head ball was placed on the trunnion, and the hip was reduced.  TPS was used to shape the trochanteric fragment to achieve better anatomic fit.  The trochanteric fragment was biased posteriorly and secured with a total of 3 subperiosteal adult reconstruction cables.  The cables were tightened and clipped.  I then perform stability testing of the hip.  In extension and external rotation, there was no dislocation or impingement.  In a position of neutral abduction and 90 degrees of flexion, I was able to internally rotate over 80 degrees before there was any impingement.    The wound was then copiously irrigated with irrisept solution followed by normal saline using pulsatile lavage.  Posterior capsule and external rotators were repaired with #1 Vicryl interrupted sutures.  IT band was closed with #1 Vicryl and #0 V-Loc.  Fatty tissue was reapproximated with 2-0 Vicryl.  Deep dermal layer was reapproximated with 2-0 Monocryl.  Skin was closed with staples.  Prevena incisional negative pressure dressing was applied according to manufacturer's instructions.  Suction was hooked up to 125 mmHg without leak.  Patient was then flipped supine, extubated, and taken to the PACU in stable condition.   Sponge, needle, and instrument counts were correct at the end of the case x2.  There were no known complications.  POSTOPERATIVE PLAN: Patient be admitted to the hospital.  Touchdown weightbearing right lower extremity with walker.  Posterior hip precautions right lower extremity.  Routine postoperative antibiotics.  We will begin Eliquis for DVT prophylaxis.  Abduction brace will be ordered.  Mobilize out of bed with physical therapy.  Plan for discharge home when medically stable.

## 2019-07-28 NOTE — Anesthesia Procedure Notes (Signed)
Procedure Name: Intubation Date/Time: 07/28/2019 2:14 PM Performed by: Marny Lowenstein, CRNA Pre-anesthesia Checklist: Patient identified, Emergency Drugs available, Suction available and Patient being monitored Patient Re-evaluated:Patient Re-evaluated prior to induction Oxygen Delivery Method: Circle system utilized Preoxygenation: Pre-oxygenation with 100% oxygen Induction Type: IV induction Ventilation: Mask ventilation without difficulty and Oral airway inserted - appropriate to patient size Laryngoscope Size: Hyacinth Meeker and 2 Grade View: Grade III Tube size: 7.0 mm Number of attempts: 2 Airway Equipment and Method: Patient positioned with wedge pillow and Bougie stylet Placement Confirmation: ETT inserted through vocal cords under direct vision,  positive ETCO2 and breath sounds checked- equal and bilateral Secured at: 21 cm Tube secured with: Tape Dental Injury: Teeth and Oropharynx as per pre-operative assessment  Difficulty Due To: Difficult Airway- due to anterior larynx

## 2019-07-29 ENCOUNTER — Encounter (HOSPITAL_COMMUNITY): Payer: Self-pay | Admitting: Orthopedic Surgery

## 2019-07-29 LAB — CBC
HCT: 28.1 % — ABNORMAL LOW (ref 36.0–46.0)
Hemoglobin: 8.7 g/dL — ABNORMAL LOW (ref 12.0–15.0)
MCH: 31.1 pg (ref 26.0–34.0)
MCHC: 31 g/dL (ref 30.0–36.0)
MCV: 100.4 fL — ABNORMAL HIGH (ref 80.0–100.0)
Platelets: 239 10*3/uL (ref 150–400)
RBC: 2.8 MIL/uL — ABNORMAL LOW (ref 3.87–5.11)
RDW: 14.1 % (ref 11.5–15.5)
WBC: 9.7 10*3/uL (ref 4.0–10.5)
nRBC: 0 % (ref 0.0–0.2)

## 2019-07-29 LAB — BASIC METABOLIC PANEL
Anion gap: 8 (ref 5–15)
BUN: 9 mg/dL (ref 8–23)
CO2: 23 mmol/L (ref 22–32)
Calcium: 9.2 mg/dL (ref 8.9–10.3)
Chloride: 105 mmol/L (ref 98–111)
Creatinine, Ser: 0.74 mg/dL (ref 0.44–1.00)
GFR calc Af Amer: >60 mL/min (ref >60)
GFR calc non Af Amer: >60 mL/min (ref >60)
Glucose, Bld: 239 mg/dL — ABNORMAL HIGH (ref 70–99)
Potassium: 4.7 mmol/L (ref 3.5–5.1)
Sodium: 136 mmol/L (ref 135–145)

## 2019-07-29 LAB — GLUCOSE, CAPILLARY
Glucose-Capillary: 184 mg/dL — ABNORMAL HIGH (ref 70–99)
Glucose-Capillary: 150 mg/dL — ABNORMAL HIGH (ref 70–99)

## 2019-07-29 MED ORDER — INSULIN ASPART 100 UNIT/ML ~~LOC~~ SOLN: 0.0000 [IU] | Freq: Every day | SUBCUTANEOUS | Status: DC

## 2019-07-29 MED ORDER — INSULIN ASPART 100 UNIT/ML ~~LOC~~ SOLN
0.0000 [IU] | Freq: Three times a day (TID) | SUBCUTANEOUS | Status: DC
  Administered 2019-07-29: 2 [IU] via SUBCUTANEOUS
  Administered 2019-07-31 – 2019-08-03 (×6): 1 [IU] via SUBCUTANEOUS
  Administered 2019-08-03: 2 [IU] via SUBCUTANEOUS

## 2019-07-29 NOTE — NC FL2 (Signed)
[place] LEVEL OF CARE SCREENING TOOL     IDENTIFICATION  Patient Name: [name] Birthdate: May 27, 1945 Sex: female Admission Date (Current Location): 07/28/2019  [facility] and [place] Number:  Herbalist and Address:  [facility], [place] [address], [place], [place] [address]      Provider Number: [id number]  Attending Physician Name and Address:  Rod Can, MD  Relative Name and Phone Number:       Current Level of Care: Hospital Recommended Level of Care: [place] Prior Approval Number:    Date Approved/Denied:   PASRR Number: [id number]  Discharge Plan: SNF    Current Diagnoses: Patient Active Problem List   Diagnosis Date Noted  . Failed total hip arthroplasty ([place]) 07/28/2019  . Femoral loosening of prosthetic right hip ([place]) 01/13/2019  . Acute deep vein thrombosis (DVT) of right lower extremity ([place]) 04/01/2017  . Epistaxis 03/14/2017  . 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 Avascular necrosis of bone of hip, right ([place]) 12/18/2016  . Trochanteric bursitis, right hip 11/04/2016  . Pain of right hip joint 11/04/2016  . S/P lumbar spinal fusion 07/21/2015    Orientation RESPIRATION BLADDER Height & Weight     Self, Time, Situation, Place  Normal Continent Weight: 190 lb (86.2 kg) Height:  5' (152.4 cm)  BEHAVIORAL SYMPTOMS/MOOD NEUROLOGICAL BOWEL NUTRITION STATUS      Continent Diet (see discharge summary)  AMBULATORY STATUS COMMUNICATION OF NEEDS Skin   Extensive Assist Verbally Surgical wounds, Wound Vac (closed incision on R flank; pt with d/c with prevena wound vac)                       Personal Care Assistance Level of Assistance  Dressing, Feeding, Bathing Bathing Assistance: Maximum assistance Feeding assistance: Independent Dressing Assistance: Maximum assistance     Functional Limitations Info  Sight, Hearing, Speech Sight Info:  Adequate Hearing Info: Adequate Speech Info: Adequate    SPECIAL CARE FACTORS FREQUENCY  OT (By licensed OT), PT (By licensed PT)     PT Frequency: 5x week OT Frequency: 5x week            Contractures Contractures Info: Not present    Additional Factors Info  Code Status, Allergies, Insulin Sliding Scale, Psychotropic Code Status Info: Full Code Allergies Info: Penicillins, Sulfa Antibiotics Psychotropic Info: DULoxetine (CYMBALTA) DR capsule 60 mg daily PO Insulin Sliding Scale Info: insulin aspart (novoLOG) injection 0-9 Units 3x daily with meals; insulin aspart (novoLOG) injection 0-5 Units daily at bedtime       Current Medications (07/29/2019):  This is the current hospital active medication list Current Facility-Administered Medications  Medication Dose Route Frequency Provider Last Rate Last Admin  . 0.9 %  sodium chloride infusion   Intravenous Continuous Rod Can, MD 150 mL/hr at 07/29/19 1127 New Bag at 07/29/19 1127  . acetaminophen (TYLENOL) tablet 325-650 mg  325-650 mg Oral Q6H PRN Swinteck, Aaron Edelman, MD      . alum & mag hydroxide-simeth (MAALOX/MYLANTA) 200-200-20 MG/5ML suspension 30 mL  30 mL Oral Q4H PRN Swinteck, Aaron Edelman, MD      . apixaban Arne Cleveland) tablet 2.5 mg  2.5 mg Oral Q12H Rod Can, MD   2.5 mg at 07/29/19 0835  . cholecalciferol (VITAMIN D3) tablet 2,000 Units  2,000 Units Oral Daily Rod Can, MD   2,000 Units at 07/29/19 1056  . diphenhydrAMINE (BENADRYL) 12.5 MG/5ML elixir 12.5-25  mg  12.5-25 mg Oral Q4H PRN Swinteck, Arlys John, MD      . docusate sodium (COLACE) capsule 100 mg  100 mg Oral BID Samson Frederic, MD   100 mg at 07/29/19 1057  . DULoxetine (CYMBALTA) DR capsule 60 mg  60 mg Oral Daily Samson Frederic, MD   60 mg at 07/29/19 1057  . ezetimibe (ZETIA) tablet 10 mg  10 mg Oral Daily Samson Frederic, MD   10 mg at 07/29/19 1057  . fenofibrate tablet 54 mg  54 mg Oral Daily Samson Frederic, MD   54 mg at 07/29/19 1059  .  folic acid (FOLVITE) tablet 1 mg  1 mg Oral Daily Swinteck, Arlys John, MD   1 mg at 07/29/19 1057  . gabapentin (NEURONTIN) capsule 300 mg  300 mg Oral QHS Samson Frederic, MD   300 mg at 07/28/19 2205  . HYDROmorphone (DILAUDID) injection 0.5-1 mg  0.5-1 mg Intravenous Q4H PRN Swinteck, Arlys John, MD      . hydrOXYzine (ATARAX/VISTARIL) tablet 25 mg  25 mg Oral Daily Samson Frederic, MD   25 mg at 07/29/19 1057  . insulin aspart (novoLOG) injection 0-5 Units  0-5 Units Subcutaneous QHS Swinteck, Arlys John, MD      . insulin aspart (novoLOG) injection 0-9 Units  0-9 Units Subcutaneous TID WC Swinteck, Arlys John, MD      . latanoprost (XALATAN) 0.005 % ophthalmic solution 1 drop  1 drop Both Eyes QHS Samson Frederic, MD   1 drop at 07/29/19 0006  . loratadine (CLARITIN) tablet 10 mg  10 mg Oral Daily Samson Frederic, MD   10 mg at 07/29/19 1100  . menthol-cetylpyridinium (CEPACOL) lozenge 3 mg  1 lozenge Oral PRN Swinteck, Arlys John, MD       Or  . phenol (CHLORASEPTIC) mouth spray 1 spray  1 spray Mouth/Throat PRN Swinteck, Arlys John, MD      . metoCLOPramide (REGLAN) tablet 5-10 mg  5-10 mg Oral Q8H PRN Swinteck, Arlys John, MD       Or  . metoCLOPramide (REGLAN) injection 5-10 mg  5-10 mg Intravenous Q8H PRN Swinteck, Arlys John, MD      . ondansetron (ZOFRAN) tablet 4 mg  4 mg Oral Q6H PRN Swinteck, Arlys John, MD       Or  . ondansetron (ZOFRAN) injection 4 mg  4 mg Intravenous Q6H PRN Swinteck, Arlys John, MD      . oxyCODONE (Oxy IR/ROXICODONE) immediate release tablet 10-15 mg  10-15 mg Oral Q4H PRN Samson Frederic, MD   10 mg at 07/29/19 0835  . oxyCODONE (Oxy IR/ROXICODONE) immediate release tablet 5-10 mg  5-10 mg Oral Q4H PRN Swinteck, Arlys John, MD      . pioglitazone (ACTOS) tablet 30 mg  30 mg Oral QAC breakfast Samson Frederic, MD   30 mg at 07/29/19 0800  . polyethylene glycol (MIRALAX / GLYCOLAX) packet 17 g  17 g Oral Daily PRN Swinteck, Arlys John, MD      . senna (SENOKOT) tablet 8.6 mg  1 tablet Oral BID Samson Frederic, MD    8.6 mg at 07/29/19 1100  . SUMAtriptan (IMITREX) tablet 100 mg  100 mg Oral Once PRN Samson Frederic, MD      . topiramate (TOPAMAX) tablet 50 mg  50 mg Oral QHS Samson Frederic, MD   50 mg at 07/29/19 0007     Discharge Medications: Please see discharge summary for a list of discharge medications.  Relevant Imaging Results:  Relevant Lab Results:   Additional Information SS#[id number]  Doy Hutching, LCSW

## 2019-07-29 NOTE — TOC Benefit Eligibility Note (Signed)
Transition of Care [facility]) Benefit Eligibility Note    Patient Details  Name: [name] MRN: [id number] Date of Birth: 10-Jun-1945   Medication/Dose: Eliquis 5mg  bid  Covered?: Yes     Prescription Coverage Preferred Pharmacy: [facility]  Spoke with Person/Company/Phone Number:: [employer]  Co-Pay: $80 for 90 day mail order/ $40 for 30 day retail  Prior Approval: No          [phone] Phone Number: 07/29/2019, 12:44 PM

## 2019-07-29 NOTE — Evaluation (Addendum)
Occupational Therapy Evaluation Patient Details Name: [name] MRN: [id number] DOB: [birth year] Today's Date: 07/29/2019    History of Present Illness Pt is a 74 y.o. 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 PMH includes TIA, RA, HA, DM II, R THA (2018).   Clinical Impression   PTA pt living alone, independent for BADL/IADL. At time of eval, pt presents with increased difficulty completing BADL and mobility at desired level of independence. Pt completed bed mobility with mod A +2 and sit <> stands with max A +2 and RW. Pt is greatly limited by pain with mobility. She was able to tolerate standing ~30 seconds- not able to advance to mobility this session. Noted poor follow through of RLE WB precautions throughout session despite verbal and tactile cueing. Pt in hip abduction brace, noted areas of skin integrity concern at ASIS, R thigh, R groin. Adjusted brace and applied some padding with pillow cases. At this time, recommend SNF placement to increase safe mobility and BADL engagement prior to d/c home alone. Will continue to follow per POC Listed below.     Follow Up Recommendations  SNF    Equipment Recommendations  3 in 1 bedside commode;Wheelchair (measurements OT);Wheelchair cushion (measurements OT);Hospital bed    Recommendations for Other Services       Precautions / Restrictions Precautions Precautions: Posterior Hip;Fall Precaution Comments: reviewed throughout Required Braces or Orthoses: Other Brace Other Brace: R hip abduction brace Restrictions Weight Bearing Restrictions: Yes RLE Weight Bearing: Touchdown weight bearing      Mobility Bed Mobility Overal bed mobility: Needs Assistance Bed Mobility: Supine to Sit;Sit to Supine     Supine to sit: Mod assist;+2 for physical assistance;HOB elevated Sit to supine: Max assist;+2 for physical assistance   General bed mobility comments: Cues for  sequencing, pt able to assist with LLE and BUE for trunk elevation; modA+2 to assist trunk elevation, pt using LLE to assist hips to EOB.  Transfers Overall transfer level: Needs assistance Equipment used: Rolling walker (2 wheeled) Transfers: Sit to/from Stand Sit to Stand: +2 physical assistance;From elevated surface;Max assist Stand pivot transfers: +2 physical assistance;+2 safety/equipment;Max assist       General transfer comment: ModA+2 for trunk elevation standing  from elevated EOB to RW; multimodal cues for RLE TDWB precautions but pt with significant dificulty maintaining. Unable to hop on L foot, able to minimally scoot laterally towards HOB, but ultimately unable to maintain WB precautions while standing despite maxA    Balance Overall balance assessment: Needs assistance Sitting-balance support: Bilateral upper extremity supported;Feet supported Sitting balance-Leahy Scale: Fair Sitting balance - Comments: Prolonged sitting EOB for RLE AROM/PROM and brace readjustment, tolerated well with 1-2 UE support progressing to supervision-level     Standing balance-Leahy Scale: Poor Standing balance comment: Reliant on UE support and external assist; difficulty maintaining RLE TDWB precautions                           ADL either performed or assessed with clinical judgement   ADL Overall ADL's : Needs assistance/impaired Eating/Feeding: Set up;Bed level   Grooming: Set up;Bed level   Upper Body Bathing: Minimal assistance;Sitting   Lower Body Bathing: Total assistance;Sitting/lateral leans;Sit to/from stand   Upper Body Dressing : Minimal assistance;Sitting   Lower Body Dressing: Total assistance;Sitting/lateral leans;Sit to/from stand   Toilet Transfer: Maximal assistance;+2 for physical assistance;+2 for safety/equipment;Squat-pivot;BSC;RW Toilet Transfer Details (indicate cue  type and reason): difficulty maintaining PWB Toileting- Clothing Manipulation and  Hygiene: Maximal assistance;Sitting/lateral lean;Sit to/from stand       Functional mobility during ADLs: Maximal assistance;+2 for physical assistance;+2 for safety/equipment;Rolling walker       Vision Baseline Vision/History: Wears glasses Wears Glasses: At all times Patient Visual Report: No change from baseline       Perception     Praxis      Pertinent Vitals/Pain Pain Assessment: Faces Faces Pain Scale: Hurts whole lot Pain Location: RLE Pain Descriptors / Indicators: Discomfort;Dull;Grimacing;Guarding Pain Intervention(s): Monitored during session;Repositioned;Patient requesting pain meds-RN notified     Hand Dominance     Extremity/Trunk Assessment Upper Extremity Assessment Upper Extremity Assessment: Generalized weakness   Lower Extremity Assessment Lower Extremity Assessment: Defer to PT evaluation       Communication Communication Communication: No difficulties   Cognition Arousal/Alertness: Awake/alert Behavior During Therapy: WFL for tasks assessed/performed Overall Cognitive Status: No family/caregiver present to determine baseline cognitive functioning Area of Impairment: Attention;Safety/judgement                   Current Attention Level: Selective     Safety/Judgement: Decreased awareness of safety;Decreased awareness of deficits     General Comments: internally distracted by pain; overall decreased awareness of deficits and safety   General Comments  Adjusted R hip abduction brace- noted areas of skin integrity concern at back, side, thigh, and groin. Helped pt place padding with pillow case for pressure relief    Exercises    Shoulder Instructions      Home Living Family/patient expects to be discharged to:: Private residence Living Arrangements: Alone Available Help at Discharge: Available PRN/intermittently Type of Home: House Home Access: Stairs to enter [employer] of Steps: 3 Entrance Stairs-Rails:  Right Home Layout: Two level Alternate Level Stairs-Number of Steps: flight Alternate Level Stairs-Rails: Right     Bathroom Toilet: Handicapped height     Home Equipment: Walker - 2 wheels;Bedside commode;Wheelchair - manual;Grab bars - toilet          Prior Functioning/Environment Level of Independence: Independent                 OT Problem List: Decreased strength;Decreased knowledge of use of DME or AE;Decreased knowledge of precautions;Decreased activity tolerance;Impaired balance (sitting and/or standing);Decreased safety awareness;Pain      OT Treatment/Interventions: Self-care/ADL training;Therapeutic exercise;Patient/family education;Balance training;Energy conservation;Therapeutic activities;DME and/or AE instruction    OT Goals(Current goals can be found in the care plan section) Acute Rehab OT Goals Patient Stated Goal: return to independence OT Goal Formulation: With patient Time For Goal Achievement: 08/12/19 Potential to Achieve Goals: Good  OT Frequency: Min 2X/week   Barriers to D/C:    lives alone in two story home       Co-evaluation PT/OT/SLP Co-Evaluation/Treatment: Yes Reason for Co-Treatment: For patient/therapist safety;To address functional/ADL transfers PT goals addressed during session: Mobility/safety with mobility;Balance OT goals addressed during session: ADL's and self-care;Proper use of Adaptive equipment and DME;Strengthening/ROM      AM-PAC OT "6 Clicks" Daily Activity     Outcome Measure Help from another person eating meals?: None Help from another person taking care of personal grooming?: A Little Help from another person toileting, which includes using toliet, bedpan, or urinal?: A Lot Help from another person bathing (including washing, rinsing, drying)?: A Lot Help from another person to put on and taking off regular upper body clothing?: A Little Help from another person to put on and taking  off regular lower body  clothing?: Total 6 Click Score: 15   End of Session Equipment Utilized During Treatment: Gait belt;Rolling walker Nurse Communication: Mobility status;Precautions;Weight bearing status  Activity Tolerance: Patient tolerated treatment well Patient left: in bed;with call bell/phone within reach;with bed alarm set  OT Visit Diagnosis: Unsteadiness on feet (R26.81);Other abnormalities of gait and mobility (R26.89);Muscle weakness (generalized) (M62.81);Pain Pain - Right/Left: Right Pain - part of body: Hip;Leg                Time: 0102-7253 OT Time Calculation (min): 28 min Charges:  OT General Charges $OT Visit: 1 Visit OT Evaluation $OT Eval Moderate Complexity: 1 Mod  Dalphine Handing, MSOT, OTR/L Acute Rehabilitation Services [facility] Central Office Number: [phone] Pager: [phone]  Dalphine Handing 07/29/2019, 6:24 PM

## 2019-07-29 NOTE — TOC Initial Note (Addendum)
Transition of Care [facility]) - Initial/Assessment Note    Patient Details  Name: [name] MRN: [id number] Date of Birth: 10/05/1945  Transition of Care [facility]) CM/SW Contact:    Lockie Pares, RN Phone Number: 07/29/2019, 10:42 AM  Clinical Narrative:                  Patient admitted with a failed Right hip replacement,  Attempted another right hip replacement. Now has a brace and wound vac.  Discussed Home health v Rehabilitation, pneding PT and OT note. Marland Kitchen Has had HH [facility] in the past and has been to [facility]. Liked both, [facility] had great rehab, ok staff. Gave choices of Home Health for now.  Leonette Most For home health. Will let them know closer to Discharge, when PT evaluation clear. . Patient placed on eliquis. Eligibility done for this medication. Will speak to patient about co-pay when this returns.  Expected Discharge Plan: Home w Home Health Services Barriers to Discharge: Continued Medical Work up   Patient Goals and CMS Choice     Choice offered to / list presented to : Patient  Expected Discharge Plan and Services Expected Discharge Plan: Home w Home Health Services   Discharge Planning Services: CM Consult                                          Prior Living Arrangements/Services     Patient language and need for interpreter reviewed:: Yes        Need for Family Participation in Patient Care: Yes (Comment)        Activities of Daily Living      Permission Sought/Granted  To talk to [facility]                Emotional Assessment       Orientation: : Oriented to Self, Oriented to Place, Oriented to  Time, Oriented to Situation   Psych Involvement: No (comment)  Admission diagnosis:  Failed total hip arthroplasty (HCC) [T90.300P, Z96.649] Patient Active Problem List   Diagnosis Date Noted  . 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 Acute deep  vein thrombosis (DVT) of right lower extremity (HCC) 04/01/2017  . Epistaxis 03/14/2017  . 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 Avascular necrosis of bone of hip, right (HCC) 12/18/2016  . Trochanteric bursitis, right hip 11/04/2016  . Pain of right hip joint 11/04/2016  . S/P lumbar spinal fusion 07/21/2015   PCP:  Juluis Rainier, MD Pharmacy:   [facility] [phone] - [address], [place] - [address] [place] [address] Phone: [phone] Fax: [phone]     Social Determinants of Health (SDOH) Interventions    Readmission Risk Interventions No flowsheet data found.

## 2019-07-29 NOTE — Progress Notes (Signed)
Subjective:  Patient reports pain as moderate to severe.  Denies N/V/CP/SOB. C/o LBP. Received abduction brace this am  Objective:   VITALS:   Vitals:   07/28/19 2221 07/29/19 0217 07/29/19 0522 07/29/19 0855  BP: (!) 139/57 (!) 132/55 (!) 114/48 106/75  Pulse: 70 60 (!) 57 60  Resp: 17 17 16 18   Temp: (!) 97.5 F (36.4 C) (!) 97.4 F (36.3 C) (!) 97.5 F (36.4 C) 98.3 F (36.8 C)  TempSrc: Oral   Oral  SpO2: 92% 98% 95% 94%  Weight:      Height:        NAD ABD soft Sensation intact distally Intact pulses distally Dorsiflexion/Plantar flexion intact Incision: dressing C/D/I Compartment soft VAC intact with good seal Abduction brace in place  Lab Results  Component Value Date   WBC 9.7 07/29/2019   HGB 8.7 (L) 07/29/2019   HCT 28.1 (L) 07/29/2019   MCV 100.4 (H) 07/29/2019   PLT 239 07/29/2019   BMET    Component Value Date/Time   NA 136 07/29/2019 0251   K 4.7 07/29/2019 0251   CL 105 07/29/2019 0251   CO2 23 07/29/2019 0251   GLUCOSE 239 (H) 07/29/2019 0251   BUN 9 07/29/2019 0251   CREATININE 0.74 07/29/2019 0251   CALCIUM 9.2 07/29/2019 0251   GFRNONAA >60 07/29/2019 0251   GFRAA >60 07/29/2019 0251   Recent Results (from the past 240 hour(s))  Surgical pcr screen     Status: None   Collection Time: 07/20/19 12:35 PM   Specimen: Nasal Mucosa; Nasal Swab  Result Value Ref Range Status   MRSA, PCR NEGATIVE NEGATIVE Final   Staphylococcus aureus NEGATIVE NEGATIVE Final    Comment: (NOTE) The Xpert SA Assay (FDA approved for NASAL specimens in patients 58 years of age and older), is one component of a comprehensive surveillance program. It is not intended to diagnose infection nor to guide or monitor treatment. Performed at [facility], [place] [address], [place], [place] [address]   SARS CORONAVIRUS 2 (TAT 6-24 HRS) Nasopharyngeal Nasopharyngeal Swab     Status: None   Collection Time: 07/24/19 10:52 AM   Specimen: Nasopharyngeal Swab    Result Value Ref Range Status   SARS Coronavirus 2 NEGATIVE NEGATIVE Final    Comment: (NOTE) SARS-CoV-2 target nucleic acids are NOT DETECTED.  The SARS-CoV-2 RNA is generally detectable in upper and lower respiratory specimens during the acute phase of infection. Negative results do not preclude SARS-CoV-2 infection, do not rule out co-infections with other pathogens, and should not be used as the sole basis for treatment or other patient management decisions. Negative results must be combined with clinical observations, patient history, and epidemiological information. The expected result is Negative.  Fact Sheet for Patients: SugarRoll.be  Fact Sheet for Healthcare Providers: https://www.woods-mathews.com/  This test is not yet approved or cleared by the [place] FDA and  has been authorized for detection and/or diagnosis of SARS-CoV-2 by FDA under an Emergency Use Authorization (EUA). This EUA will remain  in effect (meaning this test can be used) for the duration of the COVID-19 declaration under Se ction 564(b)(1) of the Act, 21 U.S.C. section 360bbb-3(b)(1), unless the authorization is terminated or revoked sooner.  Performed at [facility], [place] [address], [place],  [address]   Aerobic/Anaerobic Culture (surgical/deep wound)     Status: None (Preliminary result)   Collection Time: 07/28/19  5:01 PM   Specimen: PATH Other;  Body Fluid  Result Value Ref Range Status   Specimen Description HIP  Final   Special Requests RIGHT FEMORAL INTERFACE  Final   Gram Stain   Final    FEW WBC PRESENT,BOTH PMN AND MONONUCLEAR NO ORGANISMS SEEN    Culture   Final    NO GROWTH < 24 HOURS Performed at [facility], [address], [place], [place] [address]    Report Status PENDING  Incomplete    Assessment/Plan: 1 Day Post-Op   Principal Problem:   Failed total hip arthroplasty (HCC)   TDWB RLE with  walker Posterior hip precautions Abduction brace at all times except for hygiene DVT ppx: apixaban, SCDs, TEDS PO pain control PT/OT DM2: restart home meds, sliding scale PRN ABLA: asymptomatic, recheck in am Dispo: d/c home with HHPT, will need WC and likely hospital bed, anticipate d/c over the weekend, convert house VAC to portable prevena unit upon d/c   Jonette Pesa 07/29/2019, 12:25 PM   Samson Frederic, MD [phone] [facility] is now [facility]  [place] [address], [address], [place], [place] [address] Phone: [phone] [email or web address] Facebook  [employer]

## 2019-07-29 NOTE — Evaluation (Signed)
Physical Therapy Evaluation Patient Details Name: [name] MRN: [id number] DOB: [birth year] Today's Date: 07/29/2019   History of Present Illness  Pt 74 y.o. femlale admitted 07/28/19 for R total hip replacement revision of femoral componet. PMH TIA, RA, HA, DM II, THA 2018.  Clinical Impression  Pt presents with an overall decrease in functional mobility, decrease in balance, generalized weakness and increase in pain secondary to above. PTA, pt lives alone at home. Educ on TTWB and hip precautions, using verbal, visual and tactile reenforcement multiple times throughout the session. Today, pt able to complete bed mobility, transfers and minimal amb mod(A)+2 for safety. Pt is a high fall risk and discussed discharge recommendations for rehab, for safety and increased therapy prior to going home. Pt hesitant but open to communicating with case manager, pt reported frequently throughout session "I don't know how I am going to do this at home".  Pt would benefit from continued acute PT services to maximize functional mobility and independence prior to d/c to next venue of care.     Follow Up Recommendations SNF    Equipment Recommendations  Other (comment) (tbd)    Recommendations for Other Services       Precautions / Restrictions Precautions Precautions: Posterior Hip Required Braces or Orthoses: Other Brace Other Brace: hip brace Restrictions Weight Bearing Restrictions: Yes RLE Weight Bearing: Touchdown weight bearing      Mobility  Bed Mobility Overal bed mobility: Needs Assistance Bed Mobility: Supine to Sit     Supine to sit: Mod assist;+2 for physical assistance     General bed mobility comments: Pt requires mod(A)+2 for LE support and trunk elevation to sit EOB. Pt required consistent min guard to min(A) to maintain upright position in sitting.  Transfers Overall transfer level: Needs assistance Equipment used: Rolling walker (2 wheeled) Transfers: Sit  to/from [facility] Sit to Stand: Mod assist;+2 physical assistance;+2 safety/equipment Stand pivot transfers: Mod assist;+2 physical assistance;+2 safety/equipment       General transfer comment: Pt mod(A)+2 for physical assistance and transfers, pt able to maintain TTWB with consistant verbal and tactile cues. Pt able to transfer to bedside commode and to recliner mod(A)+2 maintaining posterior hip precautions and TTWB.  Ambulation/Gait Ambulation/Gait assistance: Mod assist;+2 physical assistance;+2 safety/equipment Gait Distance (Feet): 2 Feet Assistive device: Rolling walker (2 wheeled)       General Gait Details: Pt able to scoot LLE to side and move 1-2' from bedside commode to recliner maintaining hip and TTWB precautions during session.  Stairs            Wheelchair Mobility    Modified Rankin (Stroke Patients Only)       Balance Overall balance assessment: Needs assistance Sitting-balance support: Bilateral upper extremity supported;Feet supported Sitting balance-Leahy Scale: Fair Sitting balance - Comments: Pt required constant tactile, verbal cues and assistance to maintain balance sitting EOB, pt required max(A) to scoot hips EOB.     Standing balance-Leahy Scale: Poor Standing balance comment: Pt required RW and mod(A) +2 to maintain balance in stance, while maintaining hip precautions and TTWB precautions                             Pertinent Vitals/Pain Pain Assessment: 0-10 Pain Score: 10-Worst pain ever Pain Location: prior to pain medication being given before session, end of session pt reported feeling comfortable in chair Pain Descriptors / Indicators: Discomfort;Dull;Grimacing;Guarding Pain Intervention(s): Limited activity within patient's tolerance;Monitored during  session;Premedicated before session;Repositioned    Home Living Family/patient expects to be discharged to:: Private residence Living Arrangements:  Alone Available Help at Discharge: Available PRN/intermittently Type of Home: House Home Access: Stairs to enter Entrance Stairs-Rails: Right Entrance Stairs-Number of Steps: 3 Home Layout: Two level Home Equipment: Walker - 2 wheels;Bedside commode;Wheelchair - manual;Grab bars - toilet      Prior Function Level of Independence: Independent               Hand Dominance        Extremity/Trunk Assessment   Upper Extremity Assessment Upper Extremity Assessment: Defer to OT evaluation    Lower Extremity Assessment Lower Extremity Assessment: RLE deficits/detail;LLE deficits/detail RLE Deficits / Details: unable to assess due to brace, bandages appeared WNL pre and post session RLE: Unable to fully assess due to pain LLE Deficits / Details: WNL able to support RLE when amb, reports that L LE "buckles randomly".       Communication   Communication: No difficulties  Cognition Arousal/Alertness: Awake/alert Behavior During Therapy: WFL for tasks assessed/performed Overall Cognitive Status: Difficult to assess Area of Impairment: Safety/judgement                         Safety/Judgement: Decreased awareness of safety;Decreased awareness of deficits     General Comments: Difficulty to assess due pain meds, crying consistently due to emotions about not feeling "strong", pt reports she has good and bad days since her husband died.      General Comments General comments (skin integrity, edema, etc.): Pt focused on getting home, educated pt on the importance of going to rehab prior for safety. Pt bandage appeared WNL. Pt maintained posterior hip precautions and TTWB during session, brace on during session.    Exercises     Assessment/Plan    PT Assessment Patient needs continued PT services  PT Problem List Decreased strength;Decreased mobility;Decreased safety awareness;Decreased range of motion;Decreased coordination;Decreased knowledge of  precautions;Decreased activity tolerance;Obesity;Decreased balance;Decreased knowledge of use of DME;Pain       PT Treatment Interventions DME instruction;Therapeutic activities;Gait training;Cognitive remediation;Therapeutic exercise;Patient/family education;Stair training;Balance training;Functional mobility training;Neuromuscular re-education    PT Goals (Current goals can be found in the Care Plan section)  Acute Rehab PT Goals Patient Stated Goal: to go home PT Goal Formulation: With patient Time For Goal Achievement: 08/12/19 Potential to Achieve Goals: Good    Frequency Min 3X/week   Barriers to discharge Inaccessible home environment;Decreased caregiver support Pt reports she will have support for first few weeks    Co-evaluation               AM-PAC PT "6 Clicks" Mobility  Outcome Measure Help needed turning from your back to your side while in a flat bed without using bedrails?: A Lot Help needed moving from lying on your back to sitting on the side of a flat bed without using bedrails?: A Lot Help needed moving to and from a bed to a chair (including a wheelchair)?: A Lot Help needed standing up from a chair using your arms (e.g., wheelchair or bedside chair)?: A Lot Help needed to walk in hospital room?: Total Help needed climbing 3-5 steps with a railing? : Total 6 Click Score: 10    End of Session Equipment Utilized During Treatment: Gait belt Activity Tolerance: Patient tolerated treatment well;Other (comment) (safe mobility with maintaing WB precautions) Patient left: in chair;with call bell/phone within reach;with chair alarm set Nurse Communication: Mobility status  PT Visit Diagnosis: Unsteadiness on feet (R26.81);Other abnormalities of gait and mobility (R26.89);Difficulty in walking, not elsewhere classified (R26.2);Pain Pain - Right/Left: Right Pain - part of body: Hip    Time: 3491-7915 PT Time Calculation (min) (ACUTE ONLY): 43 min   Charges:    PT Evaluation $PT Eval Moderate Complexity: 1 Mod PT Treatments $Therapeutic Activity: 23-37 mins        [employer] SPT 07/29/2019   Sanjuana Letters 07/29/2019, 11:50 AM

## 2019-07-29 NOTE — Progress Notes (Signed)
Physical Therapy Treatment Patient Details Name: [name] MRN: [id number] DOB: 1945/03/01 Today's Date: 07/29/2019    History of Present Illness Pt is a 74 y.o. 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 PMH includes TIA, RA, HA, DM II, R THA (2018).   PT Comments    Pt seen for additional session, focusing on standing transfers, seated balance and brace adjustment for pressure relief/skin integrity. Pt requires modA+2 to stand from elevated surface, unable to maintain RLE TDWB with standing activity and unable to hop on LLE; at high risk for falls. Motivated to participate and regain PLOF. Continue to recommend SNF-level therapies to maximize functional mobility and independence prior to return home; pt in agreement.     Follow Up Recommendations  SNF;Supervision/Assistance - 24 hour     Equipment Recommendations   (defer)    Recommendations for Other Services       Precautions / Restrictions Precautions Precautions: Posterior Hip;Fall Required Braces or Orthoses: Other Brace Other Brace: R hip abduction brace Restrictions Weight Bearing Restrictions: Yes RLE Weight Bearing: Touchdown weight bearing    Mobility  Bed Mobility Overal bed mobility: Needs Assistance Bed Mobility: Supine to Sit;Sit to Supine     Supine to sit: Mod assist;+2 for physical assistance;HOB elevated Sit to supine: Max assist;+2 for physical assistance   General bed mobility comments: Cues for sequencing, pt able to assist with LLE and BUE for trunk elevation; modA+2 to assist trunk elevation, pt using LLE to assist hips to EOB.  Transfers Overall transfer level: Needs assistance Equipment used: Rolling walker (2 wheeled) Transfers: Sit to/from Stand Sit to Stand: Mod assist;+2 physical assistance;From elevated surface         General transfer comment: ModA+2 for trunk elevation standing  from elevated EOB to RW;  multimodal cues for RLE TDWB precautions but pt with significant dificulty maintaining. Unable to hop on L foot, able to minimally scoot laterally towards HOB, but ultimately unable to maintain WB precautions while standing despite maxA  Ambulation/Gait                 Stairs             Wheelchair Mobility    Modified Rankin (Stroke Patients Only)       Balance Overall balance assessment: Needs assistance   Sitting balance-Leahy Scale: Fair Sitting balance - Comments: Prolonged sitting EOB for RLE AROM/PROM and brace readjustment, tolerated well with 1-2 UE support progressing to supervision-level     Standing balance-Leahy Scale: Poor Standing balance comment: Reliant on UE support and external assist; difficulty maintaining RLE TDWB precautions                            Cognition Arousal/Alertness: Awake/alert Behavior During Therapy: WFL for tasks assessed/performed Overall Cognitive Status: No family/caregiver present to determine baseline cognitive functioning Area of Impairment: Attention;Safety/judgement                   Current Attention Level: Selective     Safety/Judgement: Decreased awareness of safety;Decreased awareness of deficits            Exercises Other Exercises Other Exercises: AAROM RLE seated LAQ, knee flexion; AAROM LLE supine hip flex (knee to chest) + LAQ    General Comments General comments (skin integrity, edema, etc.): Adjusted R hip abduction brace and checked skin integrity, brace adjusted accordingly to reduce risk  for pressure injuries; noted pressure points on bilateral ASIS/lower back, R medial thigh near groin, R anterior thigh and R lateral thigh where wound vac line was against skin, attempted to adjust brace accordingly and place padding for pressure relief      Pertinent Vitals/Pain Pain Assessment: Faces Faces Pain Scale: Hurts even more Pain Location: RLE Pain Descriptors / Indicators:  Discomfort;Dull;Grimacing;Guarding Pain Intervention(s): Monitored during session;Patient requesting pain meds-RN notified;Repositioned    Home Living                      Prior Function            PT Goals (current goals can now be found in the care plan section) Progress towards PT goals: Progressing toward goals    Frequency    Min 3X/week      PT Plan Current plan remains appropriate    Co-evaluation PT/OT/SLP Co-Evaluation/Treatment: Yes Reason for Co-Treatment: For patient/therapist safety;To address functional/ADL transfers PT goals addressed during session: Mobility/safety with mobility;Balance        AM-PAC PT "6 Clicks" Mobility   Outcome Measure  Help needed turning from your back to your side while in a flat bed without using bedrails?: A Lot Help needed moving from lying on your back to sitting on the side of a flat bed without using bedrails?: A Lot Help needed moving to and from a bed to a chair (including a wheelchair)?: A Lot Help needed standing up from a chair using your arms (e.g., wheelchair or bedside chair)?: A Lot Help needed to walk in hospital room?: Total Help needed climbing 3-5 steps with a railing? : Total 6 Click Score: 10    End of Session   Activity Tolerance: Patient tolerated treatment well;Patient limited by pain Patient left: in bed;with call bell/phone within reach;with bed alarm set Nurse Communication: Mobility status;Patient requests pain meds PT Visit Diagnosis: Unsteadiness on feet (R26.81);Other abnormalities of gait and mobility (R26.89);Difficulty in walking, not elsewhere classified (R26.2);Pain Pain - Right/Left: Right Pain - part of body: Hip     Time: 2951-8841 PT Time Calculation (min) (ACUTE ONLY): 27 min  Charges:  $Therapeutic Activity: 8-22 mins                     Mabeline Caras, PT, DPT Acute Rehabilitation Services  Pager [phone] Office [place] 07/29/2019, 5:34  PM

## 2019-07-29 NOTE — TOC Progression Note (Signed)
Transition of Care [facility]) - Progression Note    Patient Details  Name: [name] MRN: [id number] Date of Birth: Oct 03, 1945  Transition of Care [facility]) CM/SW [place], [place] Phone Number: 07/29/2019, 12:10 PM  Clinical Narrative:    Pt rec for SNF, CSW met with pt at bedside. Introduced self, role, reason for visit. She has already spoken with [facility]. She gives permission for SNF referral to be made. States she has a friend who is in a SNF currently and she will touch base with her to see if she feels experience has been good. TOC team has sent out SNF referral.   [employer] contacted for tentative SNF, ref #[id number].   Expected Discharge Plan: [place] Barriers to Discharge: Continued Medical Work up  Expected Discharge Plan and Services Expected Discharge Plan: [place] Discharge Planning Services: CM Consult Post Acute Care Choice: [place] arrangements for the past 2 months: Single Family Home DME Agency: [address] Agency: [place]   Readmission Risk Interventions No flowsheet data found.

## 2019-07-30 LAB — GLUCOSE, CAPILLARY
Glucose-Capillary: 114 mg/dL — ABNORMAL HIGH (ref 70–99)
Glucose-Capillary: 101 mg/dL — ABNORMAL HIGH (ref 70–99)
Glucose-Capillary: 103 mg/dL — ABNORMAL HIGH (ref 70–99)
Glucose-Capillary: 116 mg/dL — ABNORMAL HIGH (ref 70–99)

## 2019-07-30 LAB — CBC
HCT: 22.7 % — ABNORMAL LOW (ref 36.0–46.0)
Hemoglobin: 7 g/dL — ABNORMAL LOW (ref 12.0–15.0)
MCH: 31.1 pg (ref 26.0–34.0)
MCHC: 30.8 g/dL (ref 30.0–36.0)
MCV: 100.9 fL — ABNORMAL HIGH (ref 80.0–100.0)
Platelets: 216 10*3/uL (ref 150–400)
RBC: 2.25 MIL/uL — ABNORMAL LOW (ref 3.87–5.11)
RDW: 14.4 % (ref 11.5–15.5)
WBC: 11.1 10*3/uL — ABNORMAL HIGH (ref 4.0–10.5)
nRBC: 0 % (ref 0.0–0.2)

## 2019-07-30 NOTE — Progress Notes (Addendum)
Subjective:  Patient reports pain as moderate to severe.  Denies N/V/CP/SOB. C/o LBP.   Objective:   VITALS:   Vitals:   07/29/19 1244 07/29/19 1652 07/29/19 2128 07/30/19 0530  BP: (!) 115/45 (!) 115/42 (!) 107/49 (!) 98/46  Pulse: 65 69 74 68  Resp: 20 18 16 16   Temp: 99.3 F (37.4 C) 99 F (37.2 C) 98.6 F (37 C) 99 F (37.2 C)  TempSrc: Oral Oral Oral Oral  SpO2: 94% 97% 95% 91%  Weight:      Height:        NAD ABD soft Sensation intact distally Intact pulses distally Dorsiflexion/Plantar flexion intact Incision: dressing C/D/I Compartment soft VAC intact with good seal Abduction brace in place  Lab Results  Component Value Date   WBC 11.1 (H) 07/30/2019   HGB 7.0 (L) 07/30/2019   HCT 22.7 (L) 07/30/2019   MCV 100.9 (H) 07/30/2019   PLT 216 07/30/2019   BMET    Component Value Date/Time   NA 136 07/29/2019 0251   K 4.7 07/29/2019 0251   CL 105 07/29/2019 0251   CO2 23 07/29/2019 0251   GLUCOSE 239 (H) 07/29/2019 0251   BUN 9 07/29/2019 0251   CREATININE 0.74 07/29/2019 0251   CALCIUM 9.2 07/29/2019 0251   GFRNONAA >60 07/29/2019 0251   GFRAA >60 07/29/2019 0251   Recent Results (from the past 240 hour(s))  Surgical pcr screen     Status: None   Collection Time: 07/20/19 12:35 PM   Specimen: Nasal Mucosa; Nasal Swab  Result Value Ref Range Status   MRSA, PCR NEGATIVE NEGATIVE Final   Staphylococcus aureus NEGATIVE NEGATIVE Final    Comment: (NOTE) The Xpert SA Assay (FDA approved for NASAL specimens in patients 4 years of age and older), is one component of a comprehensive surveillance program. It is not intended to diagnose infection nor to guide or monitor treatment. Performed at [facility], [address], [place], [place]   SARS CORONAVIRUS 2 (TAT 6-24 HRS) Nasopharyngeal Nasopharyngeal Swab     Status: None   Collection Time: 07/24/19 10:52 AM   Specimen: Nasopharyngeal Swab  Result Value Ref Range Status    SARS Coronavirus 2 NEGATIVE NEGATIVE Final    Comment: (NOTE) SARS-CoV-2 target nucleic acids are NOT DETECTED.  The SARS-CoV-2 RNA is generally detectable in upper and lower respiratory specimens during the acute phase of infection. Negative results do not preclude SARS-CoV-2 infection, do not rule out co-infections with other pathogens, and should not be used as the sole basis for treatment or other patient management decisions. Negative results must be combined with clinical observations, patient history, and epidemiological information. The expected result is Negative.  Fact Sheet for Patients: 07/26/19  Fact Sheet for Healthcare Providers: HairSlick.no  This test is not yet approved or cleared by the quierodirigir.com FDA and  has been authorized for detection and/or diagnosis of SARS-CoV-2 by FDA under an Emergency Use Authorization (EUA). This EUA will remain  in effect (meaning this test can be used) for the duration of the COVID-19 declaration under Se ction 564(b)(1) of the Act, 21 U.S.C. section 360bbb-3(b)(1), unless the authorization is terminated or revoked sooner.  Performed at [facility], [address] [place], [place]   Aerobic/Anaerobic Culture (surgical/deep wound)     Status: None (Preliminary result)   Collection Time: 07/28/19  5:01 PM   Specimen: PATH Other; Body Fluid  Result Value Ref Range  Status   Specimen Description HIP  Final   Special Requests RIGHT FEMORAL INTERFACE  Final   Gram Stain   Final    FEW WBC PRESENT,BOTH PMN AND MONONUCLEAR NO ORGANISMS SEEN    Culture   Final    CULTURE REINCUBATED FOR BETTER GROWTH Performed at [facility], [address], [place], [place] [address]    Report Status PENDING  Incomplete    Assessment/Plan: 2 Days Post-Op   Principal Problem:   Failed total hip arthroplasty ([place])   TDWB RLE with walker No active  abduction for 6 weeks Posterior hip precautions Abduction brace at all times except for hygiene DVT ppx: apixaban, SCDs, TEDS PO pain control PT/OT DM2: restart home meds, sliding scale PRN ABLA: asymptomatic, recheck in am, transfuse HgB < 7 Dispo: d/c home with HHPT Sunday vs Monday, will need WC and likely hospital bed, convert house VAC to portable prevena unit upon d/c   Dorothyann Peng 07/30/2019, 11:22 AM   Rod Can, MD [phone] Lane is now [facility]  [place] [address], [place], [place] [address] Phone: [phone] [email or web address] Facebook  [employer]

## 2019-07-30 NOTE — TOC Progression Note (Signed)
Transition of Care [facility]) - Progression Note    Patient Details  Name: [name] MRN: [id number] Date of Birth: 07-08-1945  Transition of Care [facility]) CM/SW Contact  Doy Hut[place], [place] Phone Number: 07/30/2019, 10:25 AM  Clinical Narrative:    CSW spoke with pt at bedside. Pt daughter [name] also at bedside. Pt provided with SNF offers. Pt daughter and pt other children will provide assistance at home and have a schedule for at least the first two weeks and as needed after that.   Pt interested in hospital bed at home and a wheelchair. Pt daughter will be primary contact [name]). They will be able to be present for delivery. TOC RNCM Judeth Cornfield also aware, we will need orders for DME to fill through [employer] and [facility] orders/face to face. Pt preferred [facility] provider is Artist (familiar from services her husband had in the past).   TOC team will continue to provide support with disposition. Pt provided with encouragement to mobilize/work with therapies as best she can for home.    Expected Discharge Plan: Home w Home Health Services Barriers to Discharge: Continued Medical Work up  Expected Discharge Plan and Services Expected Discharge Plan: Home w Home Health Services   Discharge Planning Services: CM Consult Post Acute Care Choice: Home Health Living arrangements for the past 2 months: Single Family Home DME Agency: [facility] Agency: [facility]  Readmission Risk Interventions No flowsheet data found.

## 2019-07-30 NOTE — TOC Progression Note (Signed)
Transition of Care [facility]) - Progression Note    Patient Details  Name: Beth Goodlin MRN: [id number] Date of Birth: 1945/03/09  Transition of Care [facility]) CM/SW Contact  Lockie Pares, RN Phone Number: 07/30/2019, 4:19 PM  Clinical Narrative:    Patient aware of eloquis Co-pay. Awaiting Hospital bed  Authorization from [employer]. Patient making plans for meals to be delivered and assistance at home.    Expected Discharge Plan: Home w Home Health Services Barriers to Discharge: Continued Medical Work up  Expected Discharge Plan and Services Expected Discharge Plan: Home w Home Health Services   Discharge Planning Services: CM Consult Post Acute Care Choice: Home Health Living arrangements for the past 2 months: Single Family Home                 DME Arranged: Gel overlay, Hospital bed, Lightweight manual wheelchair with seat cushion DME Agency: [employer] Date DME Agency Contacted: 07/23/19 Time DME Agency Contacted: 1155 Representative spoke with at DME Agency: [place] HH Arranged: PT, OT, Nurse's Aide HH Agency: [facility] Date [facility] Agency Contacted: 07/30/19 Time HH Agency Contacted: 1148 Representative spoke with at [facility] Agency: Wayne Both   Social Determinants of Health (SDOH) Interventions    Readmission Risk Interventions No flowsheet data found.

## 2019-07-30 NOTE — Plan of Care (Signed)
Problem: Education: Goal: Knowledge of General Education information will improve Description: Including pain rating scale, medication(s)/side effects and non-pharmacologic comfort measures 07/30/2019 0119 by Keturah Shavers, RN Outcome: Progressing 07/30/2019 0119 by Keturah Shavers, RN Outcome: Progressing   Problem: Health Behavior/Discharge Planning: Goal: Ability to manage health-related needs will improve 07/30/2019 0119 by Keturah Shavers, RN Outcome: Progressing 07/30/2019 0119 by Keturah Shavers, RN Outcome: Progressing   Problem: Clinical Measurements: Goal: Ability to maintain clinical measurements within normal limits will improve 07/30/2019 0119 by Keturah Shavers, RN Outcome: Progressing 07/30/2019 0119 by Keturah Shavers, RN Outcome: Progressing Goal: Will remain free from infection 07/30/2019 0119 by Keturah Shavers, RN Outcome: Progressing 07/30/2019 0119 by Keturah Shavers, RN Outcome: Progressing Goal: Diagnostic test results will improve 07/30/2019 0119 by Keturah Shavers, RN Outcome: Progressing 07/30/2019 0119 by Keturah Shavers, RN Outcome: Progressing Goal: Respiratory complications will improve 07/30/2019 0119 by Keturah Shavers, RN Outcome: Progressing 07/30/2019 0119 by Keturah Shavers, RN Outcome: Progressing Goal: Cardiovascular complication will be avoided 07/30/2019 0119 by Keturah Shavers, RN Outcome: Progressing 07/30/2019 0119 by Keturah Shavers, RN Outcome: Progressing   Problem: Activity: Goal: Risk for activity intolerance will decrease 07/30/2019 0119 by Keturah Shavers, RN Outcome: Progressing 07/30/2019 0119 by Keturah Shavers, RN Outcome: Progressing   Problem: Nutrition: Goal: Adequate nutrition will be maintained 07/30/2019 0119 by Keturah Shavers, RN Outcome: Progressing 07/30/2019 0119 by Keturah Shavers, RN Outcome: Progressing   Problem: Coping: Goal: Level of anxiety will decrease 07/30/2019 0119 by Keturah Shavers, RN Outcome: Progressing 07/30/2019 0119 by Keturah Shavers, RN Outcome: Progressing   Problem: Elimination: Goal: Will not experience complications related to bowel motility 07/30/2019 0119 by Keturah Shavers, RN Outcome: Progressing 07/30/2019 0119 by Keturah Shavers, RN Outcome: Progressing Goal: Will not experience complications related to urinary retention 07/30/2019 0119 by Keturah Shavers, RN Outcome: Progressing 07/30/2019 0119 by Keturah Shavers, RN Outcome: Progressing   Problem: Pain Managment: Goal: General experience of comfort will improve 07/30/2019 0119 by Keturah Shavers, RN Outcome: Progressing 07/30/2019 0119 by Keturah Shavers, RN Outcome: Progressing   Problem: Safety: Goal: Ability to remain free from injury will improve 07/30/2019 0119 by Keturah Shavers, RN Outcome: Progressing 07/30/2019 0119 by Keturah Shavers, RN Outcome: Progressing   Problem: Skin Integrity: Goal: Risk for impaired skin integrity will decrease 07/30/2019 0119 by Keturah Shavers, RN Outcome: Progressing 07/30/2019 0119 by Keturah Shavers, RN Outcome: Progressing   Problem: Education: Goal: Knowledge of the prescribed therapeutic regimen will improve 07/30/2019 0119 by Keturah Shavers, RN Outcome: Progressing 07/30/2019 0119 by Keturah Shavers, RN Outcome: Progressing Goal: Understanding of discharge needs will improve 07/30/2019 0119 by Keturah Shavers, RN Outcome: Progressing 07/30/2019 0119 by Keturah Shavers, RN Outcome: Progressing Goal: Individualized Educational Video(s) 07/30/2019 0119 by Keturah Shavers, RN Outcome: Progressing 07/30/2019 0119 by Keturah Shavers, RN Outcome: Progressing   Problem: Activity: Goal: Ability to avoid complications of mobility impairment will improve 07/30/2019 0119 by Keturah Shavers, RN Outcome: Progressing 07/30/2019 0119 by Keturah Shavers, RN Outcome: Progressing Goal: Ability to tolerate increased activity  will improve 07/30/2019 0119 by Keturah Shavers, RN Outcome: Progressing 07/30/2019 0119 by Keturah Shavers, RN Outcome: Progressing   Problem: Clinical Measurements: Goal: Postoperative complications will be avoided or minimized 07/30/2019 0119 by Keturah Shavers, RN Outcome: Progressing 07/30/2019 0119 by Gwyneth Revels  A, RN Outcome: Progressing   Problem: Pain Management: Goal: Pain level will decrease with appropriate interventions 07/30/2019 0119 by Caroll Rancher, RN Outcome: Progressing 07/30/2019 0119 by Caroll Rancher, RN Outcome: Progressing   Problem: Skin Integrity: Goal: Will show signs of wound healing 07/30/2019 0119 by Caroll Rancher, RN Outcome: Progressing 07/30/2019 0119 by Caroll Rancher, RN Outcome: Progressing

## 2019-07-30 NOTE — Care Management (Signed)
Attempted IV start x 1 left upper arm, with # 22, no blood return,  Needle discarded safely in needle box, pressure held to site and bandage applied. IV team consult in sytem ASAP, due to patient requiring  pain medication

## 2019-07-30 NOTE — TOC Progression Note (Addendum)
Transition of Care [facility]) - Progression Note    Patient Details  Name: [name] MRN: [id number] Date of Birth: 03/02/1945  Transition of Care [facility]) CM/SW Contact  Lockie Pares, RN Phone Number: 07/30/2019, 11:56 AM  Clinical Narrative:    Spoke with Patient and daughter. Called insurance company to validate meals 2 x day bnefit as well as hospital bed benefit and co-pay. Co pay would be 20%. In network providers are [place]. Reached out to both, who do not have  Any hospital beds or wheelchairs to provide patient. Then reached out to [employer]. They can provide hospital bed and lightweight wheelchair. Will keep [place] updated as far as  Authorization. Spoke to Dr.  Linna Caprice. Orders in place.  He stated eh will do face to face for Home health. He will place a provena to go home with.  Patients choice for [facility] is [facility] They will be providing PT,OT and ide once patient is discharged.   Phone number for meal benefit s post discharge  given to patient so she can call and inquire about them.    Expected Discharge Plan: Home w Home Health Services Barriers to Discharge: Continued Medical Work up  Expected Discharge Plan and Services Expected Discharge Plan: Home w Home Health Services   Discharge Planning Services: CM Consult Post Acute Care Choice: Home Health Living arrangements for the past 2 months: Single Family Home                 DME Arranged: Gel overlay, Hospital bed, Lightweight manual wheelchair with seat cushion DME Agency: [employer] Date DME Agency Contacted: 07/23/19 Time DME Agency Contacted: 1155 Representative spoke with at DME Agency: [place] HH Arranged: PT, OT, Nurse's Aide HH Agency: [facility] Date [facility] Agency Contacted: 07/30/19 Time HH Agency Contacted: 1148 Representative spoke with at [facility] Agency: Wayne Both   Social Determinants of Health (SDOH) Interventions    Readmission Risk Interventions No flowsheet data  found.

## 2019-07-30 NOTE — Discharge Instructions (Addendum)
Dr. Samson Frederic Joint Replacement Specialist [facility] [address], [address] [place], [place] [address] [phone]   TOTAL HIP REPLACEMENT POSTOPERATIVE DIRECTIONS    Hip Rehabilitation, Guidelines Following Surgery   WEIGHT BEARING Other:  Touch down weight bearing right leg with walker, posterior hip precautions  The results of a hip operation are greatly improved after range of motion and muscle strengthening exercises. Follow all safety measures which are given to protect your hip. If any of these exercises cause increased pain or swelling in your joint, decrease the amount until you are comfortable again. Then slowly increase the exercises. Call your caregiver if you have problems or questions.   HOME CARE INSTRUCTIONS  Most of the following instructions are designed to prevent the dislocation of your new hip.  Remove items at home which could result in a fall. This includes throw rugs or furniture in walking pathways.  Continue medications as instructed at time of discharge.  You may have some home medications which will be placed on hold until you complete the course of blood thinner medication.  You may start showering once you are discharged home. Do not remove your dressing. Do not put on socks or shoes without following the instructions of your caregivers.   Sit on chairs with arms. Use the chair arms to help push yourself up when arising.  Arrange for the use of a toilet seat elevator so you are not sitting low.   Walk with walker as instructed.  You may resume a sexual relationship in one month or when given the OK by your caregiver.  Use walker as long as suggested by your caregivers.  You may put full weight on your legs and walk as much as is comfortable. Avoid periods of inactivity such as sitting longer than an hour when not asleep. This helps prevent blood clots.  You may return to work once you are cleared by Designer, industrial/product.  Do not  drive a car for 6 weeks or until released by your surgeon.  Do not drive while taking narcotics.  Wear elastic stockings for two weeks following surgery during the day but you may remove then at night.  Make sure you keep all of your appointments after your operation with all of your doctors and caregivers. You should call the office at the above phone number and make an appointment for approximately two weeks after the date of your surgery. Please pick up a stool softener and laxative for home use as long as you are requiring pain medications.  ICE to the affected hip every three hours for 30 minutes at a time and then as needed for pain and swelling. Continue to use ice on the hip for pain and swelling from surgery. You may notice swelling that will progress down to the foot and ankle.  This is normal after surgery.  Elevate the leg when you are not up walking on it.   It is important for you to complete the blood thinner medication as prescribed by your doctor.  Continue to use the breathing machine which will help keep your temperature down.  It is common for your temperature to cycle up and down following surgery, especially at night when you are not up moving around and exerting yourself.  The breathing machine keeps your lungs expanded and your temperature down.  RANGE OF MOTION AND STRENGTHENING EXERCISES  These exercises are designed to help you keep full movement of your hip joint. Follow your caregiver's  or physical therapist's instructions. Perform all exercises about fifteen times, three times per day or as directed. Exercise both hips, even if you have had only one joint replacement. These exercises can be done on a training (exercise) mat, on the floor, on a table or on a bed. Use whatever works the best and is most comfortable for you. Use music or television while you are exercising so that the exercises are a pleasant break in your day. This will make your life better with the exercises  acting as a break in routine you can look forward to.  Lying on your back, slowly slide your foot toward your buttocks, raising your knee up off the floor. Then slowly slide your foot back down until your leg is straight again.  Lying on your back spread your legs as far apart as you can without causing discomfort.  Lying on your side, raise your upper leg and foot straight up from the floor as far as is comfortable. Slowly lower the leg and repeat.  Lying on your back, tighten up the muscle in the front of your thigh (quadriceps muscles). You can do this by keeping your leg straight and trying to raise your heel off the floor. This helps strengthen the largest muscle supporting your knee.  Lying on your back, tighten up the muscles of your buttocks both with the legs straight and with the knee bent at a comfortable angle while keeping your heel on the floor.   SKILLED REHAB INSTRUCTIONS: If the patient is transferred to a skilled rehab facility following release from the hospital, a list of the current medications will be sent to the facility for the patient to continue.  When discharged from the skilled rehab facility, please have the facility set up the patient's Home Health Physical Therapy prior to being released. Also, the skilled facility will be responsible for providing the patient with their medications at time of release from the facility to include their pain medication and their blood thinner medication. If the patient is still at the rehab facility at time of the two week follow up appointment, the skilled rehab facility will also need to assist the patient in arranging follow up appointment in our office and any transportation needs.  MAKE SURE YOU:  Understand these instructions.  Will watch your condition.  Will get help right away if you are not doing well or get worse.  Pick up stool softner and laxative for home use following surgery while on pain medications. Do not remove your  dressing. Keep dressing clean and dry. Charge VAC unit nightly. Posterior hip precautions. Abduction brace at all times - may remove for daily skin check and hygiene. Touch down weight bearing right leg. Continue to use ice for pain and swelling after surgery. Do not use any lotions or creams on the incision until instructed by your surgeon. Total Hip Protocol.   =========================================================================================================  Information on my medicine - ELIQUIS (apixaban)  Why was Eliquis prescribed for you? Eliquis was prescribed for you to reduce the risk of blood clots forming after orthopedic surgery.    What do You need to know about Eliquis? Take your Eliquis TWICE DAILY - one tablet in the morning and one tablet in the evening with or without food.  It would be best to take the dose about the same time each day.  If you have difficulty swallowing the tablet whole please discuss with your pharmacist how to take the medication safely.  Take Eliquis  exactly as prescribed by your doctor and DO NOT stop taking Eliquis without talking to the doctor who prescribed the medication.  Stopping without other medication to take the place of Eliquis may increase your risk of developing a clot.  After discharge, you should have regular check-up appointments with your healthcare provider that is prescribing your Eliquis.  What do you do if you miss a dose? If a dose of ELIQUIS is not taken at the scheduled time, take it as soon as possible on the same day and twice-daily administration should be resumed.  The dose should not be doubled to make up for a missed dose.  Do not take more than one tablet of ELIQUIS at the same time.  Important Safety Information A possible side effect of Eliquis is bleeding. You should call your healthcare provider right away if you experience any of the following: ? Bleeding from an injury or your nose that  does not stop. ? Unusual colored urine (red or dark brown) or unusual colored stools (red or black). ? Unusual bruising for unknown reasons. ? A serious fall or if you hit your head (even if there is no bleeding).  Some medicines may interact with Eliquis and might increase your risk of bleeding or clotting while on Eliquis. To help avoid this, consult your healthcare provider or pharmacist prior to using any new prescription or non-prescription medications, including herbals, vitamins, non-steroidal anti-inflammatory drugs (NSAIDs) and supplements.  This website has more information on Eliquis (apixaban): http://www.eliquis.com/eliquis/home

## 2019-07-31 ENCOUNTER — Inpatient Hospital Stay: Payer: Self-pay

## 2019-07-31 LAB — CBC
HCT: 23 % — ABNORMAL LOW (ref 36.0–46.0)
Hemoglobin: 7.3 g/dL — ABNORMAL LOW (ref 12.0–15.0)
MCH: 31.9 pg (ref 26.0–34.0)
MCHC: 31.7 g/dL (ref 30.0–36.0)
MCV: 100.4 fL — ABNORMAL HIGH (ref 80.0–100.0)
Platelets: 249 10*3/uL (ref 150–400)
RBC: 2.29 MIL/uL — ABNORMAL LOW (ref 3.87–5.11)
RDW: 14.6 % (ref 11.5–15.5)
WBC: 10.4 10*3/uL (ref 4.0–10.5)
nRBC: 0 % (ref 0.0–0.2)

## 2019-07-31 LAB — GLUCOSE, CAPILLARY
Glucose-Capillary: 71 mg/dL (ref 70–99)
Glucose-Capillary: 113 mg/dL — ABNORMAL HIGH (ref 70–99)
Glucose-Capillary: 132 mg/dL — ABNORMAL HIGH (ref 70–99)
Glucose-Capillary: 138 mg/dL — ABNORMAL HIGH (ref 70–99)

## 2019-07-31 LAB — SEDIMENTATION RATE: Sed Rate: 67 mm/hr — ABNORMAL HIGH (ref 0–22)

## 2019-07-31 MED ORDER — SODIUM CHLORIDE 0.9 % IV SOLN
800.0000 mg | Freq: Every day | INTRAVENOUS | Status: DC
  Administered 2019-07-31: 800 mg via INTRAVENOUS
  Filled 2019-07-31 (×2): qty 16

## 2019-07-31 MED ORDER — SODIUM CHLORIDE 0.9 % IV SOLN
800.0000 mg | INTRAVENOUS | Status: DC
  Filled 2019-07-31: qty 16

## 2019-07-31 NOTE — Consult Note (Signed)
[facility]  Total days of antibiotics 1        Day 1 daptomycin       Reason for Consult: enterococcal hip infection   Referring Physician: [email or web address]  Principal Problem:   Failed total hip arthroplasty (HCC)    HPI: [name] is a 74 y.o. female with RA on methotrexate weekly, and biologic every 8 wks, has a history of failed total hip arthroplasty with chronic hip pain that has worsened of late. Dr Veda Canning took patient to OR for revision of right total hip arhtroplasty of entire femoral component. No visible purulence in joint space, specimen from femoral canal sent for culture which showed few wbc on gram stain but grew rare e.faecalis. pre op abtx was clinda and vancomycin. She reports hx of penicillin allergy as a child but tolerates amoxicillin.  Past Medical History:  Diagnosis Date  . Chronic fatigue   . Depression   . Diabetes mellitus without complication (HCC)   . Early cataracts, bilateral   . Fibromyalgia   . Glaucoma   . Headache    Vestibular migraines  . History of bronchitis   . History of pneumonia   . Hypercholesterolemia   . RA (rheumatoid arthritis) (HCC)   . RA (rheumatoid arthritis) (HCC)   . TIA (transient ischemic attack)   . Vertigo   . Wears glasses     Allergies:  Allergies  Allergen Reactions  . Penicillins Itching and Swelling    Has patient had a PCN reaction causing immediate rash, facial/tongue/throat swelling, SOB or lightheadedness with hypotension: Yes Has patient had a PCN reaction causing severe rash involving mucus membranes or skin necrosis: No Has patient had a PCN reaction that required hospitalization No Has patient had a PCN reaction occurring within the last 10 years: No If all of the above answers are "NO", then may proceed with Cephalosporin use.   . Sulfa Antibiotics Other (See Comments)    Don't remember     Current antibiotics:   MEDICATIONS: . apixaban  2.5 mg Oral Q12H  .  cholecalciferol  2,000 Units Oral Daily  . docusate sodium  100 mg Oral BID  . DULoxetine  60 mg Oral Daily  . ezetimibe  10 mg Oral Daily  . fenofibrate  54 mg Oral Daily  . folic acid  1 mg Oral Daily  . gabapentin  300 mg Oral QHS  . hydrOXYzine  25 mg Oral Daily  . insulin aspart  0-5 Units Subcutaneous QHS  . insulin aspart  0-9 Units Subcutaneous TID WC  . latanoprost  1 drop Both Eyes QHS  . loratadine  10 mg Oral Daily  . pioglitazone  30 mg Oral QAC breakfast  . senna  1 tablet Oral BID  . topiramate  50 mg Oral QHS    Social History   Tobacco Use  . Smoking status: Never Smoker  . Smokeless tobacco: Never Used  Vaping Use  . Vaping Use: Never used  Substance Use Topics  . Alcohol use: No  . Drug use: No    Family History  Problem Relation Age of Onset  . Diabetes Mother   . Stroke Mother   . Heart disease Mother   . High blood pressure Father   . Stroke Father   . Diabetes Father   . Heart disease Father   . Heart disease Sister      Review of Systems  Constitutional: Negative for fever, chills, diaphoresis,  activity change, appetite change, fatigue and unexpected weight change.  HENT: Negative for congestion, sore throat, rhinorrhea, sneezing, trouble swallowing and sinus pressure.  Eyes: Negative for photophobia and visual disturbance.  Respiratory: Negative for cough, chest tightness, shortness of breath, wheezing and stridor.  Cardiovascular: Negative for chest pain, palpitations and leg swelling.  Gastrointestinal: Negative for nausea, vomiting, abdominal pain, diarrhea, constipation, blood in stool, abdominal distention and anal bleeding.  Genitourinary: Negative for dysuria, hematuria, flank pain and difficulty urinating.  Musculoskeletal: +right hip pain and joint pains Skin: Negative for color change, pallor, rash and wound.  Neurological: Negative for dizziness, tremors, weakness and light-headedness.  Hematological: Negative for adenopathy.  Does not bruise/bleed easily.  Psychiatric/Behavioral: Negative for behavioral problems, confusion, sleep disturbance, dysphoric mood, decreased concentration and agitation.     OBJECTIVE: Temp:  [98 F (36.7 C)-98.2 F (36.8 C)] 98 F (36.7 C) (06/26 0537) Pulse Rate:  [86-90] 90 (06/26 0537) Resp:  [16-17] 17 (06/26 0537) BP: (125-126)/(42-47) 125/42 (06/26 0537) SpO2:  [90 %-94 %] 94 % (06/26 0537) Physical Exam  Constitutional:  oriented to person, place, and time. appears well-developed and well-nourished. No distress.  HENT: [place]/AT, PERRLA, no scleral icterus Mouth/Throat: Oropharynx is clear and moist. No oropharyngeal exudate.  Cardiovascular: Normal rate, regular rhythm and normal heart sounds. Exam reveals no gallop and no friction rub.  No murmur heard.  Pulmonary/Chest: Effort normal and breath sounds normal. No respiratory distress.  has no wheezes.  Neck = supple, no nuchal rigidity OBS:JGGEZ hip bandaged Abdominal: Soft. Bowel sounds are normal.  exhibits no distension. There is no tenderness.  Lymphadenopathy: no cervical adenopathy. No axillary adenopathy Neurological: alert and oriented to person, place, and time.  Skin: Skin is warm and dry. No rash noted. No erythema.  Psychiatric: a normal mood and affect.  behavior is normal.   LABS: Results for orders placed or performed during the hospital encounter of 07/28/19 (from the past 48 hour(s))  Glucose, capillary     Status: Abnormal   Collection Time: 07/29/19  5:11 PM  Result Value Ref Range   Glucose-Capillary 184 (H) 70 - 99 mg/dL    Comment: Glucose reference range applies only to samples taken after fasting for at least 8 hours.  Glucose, capillary     Status: Abnormal   Collection Time: 07/29/19 10:14 PM  Result Value Ref Range   Glucose-Capillary 150 (H) 70 - 99 mg/dL    Comment: Glucose reference range applies only to samples taken after fasting for at least 8 hours.  CBC     Status: Abnormal    Collection Time: 07/30/19  2:36 AM  Result Value Ref Range   WBC 11.1 (H) 4.0 - 10.5 K/uL   RBC 2.25 (L) 3.87 - 5.11 MIL/uL   Hemoglobin 7.0 (L) 12.0 - 15.0 g/dL   HCT 66.2 (L) 36 - 46 %   MCV 100.9 (H) 80.0 - 100.0 fL   MCH 31.1 26.0 - 34.0 pg   MCHC 30.8 30.0 - 36.0 g/dL   RDW 94.7 65.4 - 65.0 %   Platelets 216 150 - 400 K/uL   nRBC 0.0 0.0 - 0.2 %    Comment: Performed at [facility], [address], [place], [place] [address]  Glucose, capillary     Status: Abnormal   Collection Time: 07/30/19  8:07 AM  Result Value Ref Range   Glucose-Capillary 114 (H) 70 - 99 mg/dL    Comment: Glucose reference range applies only to samples  taken after fasting for at least 8 hours.  Glucose, capillary     Status: Abnormal   Collection Time: 07/30/19 11:40 AM  Result Value Ref Range   Glucose-Capillary 101 (H) 70 - 99 mg/dL    Comment: Glucose reference range applies only to samples taken after fasting for at least 8 hours.  Glucose, capillary     Status: Abnormal   Collection Time: 07/30/19  6:01 PM  Result Value Ref Range   Glucose-Capillary 103 (H) 70 - 99 mg/dL    Comment: Glucose reference range applies only to samples taken after fasting for at least 8 hours.  Glucose, capillary     Status: Abnormal   Collection Time: 07/30/19  8:50 PM  Result Value Ref Range   Glucose-Capillary 116 (H) 70 - 99 mg/dL    Comment: Glucose reference range applies only to samples taken after fasting for at least 8 hours.  CBC     Status: Abnormal   Collection Time: 07/31/19  3:05 AM  Result Value Ref Range   WBC 10.4 4.0 - 10.5 K/uL   RBC 2.29 (L) 3.87 - 5.11 MIL/uL   Hemoglobin 7.3 (L) 12.0 - 15.0 g/dL   HCT 23.0 (L) 36 - 46 %   MCV 100.4 (H) 80.0 - 100.0 fL   MCH 31.9 26.0 - 34.0 pg   MCHC 31.7 30.0 - 36.0 g/dL   RDW 14.6 11.5 - 15.5 %   Platelets 249 150 - 400 K/uL   nRBC 0.0 0.0 - 0.2 %    Comment: Performed at [facility], [address] [place], [place] [address]  Glucose,  capillary     Status: None   Collection Time: 07/31/19  8:02 AM  Result Value Ref Range   Glucose-Capillary 71 70 - 99 mg/dL    Comment: Glucose reference range applies only to samples taken after fasting for at least 8 hours.  Glucose, capillary     Status: Abnormal   Collection Time: 07/31/19 12:11 PM  Result Value Ref Range   Glucose-Capillary 113 (H) 70 - 99 mg/dL    Comment: Glucose reference range applies only to samples taken after fasting for at least 8 hours.    MICRO:  IMAGING: No results found.  HISTORICAL MICRO/IMAGING  Assessment/Plan:  74yo F incidentally found to have enterococcal + cx from femoral canal specimen. Plan to treat as femoral osteo.  - will plan to do daptomycin x 6 wk - will need picc line - will get sed rate and crp - if patient is still here on Monday, we can do ampicillin testing to see if can switch her IV abtx to ampicillin - await sensitivities on e.faecalis. - after 6 wk of IV abtx, can likely finish out course with amoxicillin since she has tolerated in the past. - will need patient to follow up also wit dr Lenna Gilford, her rheumatologist, to postpone her Q 2 month immune modulator  Rhemi Balbach B. [facility] [phone]

## 2019-07-31 NOTE — Progress Notes (Signed)
°  Pharmacy Antibiotic Note  [name] is a 74 y.o. female admitted on 07/28/2019 with a chief complaint of right hip pain. History of failed THA and failed non-surgical conservative treatment methods.  Pharmacy has been consulted for daptomycin dosing. Patient underwent surgery on 6/23. Now is growing a rare enterococcus faecalis per body fluid culture draw (susceptibilities not reported, still in progress as of 6/26). Patients renal function wnl, with scr at 0.74, wbc 11.1>10.4 today, afrebrile, RR / HR wnl. Patient weighs 86kg.   Plan: Start daptomycin 800 mg IV every 24 hour Follow-up signs and symptoms of infection, CK q72h, renal function (bMET), and cultures/sensitivities  Height: 5' (152.4 cm) Weight: 86.2 kg (190 lb) IBW/kg (Calculated) : 45.5  Temp (24hrs), Avg:98.1 F (36.7 C), Min:98 F (36.7 C), Max:98.2 F (36.8 C)  Recent Labs  Lab 07/29/19 0251 07/30/19 0236 07/31/19 0305  WBC 9.7 11.1* 10.4  CREATININE 0.74  --   --     Estimated Creatinine Clearance: 62 mL/min (by C-G formula based on SCr of 0.74 mg/dL).    Allergies  Allergen Reactions   Penicillins Itching and Swelling    Has patient had a PCN reaction causing immediate rash, facial/tongue/throat swelling, SOB or lightheadedness with hypotension: Yes Has patient had a PCN reaction causing severe rash involving mucus membranes or skin necrosis: No Has patient had a PCN reaction that required hospitalization No Has patient had a PCN reaction occurring within the last 10 years: No If all of the above answers are "NO", then may proceed with Cephalosporin use.    Sulfa Antibiotics Other (See Comments)    Don't remember     Antimicrobials this admission: Daptomycin 6/26 >>  Clinda 6/23 x1 Vanc 6/23 >> 6/24  Dose adjustments this admission: N/a  Microbiology results: 6/26 BodyFluidCx: e.facalis   Thank you for the interesting consult and for involving pharmacy in this patients  care.  Bradley Ferris, PharmD, BCPS 07/31/2019 1:48 PM PGY-2 Pharmacy Administration Resident Please check [email or web address] for unit-specific pharmacist phone numbers

## 2019-07-31 NOTE — Progress Notes (Signed)
Inetta Fermo RN notified that PICC to be placed Sunday for outpatient antibiotic therapy.

## 2019-07-31 NOTE — Progress Notes (Addendum)
Subjective:  Patient reports pain as moderate to severe.  Denies N/V/CP/SOB. Continues to c/o LBP.   Objective:   VITALS:   Vitals:   07/30/19 0530 07/30/19 1727 07/30/19 2052 07/31/19 0537  BP: (!) 98/46 (!) 125/47 (!) 126/45 (!) 125/42  Pulse: 68 87 86 90  Resp: 16 16 17 17   Temp: 99 F (37.2 C) 98.2 F (36.8 C) 98.1 F (36.7 C) 98 F (36.7 C)  TempSrc: Oral Oral Oral Oral  SpO2: 91% 90% 91% 94%  Weight:      Height:        NAD ABD soft Sensation intact distally Intact pulses distally Dorsiflexion/Plantar flexion intact Incision: dressing C/D/I Compartment soft VAC intact with good seal Abduction brace in place  Lab Results  Component Value Date   WBC 10.4 07/31/2019   HGB 7.3 (L) 07/31/2019   HCT 23.0 (L) 07/31/2019   MCV 100.4 (H) 07/31/2019   PLT 249 07/31/2019   BMET    Component Value Date/Time   NA 136 07/29/2019 0251   K 4.7 07/29/2019 0251   CL 105 07/29/2019 0251   CO2 23 07/29/2019 0251   GLUCOSE 239 (H) 07/29/2019 0251   BUN 9 07/29/2019 0251   CREATININE 0.74 07/29/2019 0251   CALCIUM 9.2 07/29/2019 0251   GFRNONAA >60 07/29/2019 0251   GFRAA >60 07/29/2019 0251   Recent Results (from the past 240 hour(s))  SARS CORONAVIRUS 2 (TAT 6-24 HRS) Nasopharyngeal Nasopharyngeal Swab     Status: None   Collection Time: 07/24/19 10:52 AM   Specimen: Nasopharyngeal Swab  Result Value Ref Range Status   SARS Coronavirus 2 NEGATIVE NEGATIVE Final    Comment: (NOTE) SARS-CoV-2 target nucleic acids are NOT DETECTED.  The SARS-CoV-2 RNA is generally detectable in upper and lower respiratory specimens during the acute phase of infection. Negative results do not preclude SARS-CoV-2 infection, do not rule out co-infections with other pathogens, and should not be used as the sole basis for treatment or other patient management decisions. Negative results must be combined with clinical observations, patient history, and epidemiological information.  The expected result is Negative.  Fact Sheet for Patients: 07/26/19  Fact Sheet for Healthcare Providers: HairSlick.no  This test is not yet approved or cleared by the quierodirigir.com FDA and  has been authorized for detection and/or diagnosis of SARS-CoV-2 by FDA under an Emergency Use Authorization (EUA). This EUA will remain  in effect (meaning this test can be used) for the duration of the COVID-19 declaration under Se ction 564(b)(1) of the Act, 21 U.S.C. section 360bbb-3(b)(1), unless the authorization is terminated or revoked sooner.  Performed at [facility], [address], [place], [place]   Aerobic/Anaerobic Culture (surgical/deep wound)     Status: None (Preliminary result)   Collection Time: 07/28/19  5:01 PM   Specimen: PATH Other; Body Fluid  Result Value Ref Range Status   Specimen Description HIP  Final   Special Requests RIGHT FEMORAL INTERFACE  Final   Gram Stain   Final    FEW WBC PRESENT,BOTH PMN AND MONONUCLEAR NO ORGANISMS SEEN    Culture   Final    RARE ENTEROCOCCUS FAECALIS CULTURE REINCUBATED FOR BETTER GROWTH Performed at [facility], [address], [place], [place]    Report Status PENDING  Incomplete    Assessment/Plan: 3 Days Post-Op   Principal Problem:   Failed total hip arthroplasty (HCC)   TDWB RLE with walker No  active abduction for 6 weeks Posterior hip precautions Abduction brace at all times except for hygiene DVT ppx:  apixaban , SCDs, TEDS PO pain control PT/OT DM2: restart home meds, sliding scale PRN ABLA: asymptomatic, recheck in am, improved today to 7.3 from 7.0, transfuse HgB < 7 Dispo: d/c home with HHPT Sunday vs Monday, will need WC and likely hospital bed, convert house VAC to portable prevena unit upon d/c   Dorothyann Peng, PA-C 07/31/2019, 10:29 AM     ADDENDUM: Intraop culture from femoral canal now positive  for enterococcus Faecalis. This is unexpected given intraop appearance and (-) preop workup. ? Contaminant. Given h/o RA and DM2, will need to treat. ID consult for abx selection. Will need 6 weeks IV abx followed by prolonged oral suppression. Will also need to figure out oral agent due to listed PCN allergy.  Bertram Savin, MD

## 2019-08-01 LAB — CBC
HCT: 24.4 % — ABNORMAL LOW (ref 36.0–46.0)
Hemoglobin: 7.7 g/dL — ABNORMAL LOW (ref 12.0–15.0)
MCH: 31.3 pg (ref 26.0–34.0)
MCHC: 31.6 g/dL (ref 30.0–36.0)
MCV: 99.2 fL (ref 80.0–100.0)
Platelets: 316 10*3/uL (ref 150–400)
RBC: 2.46 MIL/uL — ABNORMAL LOW (ref 3.87–5.11)
RDW: 14.3 % (ref 11.5–15.5)
WBC: 9.2 10*3/uL (ref 4.0–10.5)
nRBC: 0 % (ref 0.0–0.2)

## 2019-08-01 LAB — CK: Total CK: 851 U/L — ABNORMAL HIGH (ref 38–234)

## 2019-08-01 LAB — GLUCOSE, CAPILLARY
Glucose-Capillary: 117 mg/dL — ABNORMAL HIGH (ref 70–99)
Glucose-Capillary: 147 mg/dL — ABNORMAL HIGH (ref 70–99)
Glucose-Capillary: 147 mg/dL — ABNORMAL HIGH (ref 70–99)
Glucose-Capillary: 122 mg/dL — ABNORMAL HIGH (ref 70–99)

## 2019-08-01 LAB — C-REACTIVE PROTEIN: CRP: 18.7 mg/dL — ABNORMAL HIGH (ref <1.0)

## 2019-08-01 MED ORDER — VANCOMYCIN HCL 1500 MG/300ML IV SOLN
1500.0000 mg | Freq: Once | INTRAVENOUS | Status: AC
  Administered 2019-08-01: 1500 mg via INTRAVENOUS
  Filled 2019-08-01: qty 300

## 2019-08-01 MED ORDER — SODIUM CHLORIDE 0.9% FLUSH: 10.0000 mL | INTRAVENOUS | Status: DC | PRN

## 2019-08-01 MED ORDER — CHLORHEXIDINE GLUCONATE CLOTH 2 % EX PADS
6.0000 | MEDICATED_PAD | Freq: Every day | CUTANEOUS | Status: DC
  Administered 2019-08-01 – 2019-08-04 (×4): 6 via TOPICAL

## 2019-08-01 MED ORDER — FERROUS SULFATE 325 (65 FE) MG PO TABS
325.0000 mg | ORAL_TABLET | Freq: Three times a day (TID) | ORAL | Status: DC
  Administered 2019-08-01 – 2019-08-04 (×9): 325 mg via ORAL
  Filled 2019-08-01 (×10): qty 1

## 2019-08-01 MED ORDER — VANCOMYCIN HCL IN DEXTROSE 1-5 GM/200ML-% IV SOLN
1000.0000 mg | Freq: Two times a day (BID) | INTRAVENOUS | Status: DC
  Administered 2019-08-02 – 2019-08-03 (×4): 1000 mg via INTRAVENOUS
  Filled 2019-08-01 (×5): qty 200

## 2019-08-01 NOTE — Progress Notes (Signed)
Prior to PICC placement, pt requested to use Left arm to place PICC.  States that the right hand, digits 3,4, and 5 have been numb since surgery Wednesday.  No signs of IV noted in RH, small bruise  Noted at base of thumb she states probably from bumping area. Pt states no numbness in the hand portion, only 3 fingers.  Color WNL, temp WNL.  Encouraged her to notify MD.

## 2019-08-01 NOTE — Progress Notes (Addendum)
ID PROGRESS NOTE  74yo F with RA and hx of chronic right hip pain associated with failed implant who is s/p revision but incidentally found to have + enterococcal infection from femoral sample.  24hr: she reports right hand numbness since surgery. Had picc line placed. Remains afebrile. Baseline CK value elevated at 800s.   BP (!) 130/52 (BP Location: Right Arm)   Pulse 68   Temp 98.4 F (36.9 C) (Oral)   Resp 18   Ht 5' (1.524 m)   Wt 86.2 kg   SpO2 96%   BMI 37.11 kg/m  Physical Exam  Constitutional:  oriented to person, place, and time. appears well-developed and well-nourished. No distress.  HENT: [place]/AT, PERRLA, no scleral icterus Mouth/Throat: Oropharynx is clear and moist. No oropharyngeal exudate.  Cardiovascular: Normal rate, regular rhythm and normal heart sounds. Exam reveals no gallop and no friction rub.  No murmur heard.  Pulmonary/Chest: Effort normal and breath sounds normal. No respiratory distress.  has no wheezes.  Neck = supple, no nuchal rigidity Abdominal: Soft. Bowel sounds are normal.  exhibits no distension. There is no tenderness.  VPX:TGGYI hip wrapped/leg brace Lymphadenopathy: no cervical adenopathy. No axillary adenopathy Neurological: alert and oriented to person, place, and time.  Skin: Skin is warm and dry. No rash noted. No erythema.  Psychiatric: a normal mood and affect.  behavior is normal.   A/P: enterococcal prosthetic joint infection  Elevated CK = unclear if this is all from surgery vs. Combined with intrinsic elevated CKemia. This will pose problem with giving her daptomycin.  Numbness to right hand 3-4-5th digits = possibly due to ulnar compression from how she was situated for surgery. Anticipate to improve  Hx of childhood PCN allergy= Will plan to switch to vancomycin for now, and will have ID pharmacy do skintest/ dose challenge tomorrow with amoxicillin/ampicillin Will watch for any reactions.  Right foot pain = sound  neuropathic, was to see [facility] as outpatient at end of July. For now, continue with her current regimen. Consider checking vitamin levels , ? Peripheral neuropathy associated with vitamin deficiency/thiamine deficiency  RA= have her defer her biologics for 6-8 wks from now.

## 2019-08-01 NOTE — Progress Notes (Signed)
Pharmacy Antibiotic Note [name] is a 74 y.o. female admitted on 07/28/2019 with a chief complaint of right hip pain. History of failed THA and failed non-surgical conservative treatment methods.  Pharmacy has been consulted for vancomycin dosing. Patient underwent surgery on 6/23. Now is growing a rare enterococcus faecalis per body fluid culture draw (susceptibilities not reported, still in progress as of 6/27). Patient was started on daptomycin yesterday, but now desiring to swap to vancomycin due to the elevated CK. Unsure of the source of the elevated CK. WBC normalizing, down to 9.2 today.    Plan: Start Vancomycin 1500mg  (load dose), Followed by Vancomycin 1 gram IV every 12 hours Follow-up signs and symptoms of infection, renal function, and cultures/sensitivities, vanc trough at SS   Height: 5' (152.4 cm) Weight: 86.2 kg (190 lb) IBW/kg (Calculated) : 45.5  Temp (24hrs), Avg:98.3 F (36.8 C), Min:98 F (36.7 C), Max:98.4 F (36.9 C)  Recent Labs  Lab 07/29/19 0251 07/30/19 0236 07/31/19 0305 08/01/19 1119  WBC 9.7 11.1* 10.4 9.2  CREATININE 0.74  --   --   --     Estimated Creatinine Clearance: 62 mL/min (by C-G formula based on SCr of 0.74 mg/dL).    Allergies  Allergen Reactions  . Penicillins Itching and Swelling    Has patient had a PCN reaction causing immediate rash, facial/tongue/throat swelling, SOB or lightheadedness with hypotension: Yes Has patient had a PCN reaction causing severe rash involving mucus membranes or skin necrosis: No Has patient had a PCN reaction that required hospitalization No Has patient had a PCN reaction occurring within the last 10 years: No If all of the above answers are "NO", then may proceed with Cephalosporin use.   . Sulfa Antibiotics Other (See Comments)    Don't remember     Antimicrobials this admission: Daptomycin 6/26 >> 6/27 Clinda 6/23 x1 Vanc 6/23 >> 6/24, 6/27 >>  Dose adjustments this  admission: N/a  Microbiology results: 6/26 BodyFluidCx: e.facalis   Thank you for the interesting consult and for involving pharmacy in this patient's care.  7/26, PharmD, BCPS 08/01/2019 12:30 PM PGY-2 Pharmacy Administration Resident Please check [email or web address] for unit-specific pharmacist phone numbers

## 2019-08-01 NOTE — Progress Notes (Signed)
Dr Shon Baton, ortho, entered room during PICC placement.  Notified of pt c/o 3 digits numbness.

## 2019-08-01 NOTE — Progress Notes (Signed)
    Subjective: 4 Days Post-Op Procedure(s) (LRB): TOTAL HIP REVISION, posterior approach femoal comp (Right) Patient reports pain as 4 on 0-10 scale.   Denies CP or SOB.  Voiding without difficulty. Positive flatus. Objective: Vital signs in last 24 hours: Temp:  [98 F (36.7 C)-98.4 F (36.9 C)] 98.4 F (36.9 C) (06/27 0419) Pulse Rate:  [68-75] 68 (06/27 0419) Resp:  [18-19] 18 (06/27 0419) BP: (106-130)/(39-52) 130/52 (06/27 0419) SpO2:  [95 %-97 %] 96 % (06/27 0419)  Intake/Output from previous day: 06/26 0701 - 06/27 0700 In: 2003.2 [P.O.:400; I.V.:1487; IV Piggyback:116.1] Out: -  Intake/Output this shift: No intake/output data recorded.  Labs: Recent Labs    07/30/19 0236 07/31/19 0305  HGB 7.0* 7.3*   Recent Labs    07/30/19 0236 07/31/19 0305  WBC 11.1* 10.4  RBC 2.25* 2.29*  HCT 22.7* 23.0*  PLT 216 249   No results for input(s): NA, K, CL, CO2, BUN, CREATININE, GLUCOSE, CALCIUM in the last 72 hours. No results for input(s): LABPT, INR in the last 72 hours.  Physical Exam: Neurologically intact Intact pulses distally Incision: dressing C/D/I Compartment soft Body mass index is 37.11 kg/m.   Assessment/Plan: 4 Days Post-Op Procedure(s) (LRB): TOTAL HIP REVISION, posterior approach femoal comp (Right) Entered the room during the end of the PICC line placement.  Final picture and dressings were being applied. Notified about numbness in ulnar digits of the right hand - no focal motor deficits.  Will monitor Recheck HCT and start iron supplements.  Hold on transfusion per Dr Linna Caprice  Alvy Beal for Dr. Venita Lick [facility] [phone] 08/01/2019, 10:43 AM

## 2019-08-01 NOTE — Progress Notes (Signed)
Peripherally Inserted Central Catheter Placement  The IV Nurse has discussed with the patient and/or persons authorized to consent for the patient, the purpose of this procedure and the potential benefits and risks involved with this procedure.  The benefits include less needle sticks, lab draws from the catheter, and the patient may be discharged home with the catheter. Risks include, but not limited to, infection, bleeding, blood clot (thrombus formation), and puncture of an artery; nerve damage and irregular heartbeat and possibility to perform a PICC exchange if needed/ordered by physician.  Alternatives to this procedure were also discussed.  Bard Power PICC patient education guide, fact sheet on infection prevention and patient information card has been provided to patient /or left at bedside.    PICC Placement Documentation  PICC Single Lumen 08/01/19 PICC Left Cephalic 41 cm 0 cm (Active)  Indication for Insertion or Continuance of Line Home intravenous therapies (PICC only) 08/01/19 1036  Exposed Catheter (cm) 0 cm 08/01/19 1036  Site Assessment Clean;Dry;Intact 08/01/19 1036  Line Status Flushed;Saline locked;Blood return noted 08/01/19 1036  Dressing Type Transparent 08/01/19 1036  Dressing Status Clean;Dry;Intact;Antimicrobial disc in place 08/01/19 1036  Safety Lock Not Applicable 08/01/19 1036  Line Care Connections checked and tightened 08/01/19 1036  Line Adjustment (NICU/IV Team Only) No 08/01/19 1036  Dressing Intervention New dressing 08/01/19 1036  Dressing Change Due 08/08/19 08/01/19 1036       Elliot Dally 08/01/2019, 10:36 AM

## 2019-08-02 DIAGNOSIS — Z96649 Presence of unspecified artificial hip joint: Secondary | ICD-10-CM

## 2019-08-02 DIAGNOSIS — T84010D Broken internal right hip prosthesis, subsequent encounter: Secondary | ICD-10-CM

## 2019-08-02 LAB — BASIC METABOLIC PANEL
Anion gap: 11 (ref 5–15)
BUN: 11 mg/dL (ref 8–23)
CO2: 21 mmol/L — ABNORMAL LOW (ref 22–32)
Calcium: 9.6 mg/dL (ref 8.9–10.3)
Chloride: 106 mmol/L (ref 98–111)
Creatinine, Ser: 0.77 mg/dL (ref 0.44–1.00)
GFR calc Af Amer: >60 mL/min (ref >60)
GFR calc non Af Amer: >60 mL/min (ref >60)
Glucose, Bld: 153 mg/dL — ABNORMAL HIGH (ref 70–99)
Potassium: 3.3 mmol/L — ABNORMAL LOW (ref 3.5–5.1)
Sodium: 138 mmol/L (ref 135–145)

## 2019-08-02 LAB — GLUCOSE, CAPILLARY
Glucose-Capillary: 136 mg/dL — ABNORMAL HIGH (ref 70–99)
Glucose-Capillary: 147 mg/dL — ABNORMAL HIGH (ref 70–99)
Glucose-Capillary: 116 mg/dL — ABNORMAL HIGH (ref 70–99)
Glucose-Capillary: 168 mg/dL — ABNORMAL HIGH (ref 70–99)

## 2019-08-02 MED ORDER — DIPHENHYDRAMINE HCL 50 MG/ML IJ SOLN: 25.0000 mg | Freq: Once | INTRAMUSCULAR | Status: DC | PRN

## 2019-08-02 MED ORDER — OXYCODONE HCL 5 MG PO TABS: 5.0000 mg | ORAL_TABLET | ORAL | 0 refills | Status: DC | PRN

## 2019-08-02 MED ORDER — ONDANSETRON HCL 4 MG PO TABS: 4.0000 mg | ORAL_TABLET | Freq: Four times a day (QID) | ORAL | 0 refills | Status: DC | PRN

## 2019-08-02 MED ORDER — AMOXICILLIN 500 MG PO CAPS
500.0000 mg | ORAL_CAPSULE | Freq: Once | ORAL | Status: AC
  Administered 2019-08-02: 500 mg via ORAL
  Filled 2019-08-02: qty 1

## 2019-08-02 MED ORDER — ACETAMINOPHEN 325 MG PO TABS: 650.0000 mg | ORAL_TABLET | Freq: Four times a day (QID) | ORAL | 1 refills | Status: AC | PRN

## 2019-08-02 MED ORDER — SENNA 8.6 MG PO TABS: 2.0000 | ORAL_TABLET | Freq: Every day | ORAL | 1 refills | Status: DC

## 2019-08-02 MED ORDER — APIXABAN 2.5 MG PO TABS: 2.5000 mg | ORAL_TABLET | Freq: Two times a day (BID) | ORAL | 0 refills | Status: DC

## 2019-08-02 MED ORDER — DOCUSATE SODIUM 100 MG PO CAPS: 100.0000 mg | ORAL_CAPSULE | Freq: Two times a day (BID) | ORAL | 1 refills | Status: AC

## 2019-08-02 NOTE — Progress Notes (Signed)
Patient ID: [name], female   DOB: [birth year], 74 y.o.   MRN: [id number]         [facility]  Date of Admission:  07/28/2019   Total days of antibiotics 3        Day 2 vancomycin         ASSESSMENT: She has enterococcal prosthetic hip infection.  She will undergo a trial of oral amoxicillin today.  If she tolerates it I will change vancomycin to IV ampicillin and on 6 weeks of therapy.  PLAN: 1. Continue vancomycin pending amoxicillin challenge  Principal Problem:   Failed total hip arthroplasty (HCC)   Scheduled Meds: . amoxicillin  500 mg Oral Once  . apixaban  2.5 mg Oral Q12H  . Chlorhexidine Gluconate Cloth  6 each Topical Daily  . cholecalciferol  2,000 Units Oral Daily  . docusate sodium  100 mg Oral BID  . DULoxetine  60 mg Oral Daily  . ezetimibe  10 mg Oral Daily  . fenofibrate  54 mg Oral Daily  . ferrous sulfate  325 mg Oral TID WC  . folic acid  1 mg Oral Daily  . gabapentin  300 mg Oral QHS  . hydrOXYzine  25 mg Oral Daily  . insulin aspart  0-5 Units Subcutaneous QHS  . insulin aspart  0-9 Units Subcutaneous TID WC  . latanoprost  1 drop Both Eyes QHS  . loratadine  10 mg Oral Daily  . pioglitazone  30 mg Oral QAC breakfast  . senna  1 tablet Oral BID  . topiramate  50 mg Oral QHS   Continuous Infusions: . sodium chloride 150 mL/hr at 07/29/19 2139  . vancomycin 1,000 mg (08/02/19 0149)   PRN Meds:.acetaminophen, alum & mag hydroxide-simeth, diphenhydrAMINE, diphenhydrAMINE, HYDROmorphone (DILAUDID) injection, menthol-cetylpyridinium **OR** phenol, metoCLOPramide **OR** metoCLOPramide (REGLAN) injection, ondansetron **OR** ondansetron (ZOFRAN) IV, oxyCODONE, oxyCODONE, polyethylene glycol, sodium chloride flush, SUMAtriptan   SUBJECTIVE: She had right total hip arthroplasty in December 2018 and has had severe right hip pain since.  An MRI last November showed inflammation and possible loosening around the femoral  component.  She underwent revision arthroplasty 3 days ago where Enterococcus was isolated.  Review of Systems: Review of Systems  Constitutional: Negative for chills, diaphoresis and fever.  Gastrointestinal: Negative for abdominal pain, diarrhea, nausea and vomiting.  Musculoskeletal: Positive for joint pain.    Allergies  Allergen Reactions  . Penicillins Itching and Swelling    Has patient had a PCN reaction causing immediate rash, facial/tongue/throat swelling, SOB or lightheadedness with hypotension: Yes Has patient had a PCN reaction causing severe rash involving mucus membranes or skin necrosis: No Has patient had a PCN reaction that required hospitalization No Has patient had a PCN reaction occurring within the last 10 years: No If all of the above answers are "NO", then may proceed with Cephalosporin use.   . Sulfa Antibiotics Other (See Comments)    Don't remember     OBJECTIVE: Vitals:   08/01/19 0419 08/01/19 1753 08/01/19 1934 08/02/19 0500  BP: (!) 130/52 (!) 123/46 (!) 137/48 (!) 127/98  Pulse: 68 75 78 79  Resp: 18 18 18 17   Temp: 98.4 F (36.9 C) 98.5 F (36.9 C) 98.1 F (36.7 C) 98.2 F (36.8 C)  TempSrc: Oral Oral  Oral  SpO2: 96% 98% 98% 99%  Weight:      Height:       Body mass index is 37.11 kg/m.  Physical Exam  Constitutional:      Comments: She was asleep but aroused easily.  She appears uncomfortable due to pain but otherwise in no distress  Cardiovascular:     Rate and Rhythm: Normal rate and regular rhythm.     Heart sounds: No murmur heard.   Pulmonary:     Effort: Pulmonary effort is normal.     Breath sounds: Normal breath sounds.  Musculoskeletal:     Comments: Right hip dressed.  Skin:    Comments: New left arm PICC.  Psychiatric:        Mood and Affect: Mood normal.     Lab Results Lab Results  Component Value Date   WBC 9.2 08/01/2019   HGB 7.7 (L) 08/01/2019   HCT 24.4 (L) 08/01/2019   MCV 99.2 08/01/2019   PLT 316  08/01/2019    Lab Results  Component Value Date   CREATININE 0.77 08/02/2019   BUN 11 08/02/2019   NA 138 08/02/2019   K 3.3 (L) 08/02/2019   CL 106 08/02/2019   CO2 21 (L) 08/02/2019    Lab Results  Component Value Date   ALT 14 07/20/2019   AST 35 07/20/2019   ALKPHOS 89 07/20/2019   BILITOT 1.4 (H) 07/20/2019     Microbiology: Recent Results (from the past 240 hour(s))  SARS CORONAVIRUS 2 (TAT 6-24 HRS) Nasopharyngeal Nasopharyngeal Swab     Status: None   Collection Time: 07/24/19 10:52 AM   Specimen: Nasopharyngeal Swab  Result Value Ref Range Status   SARS Coronavirus 2 NEGATIVE NEGATIVE Final    Comment: (NOTE) SARS-CoV-2 target nucleic acids are NOT DETECTED.  The SARS-CoV-2 RNA is generally detectable in upper and lower respiratory specimens during the acute phase of infection. Negative results do not preclude SARS-CoV-2 infection, do not rule out co-infections with other pathogens, and should not be used as the sole basis for treatment or other patient management decisions. Negative results must be combined with clinical observations, patient history, and epidemiological information. The expected result is Negative.  Fact Sheet for Patients: HairSlick.no  Fact Sheet for Healthcare Providers: quierodirigir.com  This test is not yet approved or cleared by the [place] FDA and  has been authorized for detection and/or diagnosis of SARS-CoV-2 by FDA under an Emergency Use Authorization (EUA). This EUA will remain  in effect (meaning this test can be used) for the duration of the COVID-19 declaration under Se ction 564(b)(1) of the Act, 21 U.S.C. section 360bbb-3(b)(1), unless the authorization is terminated or revoked sooner.  Performed at [facility], [address] [place], [place] [address]   Aerobic/Anaerobic Culture (surgical/deep wound)     Status: None   Collection Time: 07/28/19   5:01 PM   Specimen: PATH Other; Body Fluid  Result Value Ref Range Status   Specimen Description HIP  Final   Special Requests RIGHT FEMORAL INTERFACE  Final   Gram Stain   Final    FEW WBC PRESENT,BOTH PMN AND MONONUCLEAR NO ORGANISMS SEEN    Culture   Final    RARE ENTEROCOCCUS FAECALIS NO ANAEROBES ISOLATED Performed at [facility], [address], [place], [place] [address]    Report Status 08/02/2019 FINAL  Final   Organism ID, Bacteria ENTEROCOCCUS FAECALIS  Final      Susceptibility   Enterococcus faecalis - MIC*    LINEZOLID 2 SENSITIVE Sensitive     * RARE ENTEROCOCCUS FAECALIS    Cliffton Asters, MD [facility] [phone] pager   [phone] cell 08/02/2019, 12:17 PM

## 2019-08-02 NOTE — Progress Notes (Signed)
Pharmacy Penicillin Allergy Assessment  History of Penicillin Allergy : [name] reports that she had a childhood allergy to penicillin and does not remember much about it. She reports that she has had amoxicillin numerous times since childhood with no reaction and believes that she may no longer be allergic. I recommended that we give her a 1 time dose of amoxicillin while in the hospital to make sure she tolerates it and then remove the allergy.   I discussed this plan with both [name] and her nurse, [name].   Plan Amoxicilln 500 mg X 1 this afternoon at 12:15 RN will monitor vital signs every 15 minutes for ~ 1 hour Will plan to remove allergy if [name] tolerates this challenge  [name], PharmD, BCPS, BCIDP Infectious Diseases Clinical Pharmacist Phone: [phone] 08/02/2019 11:19 AM

## 2019-08-02 NOTE — TOC Initial Note (Addendum)
Transition of Care [facility]) - Initial/Assessment Note    Patient Details  Name: [name] MRN: [id number] Date of Birth: July 23, 1945  Transition [facility]) CM/SW Contact:    Kingsley Plan, RN Phone Number: 08/02/2019, 12:51 PM  Clinical Narrative:                 Discussed discharge planning with patient, and  Dr Linna Caprice at bedside with patient's daughter [name] on speaker phone.   Patient and daughter in agreement with SNF at discharge. Patient has Medicare.gov list of bed offers at bedside. Sarah aware. Per [name] , patient's other daughter will be at hospital to visit shortly. NCM will give patient and daughters time to review bed offers and will check back this afternoon for decision. Patient and [name] aware patient may be medically ready for discharge to SNF tomorrow. Decision will be needed today . TOC team will work on English as a second language teacher.   NCM updated [place] with [facility] and [place] with [place].   Update 1538 NCM checked back with patient. She does want [facility]  . NCM spoke to [place] at [facility] regarding possible admission tomorrow on IV ABX. Janie would like NCM to call in am regarding IV ABX and bed availability  Expected Discharge Plan: Skilled Nursing Facility Barriers to Discharge: Continued Medical Work up   Patient Goals and CMS Choice Patient states their goals for this hospitalization and ongoing recovery are:: to go to SNF CMS Medicare.gov Compare Post Acute Care list provided to:: Patient Choice offered to / list presented to : Patient  Expected Discharge Plan and Services Expected Discharge Plan: Skilled Nursing Facility   Discharge Planning Services: CM Consult Post Acute Care Choice: Home Health Living arrangements for the past 2 months: Single Family Home                 DME Arranged: Gel overlay, Hospital bed, Lightweight manual wheelchair with seat cushion DME Agency: [facility] Date DME Agency Contacted: 07/23/19 Time DME  Agency Contacted: 1155 Representative spoke with at DME Agency: [place] HH Arranged: PT, OT, Nurse's Aide HH Agency: [facility] Date [facility] Agency Contacted: 07/30/19 Time HH Agency Contacted: 1148 Representative spoke with at [facility] Agency: [place] Both  Prior Living Arrangements/Services Living arrangements for the past 2 months: Single Family Home Lives with:: Adult Children Patient language and need for interpreter reviewed:: Yes        Need for Family Participation in Patient Care: Yes (Comment) Care giver support system in place?: Yes (comment)   Criminal Activity/Legal Involvement Pertinent to Current Situation/Hospitalization: No - Comment as needed  Activities of Daily Living      Permission Sought/Granted   Permission granted to share information with : Yes, Verbal Permission Granted  Share Information with NAME: Geraldine Sandberg daughter           Emotional Assessment Appearance:: Appears stated age Attitude/Demeanor/Rapport: Engaged Affect (typically observed): Accepting Orientation: : Oriented to Self, Oriented to Place, Oriented to  Time, Oriented to Situation Alcohol / Substance Use: Not Applicable Psych Involvement: No (comment)  Admission diagnosis:  Failed total hip arthroplasty (HCC) [N23.557D, Z96.649] Patient Active Problem List   Diagnosis Date Noted  . 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 Acute deep vein thrombosis (DVT) of right lower extremity (HCC) 04/01/2017  . Epistaxis 03/14/2017  . 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 Avascular necrosis of bone of  hip, right ([place]) 12/18/2016  . Trochanteric bursitis, right hip 11/04/2016  . Pain of right hip joint 11/04/2016  . S/P lumbar spinal fusion 07/21/2015   PCP:  Leighton Ruff, MD Pharmacy:   [id number] - [place], [place] - [address] [place] [address] Phone:  [phone] Fax: [phone]     Social Determinants of Health (SDOH) Interventions    Readmission Risk Interventions No flowsheet data found.

## 2019-08-02 NOTE — Anesthesia Postprocedure Evaluation (Signed)
Anesthesia Post Note  Patient: [name]  Procedure(s) Performed: TOTAL HIP REVISION, posterior approach femoal comp (Right Hip)     Anesthesia Post Evaluation No complications documented.  Last Vitals:  Vitals:   08/01/19 1934 08/02/19 0500  BP: (!) 137/48 (!) 127/98  Pulse: 78 79  Resp: 18 17  Temp: 36.7 C 36.8 C  SpO2: 98% 99%    Last Pain:  Vitals:   08/02/19 0500  TempSrc: Oral  PainSc:                  Jamason Peckham

## 2019-08-02 NOTE — Progress Notes (Signed)
Subjective:  Patient reports pain as moderate to severe.  Denies N/V/CP/SOB. C/o LBP. Complains of paresthesias of right hand, long, ring, and small fingers.   Objective:   VITALS:   Vitals:   08/01/19 0419 08/01/19 1753 08/01/19 1934 08/02/19 0500  BP: (!) 130/52 (!) 123/46 (!) 137/48 (!) 127/98  Pulse: 68 75 78 79  Resp: 18 18 18 17   Temp: 98.4 F (36.9 C) 98.5 F (36.9 C) 98.1 F (36.7 C) 98.2 F (36.8 C)  TempSrc: Oral Oral  Oral  SpO2: 96% 98% 98% 99%  Weight:      Height:        NAD ABD soft Sensation intact distally Intact pulses distally Dorsiflexion/Plantar flexion intact Incision: dressing C/D/I Compartment soft VAC intact with good seal Abduction brace in place  Lab Results  Component Value Date   WBC 9.2 08/01/2019   HGB 7.7 (L) 08/01/2019   HCT 24.4 (L) 08/01/2019   MCV 99.2 08/01/2019   PLT 316 08/01/2019   BMET    Component Value Date/Time   NA 138 08/02/2019 0456   K 3.3 (L) 08/02/2019 0456   CL 106 08/02/2019 0456   CO2 21 (L) 08/02/2019 0456   GLUCOSE 153 (H) 08/02/2019 0456   BUN 11 08/02/2019 0456   CREATININE 0.77 08/02/2019 0456   CALCIUM 9.6 08/02/2019 0456   GFRNONAA >60 08/02/2019 0456   GFRAA >60 08/02/2019 0456   Recent Results (from the past 240 hour(s))  SARS CORONAVIRUS 2 (TAT 6-24 HRS) Nasopharyngeal Nasopharyngeal Swab     Status: None   Collection Time: 07/24/19 10:52 AM   Specimen: Nasopharyngeal Swab  Result Value Ref Range Status   SARS Coronavirus 2 NEGATIVE NEGATIVE Final    Comment: (NOTE) SARS-CoV-2 target nucleic acids are NOT DETECTED.  The SARS-CoV-2 RNA is generally detectable in upper and lower respiratory specimens during the acute phase of infection. Negative results do not preclude SARS-CoV-2 infection, do not rule out co-infections with other pathogens, and should not be used as the sole basis for treatment or other patient management decisions. Negative results must be combined with clinical  observations, patient history, and epidemiological information. The expected result is Negative.  Fact Sheet for Patients: 07/26/19  Fact Sheet for Healthcare Providers: HairSlick.no  This test is not yet approved or cleared by the [place] FDA and  has been authorized for detection and/or diagnosis of SARS-CoV-2 by FDA under an Emergency Use Authorization (EUA). This EUA will remain  in effect (meaning this test can be used) for the duration of the COVID-19 declaration under Se ction 564(b)(1) of the Act, 21 U.S.C. section 360bbb-3(b)(1), unless the authorization is terminated or revoked sooner.  Performed at [facility], [address] [place], [place]   Aerobic/Anaerobic Culture (surgical/deep wound)     Status: None   Collection Time: 07/28/19  5:01 PM   Specimen: PATH Other; Body Fluid  Result Value Ref Range Status   Specimen Description HIP  Final   Special Requests RIGHT FEMORAL INTERFACE  Final   Gram Stain   Final    FEW WBC PRESENT,BOTH PMN AND MONONUCLEAR NO ORGANISMS SEEN    Culture   Final    RARE ENTEROCOCCUS FAECALIS NO ANAEROBES ISOLATED Performed at [facility], [address] [place], [place]    Report Status 08/02/2019 FINAL  Final   Organism ID, Bacteria ENTEROCOCCUS FAECALIS  Final      Susceptibility   Enterococcus  faecalis - MIC*    LINEZOLID 2 SENSITIVE Sensitive     * RARE ENTEROCOCCUS FAECALIS    Assessment/Plan: 5 Days Post-Op   Principal Problem:   Failed total hip arthroplasty (HCC)   TDWB RLE with walker No active abduction for 6 weeks Posterior hip precautions Abduction brace at all times except for hygiene DVT ppx: apixaban, SCDs, TEDS PO pain control PT/OT DM2: continue home meds, sliding scale PRN ABLA: asymptomatic, transfuse HgB < 7 Dispo: SNF placement pending, ID arranging ABX regimen, convert house VAC to portable  prevena unit upon d/c   Darrick Grinder 08/02/2019, 12:27 PM   Samson Frederic, MD [phone] [facility] is now [facility]  [place] [address], [place], [place] [address] Phone: [phone] [email or web address] Facebook  [employer]

## 2019-08-02 NOTE — Care Management (Addendum)
    Durable Medical Equipment  (From admission, onward)         Start     Ordered   07/30/19 1125  For home use only DME Hospital bed  Once       Question Answer Comment  Length of Need Lifetime   Patient has (list medical condition): Failed hip replacement   The above medical condition requires: Patient requires the ability to reposition frequently   Head must be elevated greater than: 30 degrees   Bed type Semi-electric   Trapeze Bar Yes   Support Surface: Gel Overlay      07/30/19 1127   07/30/19 1124  For home use only DME lightweight manual wheelchair with seat cushion  Once       Comments: Patient suffers from  hip surgery  which impairs their ability to perform daily activities like ADL's in the home.  A  walker will not resolve  issue with performing activities of daily living. A wheelchair will allow patient to safely perform daily activities. Patient is not able to propel themselves in the home using a standard weight wheelchair due to weakness:[id number]  Patient can self propel in the lightweight wheelchair. Length of need  is lifetime. Accessories: elevating leg rests (ELRs), wheel locks, extensions and anti-tippers.   07/30/19 1127

## 2019-08-02 NOTE — Plan of Care (Signed)
  Problem: Education: Goal: Knowledge of General Education information will improve Description: Including pain rating scale, medication(s)/side effects and non-pharmacologic comfort measures Outcome: Progressing   Problem: Clinical Measurements: Goal: Ability to maintain clinical measurements within normal limits will improve Outcome: Progressing Goal: Will remain free from infection Outcome: Progressing   

## 2019-08-02 NOTE — Plan of Care (Signed)
  Problem: Education: Goal: Knowledge of General Education information will improve Description Including pain rating scale, medication(s)/side effects and non-pharmacologic comfort measures Outcome: Progressing   

## 2019-08-03 LAB — GLUCOSE, CAPILLARY
Glucose-Capillary: 190 mg/dL — ABNORMAL HIGH (ref 70–99)
Glucose-Capillary: 135 mg/dL — ABNORMAL HIGH (ref 70–99)
Glucose-Capillary: 114 mg/dL — ABNORMAL HIGH (ref 70–99)
Glucose-Capillary: 152 mg/dL — ABNORMAL HIGH (ref 70–99)

## 2019-08-03 MED ORDER — AMPICILLIN IV (FOR PTA / DISCHARGE USE ONLY): 12.0000 g | INTRAVENOUS | 0 refills | Status: DC

## 2019-08-03 MED ORDER — SODIUM CHLORIDE 0.9 % IV SOLN
2.0000 g | INTRAVENOUS | Status: DC
  Administered 2019-08-03 – 2019-08-04 (×7): 2 g via INTRAVENOUS
  Filled 2019-08-03 (×5): qty 2000
  Filled 2019-08-03: qty 2
  Filled 2019-08-03 (×5): qty 2000
  Filled 2019-08-03: qty 2

## 2019-08-03 MED ORDER — SODIUM CHLORIDE 0.9 % IV SOLN: 2.0000 g | INTRAVENOUS | 0 refills | Status: DC

## 2019-08-03 NOTE — Care Management (Addendum)
Insurance authorization pending for SNF. [facility] has a bed for patient. Patient has had both covid vaccinations , per Wille Celeste at [facility] patient does not need another covid test.    Will need OPAT script once antibiotic determination has been made.   Sarah sent picture of patient's covid vaccination card to [facility]. NCM forwarded picture to Janie at [facility]   Updated daughter [name] , awaiting PT note from today to fax to insurance. Janie with [facility] aware Ampicillin 12 gram daily as a continuous infusion end date 09/08/19    Ronny Flurry RN

## 2019-08-03 NOTE — Progress Notes (Signed)
Physical Therapy Treatment Patient Details Name: [name] MRN: [id number] DOB: [birth year] Today's Date: 08/03/2019    History of Present Illness Pt is a 74 y.o. 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 PMH includes TIA, RA, HA, DM II, R THA (2018).    PT Comments    Pt in bed upon entry into room. Pt required up to max(A) +2 for bed mobility and transfers. Despite continuous verbal and tactile education and reenforcement of precautions pt still struggles to maintain and remember precautions with mobility. Educated pt on and practiced LE therex to complete in the bed and at EOB, while maintaining precautions. Will continue to follow acutely.    Follow Up Recommendations  SNF;Supervision/Assistance - 24 hour     Equipment Recommendations  Other (comment) (tbd)    Recommendations for Other Services       Precautions / Restrictions Precautions Precautions: Posterior Hip;Fall Precaution Comments: reviewed throughout, pt struggles to maintain precautions despite continued reinforcement. Required Braces or Orthoses: Other Brace Other Brace: R hip abduction brace Restrictions Weight Bearing Restrictions: Yes RLE Weight Bearing: Touchdown weight bearing    Mobility  Bed Mobility Overal bed mobility: Needs Assistance Bed Mobility: Supine to Sit;Sit to Supine     Supine to sit: +2 for physical assistance;HOB elevated;Mod assist Sit to supine: Max assist;+2 for physical assistance   General bed mobility comments: Cues for sequencing, pt able to assist with LLE and BUE for trunk elevation; modA+2 to assist trunk elevation, pt using LLE to assist hips to EOB. Pt required max(A) support of LE and trunk for sit to supine transfer.  Transfers Overall transfer level: Needs assistance Equipment used: Rolling walker (2 wheeled) Transfers: Sit to/from Stand Sit to Stand: +2 physical assistance;From elevated  surface;Max assist         General transfer comment: Pt required +2 assistance max(A) for sit to stand and to maintain precautions, pt required tactile and verbal cues to maintain precautions, despite extensive education, tactile and verbal cues pt intermittantly could not maintain precautions with sit to stand x2.  Ambulation/Gait Ambulation/Gait assistance: +2 physical assistance;+2 safety/equipment;Mod assist Gait Distance (Feet): 1 Feet Assistive device: Rolling walker (2 wheeled)       General Gait Details: Pt able to scooted LLE to side and move 1' to move up toward the head of the bed.   Stairs             Wheelchair Mobility    Modified Rankin (Stroke Patients Only)       Balance Overall balance assessment: Needs assistance Sitting-balance support: Bilateral upper extremity supported;Feet supported Sitting balance-Leahy Scale: Fair Sitting balance - Comments: Prolonged sitting EOB for RLE AROM/PROM and brace readjustment, tolerated well with 1-2 UE support progressing from min(A) to supervision assistance.     Standing balance-Leahy Scale: Poor Standing balance comment: Reliant on UE support and external assist; difficulty maintaining RLE TDWB precautions                            Cognition Arousal/Alertness: Awake/alert Behavior During Therapy: WFL for tasks assessed/performed Overall Cognitive Status: Within Functional Limits for tasks assessed Area of Impairment: Attention;Safety/judgement;Memory;Problem solving                   Current Attention Level: Selective Memory: Decreased recall of precautions;Decreased short-term memory   Safety/Judgement: Decreased awareness of safety;Decreased awareness of deficits   Problem  Solving: Slow processing;Decreased initiation;Requires verbal cues;Requires tactile cues General Comments: Pt required increase verbal and tactile cueing to maintain precautions throughout session, pt required  repeated teaching of precautions and could not remember precautions during session. Pt reported she only knew what day it was because she asked someone this morning.      Exercises Total Joint Exercises Ankle Circles/Pumps: AROM;Both;20 reps;Seated;Supine Quad Sets: AROM;5 reps;Both;Supine Straight Leg Raises: AROM;Left;10 reps;Supine (knee flexion initally) Long Arc Quad: 5 reps;Both;Seated;AROM (within tolerance and precautions on RLE)    General Comments General comments (skin integrity, edema, etc.): Adjusted hip abduction brace due to noted ares of skin integrity concern on back, less concerning areas of skin integ on thigh and leg. Assisted pt with reajustment of the brace for comfort to decrease irritation of skin integrity. Nurse notified.      Pertinent Vitals/Pain Pain Score: 9  Pain Location: RLE (reports pain is between 7-9 throughout session) Pain Descriptors / Indicators: Discomfort;Dull;Grimacing;Guarding Pain Intervention(s): Limited activity within patient's tolerance;Monitored during session;Repositioned    Home Living                      Prior Function            PT Goals (current goals can now be found in the care plan section) Acute Rehab PT Goals Patient Stated Goal: return to independence PT Goal Formulation: With patient Time For Goal Achievement: 08/12/19 Potential to Achieve Goals: Good Progress towards PT goals: Progressing toward goals    Frequency    Min 3X/week      PT Plan Current plan remains appropriate    Co-evaluation              AM-PAC PT "6 Clicks" Mobility   Outcome Measure  Help needed turning from your back to your side while in a flat bed without using bedrails?: A Lot Help needed moving from lying on your back to sitting on the side of a flat bed without using bedrails?: A Lot Help needed moving to and from a bed to a chair (including a wheelchair)?: Total Help needed standing up from a chair using your  arms (e.g., wheelchair or bedside chair)?: Total Help needed to walk in hospital room?: Total Help needed climbing 3-5 steps with a railing? : Total 6 Click Score: 8    End of Session Equipment Utilized During Treatment: Gait belt Activity Tolerance: Patient tolerated treatment well;Patient limited by pain Patient left: in bed;with call bell/phone within reach;with bed alarm set Nurse Communication: Mobility status PT Visit Diagnosis: Unsteadiness on feet (R26.81);Other abnormalities of gait and mobility (R26.89);Difficulty in walking, not elsewhere classified (R26.2);Pain Pain - Right/Left: Right Pain - part of body: Hip     Time: 1950-9326 PT Time Calculation (min) (ACUTE ONLY): 40 min  Charges:  $Therapeutic Exercise: 8-22 mins $Therapeutic Activity: 23-37 mins                     [employer] SPT 08/03/2019    [name] 08/03/2019, 3:53 PM

## 2019-08-03 NOTE — Plan of Care (Signed)
  Problem: Education: Goal: Knowledge of General Education information will improve Description Including pain rating scale, medication(s)/side effects and non-pharmacologic comfort measures Outcome: Progressing   

## 2019-08-03 NOTE — Progress Notes (Signed)
PHARMACY CONSULT NOTE FOR:  OUTPATIENT  PARENTERAL ANTIBIOTIC THERAPY (OPAT)  Indication: Enterococcal prosthetic hip infection Regimen: Ampicillin 12 gm every 24 hours as a continuous infusion End date: 09/08/2019  IV antibiotic discharge orders are pended. To discharging provider:  please sign these orders via discharge navigator,  Select New Orders & click on the button choice - Manage This Unsigned Work.     Thank you for allowing pharmacy to be a part of this patient's care.  Sharin Mons, PharmD, BCPS, BCIDP Infectious Diseases Clinical Pharmacist Phone: [phone] 08/03/2019, 11:21 AM

## 2019-08-03 NOTE — Progress Notes (Signed)
Patient ID: [name], female   DOB: 10-23-1945, 74 y.o.   MRN: [id number]         [facility]  Date of Admission:  07/28/2019   Total days of antibiotics 4        Day 3 vancomycin         ASSESSMENT: Plan on 6 weeks of IV ampicillin for her right hip enterococcal prosthetic hip infection.    PLAN: 1. Change vancomycin to IV ampicillin 2. I have arranged follow-up in my clinic and will sign off now  Diagnosis: Prosthetic hip infection  Culture Result: Enterococcus  Allergies  Allergen Reactions  . Penicillins Itching and Swelling    Has patient had a PCN reaction causing immediate rash, facial/tongue/throat swelling, SOB or lightheadedness with hypotension: Yes Has patient had a PCN reaction causing severe rash involving mucus membranes or skin necrosis: No Has patient had a PCN reaction that required hospitalization No Has patient had a PCN reaction occurring within the last 10 years: No If all of the above answers are "NO", then may proceed with Cephalosporin use.   . Sulfa Antibiotics Other (See Comments)    Don't remember     OPAT Orders Discharge antibiotics to be given via PICC line Discharge antibiotics: Per pharmacy protocol ampicillin Aim for Vancomycin trough 15-20 or AUC 400-550 (unless otherwise indicated) Duration: 6 weeks End Date: 09/08/2019  [facility] Care Per Protocol:  Home health RN for IV administration and teaching; PICC line care and labs.    Labs weekly while on IV antibiotics: _x_ CBC with differential _x_ BMP __ CMP _x_ CRP _x_ ESR __ Vancomycin trough __ CK  _x_ Please pull PIC at completion of IV antibiotics __ Please leave PIC in place until doctor has seen patient or been notified  Fax weekly labs to [phone]  Clinic Follow Up Appt: 08/25/2019  Principal Problem:   Failed total hip arthroplasty ([place])   Scheduled Meds: . apixaban  2.5 mg Oral Q12H  . Chlorhexidine Gluconate Cloth  6  each Topical Daily  . cholecalciferol  2,000 Units Oral Daily  . docusate sodium  100 mg Oral BID  . DULoxetine  60 mg Oral Daily  . ezetimibe  10 mg Oral Daily  . fenofibrate  54 mg Oral Daily  . ferrous sulfate  325 mg Oral TID WC  . folic acid  1 mg Oral Daily  . gabapentin  300 mg Oral QHS  . hydrOXYzine  25 mg Oral Daily  . insulin aspart  0-5 Units Subcutaneous QHS  . insulin aspart  0-9 Units Subcutaneous TID WC  . latanoprost  1 drop Both Eyes QHS  . loratadine  10 mg Oral Daily  . pioglitazone  30 mg Oral QAC breakfast  . senna  1 tablet Oral BID  . topiramate  50 mg Oral QHS   Continuous Infusions: . sodium chloride 150 mL/hr at 07/29/19 2139  . ampicillin (OMNIPEN) IV     PRN Meds:.acetaminophen, alum & mag hydroxide-simeth, diphenhydrAMINE, diphenhydrAMINE, menthol-cetylpyridinium **OR** phenol, metoCLOPramide **OR** metoCLOPramide (REGLAN) injection, ondansetron **OR** ondansetron (ZOFRAN) IV, oxyCODONE, oxyCODONE, polyethylene glycol, sodium chloride flush, SUMAtriptan   SUBJECTIVE: She tolerated her test dose of amoxicillin yesterday without incident. Her pain is under better control.  Review of Systems: Review of Systems  Constitutional: Negative for chills, diaphoresis and fever.  Gastrointestinal: Negative for abdominal pain, diarrhea, nausea and vomiting.  Musculoskeletal: Positive for joint pain.    Allergies  Allergen Reactions  .  Penicillins Itching and Swelling    Has patient had a PCN reaction causing immediate rash, facial/tongue/throat swelling, SOB or lightheadedness with hypotension: Yes Has patient had a PCN reaction causing severe rash involving mucus membranes or skin necrosis: No Has patient had a PCN reaction that required hospitalization No Has patient had a PCN reaction occurring within the last 10 years: No If all of the above answers are "NO", then may proceed with Cephalosporin use.   . Sulfa Antibiotics Other (See Comments)    Don't  remember     OBJECTIVE: Vitals:   08/02/19 2010 08/02/19 2021 08/03/19 0501 08/03/19 0512  BP:  (!) 124/49 (!) 126/52   Pulse:  77 67   Resp: 18 20 16 16   Temp:  98.5 F (36.9 C) 98 F (36.7 C)   TempSrc:  Oral Oral   SpO2:  100% 98%   Weight:      Height:       Body mass index is 37.11 kg/m.  Physical Exam Constitutional:      Comments: She is sitting up in a chair. Her spirits are good.  Cardiovascular:     Rate and Rhythm: Normal rate and regular rhythm.     Heart sounds: No murmur heard.   Pulmonary:     Effort: Pulmonary effort is normal.     Breath sounds: Normal breath sounds.  Musculoskeletal:     Comments: She has a brace on her right hip. Her operative drain remains in place.  Skin:    Comments: New left arm PICC.  Psychiatric:        Mood and Affect: Mood normal.     Lab Results Lab Results  Component Value Date   WBC 9.2 08/01/2019   HGB 7.7 (L) 08/01/2019   HCT 24.4 (L) 08/01/2019   MCV 99.2 08/01/2019   PLT 316 08/01/2019    Lab Results  Component Value Date   CREATININE 0.77 08/02/2019   BUN 11 08/02/2019   NA 138 08/02/2019   K 3.3 (L) 08/02/2019   CL 106 08/02/2019   CO2 21 (L) 08/02/2019    Lab Results  Component Value Date   ALT 14 07/20/2019   AST 35 07/20/2019   ALKPHOS 89 07/20/2019   BILITOT 1.4 (H) 07/20/2019     Microbiology: Recent Results (from the past 240 hour(s))  Aerobic/Anaerobic Culture (surgical/deep wound)     Status: None (Preliminary result)   Collection Time: 07/28/19  5:01 PM   Specimen: PATH Other; Body Fluid  Result Value Ref Range Status   Specimen Description HIP  Final   Special Requests RIGHT FEMORAL INTERFACE  Final   Gram Stain   Final    FEW WBC PRESENT,BOTH PMN AND MONONUCLEAR NO ORGANISMS SEEN    Culture   Final    RARE ENTEROCOCCUS FAECALIS NO ANAEROBES ISOLATED Performed at [facility], [address], [place], [place] [address]    Report Status PENDING  Incomplete   Organism  ID, Bacteria ENTEROCOCCUS FAECALIS  Final      Susceptibility   Enterococcus faecalis - MIC*    LINEZOLID 2 SENSITIVE Sensitive     * RARE ENTEROCOCCUS FAECALIS    Michel Bickers, MD [place] [facility] [phone] pager   [phone] cell 08/03/2019, 11:19 AM

## 2019-08-03 NOTE — Progress Notes (Signed)
    Subjective:  Patient reports pain as moderate today. She states that she is starting to feel better. Denies N/V/CP/SOB.   Objective:   VITALS:   Vitals:   08/02/19 2010 08/02/19 2021 08/03/19 0501 08/03/19 0512  BP:  (!) 124/49 (!) 126/52   Pulse:  77 67   Resp: 18 20 16 16   Temp:  98.5 F (36.9 C) 98 F (36.7 C)   TempSrc:  Oral Oral   SpO2:  100% 98%   Weight:      Height:        NAD ABD soft Sensation intact distally Intact pulses distally Dorsiflexion/Plantar flexion intact Incision: dressing C/D/I Compartment soft VAC intact with good seal Abduction brace in place  Lab Results  Component Value Date   WBC 9.2 08/01/2019   HGB 7.7 (L) 08/01/2019   HCT 24.4 (L) 08/01/2019   MCV 99.2 08/01/2019   PLT 316 08/01/2019   BMET    Component Value Date/Time   NA 138 08/02/2019 0456   K 3.3 (L) 08/02/2019 0456   CL 106 08/02/2019 0456   CO2 21 (L) 08/02/2019 0456   GLUCOSE 153 (H) 08/02/2019 0456   BUN 11 08/02/2019 0456   CREATININE 0.77 08/02/2019 0456   CALCIUM 9.6 08/02/2019 0456   GFRNONAA >60 08/02/2019 0456   GFRAA >60 08/02/2019 0456   Recent Results (from the past 240 hour(s))  Aerobic/Anaerobic Culture (surgical/deep wound)     Status: None (Preliminary result)   Collection Time: 07/28/19  5:01 PM   Specimen: PATH Other; Body Fluid  Result Value Ref Range Status   Specimen Description HIP  Final   Special Requests RIGHT FEMORAL INTERFACE  Final   Gram Stain   Final    FEW WBC PRESENT,BOTH PMN AND MONONUCLEAR NO ORGANISMS SEEN    Culture   Final    RARE ENTEROCOCCUS FAECALIS NO ANAEROBES ISOLATED Performed at [facility], [address], [place], [place]    Report Status PENDING  Incomplete   Organism ID, Bacteria ENTEROCOCCUS FAECALIS  Final      Susceptibility   Enterococcus faecalis - MIC*    LINEZOLID 2 SENSITIVE Sensitive     * RARE ENTEROCOCCUS FAECALIS    Assessment/Plan: 6 Days Post-Op   Principal  Problem:   Failed total hip arthroplasty ([facility])   TDWB RLE with walker No active abduction for 6 weeks Posterior hip precautions Abduction brace at all times except for hygiene DVT ppx: apixaban, SCDs, TEDS PO pain control PT/OT DM2: continue home meds, sliding scale PRN ABLA: asymptomatic, transfuse HgB < 7 Dispo: SNF placement pending, ID recommending Ampicillin for ABX, convert house VAC to portable prevena unit upon d/c   [id number] 08/03/2019, 11:58 AM   08/05/2019, MD [phone] [facility] is now [facility]  [place] [address], [place], [place] Phone: [phone] [email or web address] Facebook  [phone]

## 2019-08-04 LAB — GLUCOSE, CAPILLARY
Glucose-Capillary: 107 mg/dL — ABNORMAL HIGH (ref 70–99)
Glucose-Capillary: 126 mg/dL — ABNORMAL HIGH (ref 70–99)

## 2019-08-04 MED ORDER — HEPARIN SOD (PORK) LOCK FLUSH 100 UNIT/ML IV SOLN
250.0000 [IU] | INTRAVENOUS | Status: AC | PRN
  Administered 2019-08-04: 250 [IU]
  Filled 2019-08-04: qty 2.5

## 2019-08-04 MED ORDER — HEPARIN SOD (PORK) LOCK FLUSH 100 UNIT/ML IV SOLN
250.0000 [IU] | INTRAVENOUS | Status: DC | PRN
Start: 2019-08-04 — End: 2019-08-04

## 2019-08-04 NOTE — Progress Notes (Signed)
VAST consulted to flush and heparin lock PICC line for pt's discharge. Arrived at pt's bedside and Ampicillin had just been hung. Explained to pt I would return in approx 30 mins to flush line.

## 2019-08-04 NOTE — TOC Transition Note (Signed)
Transition of Care [facility]) - CM/SW Discharge Note   Patient Details  Name: [name] MRN: [id number] Date of Birth: 1945/06/13  Transition of Care [facility]) CM/SW Contact:  Doy Hutching, LCSW Phone Number: 08/04/2019, 10:31 AM   Clinical Narrative:    [facility] received- ref #[id number], approved 6/30-7/2.  Admissions liaison aware, scripts signed on chart.  RNCM Herbert Seta has spoken with family.    Final next level of care: Skilled Nursing Facility Barriers to Discharge: Barriers Resolved   Patient Goals and CMS Choice Patient states their goals for this hospitalization and ongoing recovery are:: to go to SNF CMS Medicare.gov Compare Post Acute Care list provided to:: Patient Choice offered to / list presented to : Patient  Discharge Placement   Existing PASRR number confirmed : 07/29/19          Patient chooses bed at: [facility] Patient to be transferred to facility by: PTAR Name of family member notified: RNCM has updated pt daughter [name] via telephone Patient and family notified of of transfer: 08/04/19  Discharge Plan and Services   Discharge Planning Services: CM Consult Post Acute Care Choice: Home Health          DME Arranged: Gel overlay, Hospital bed, Lightweight manual wheelchair with seat cushion DME Agency: [employer] Date DME Agency Contacted: 07/23/19 Time DME Agency Contacted: 1155 Representative spoke with at DME Agency: [facility] HH Arranged: PT, OT, Nurse's Aide HH Agency: [facility] Date [facility] Agency Contacted: 07/30/19 Time HH Agency Contacted: 1148 Representative spoke with at [facility] Agency: [facility] Both   Readmission Risk Interventions No flowsheet data found.

## 2019-08-04 NOTE — TOC Progression Note (Addendum)
Transition of Care [facility]) - Progression Note    Patient Details  Name: [name] MRN: [id number] Date of Birth: Sep 27, 1945  Transition of Care [facility]) CM/SW Contact  Nadene Rubins Adria Devon, RN Phone Number: 08/04/2019, 10:33 AM  Clinical Narrative:     See previous TOC note. NCM spoke to patient, daughters [name] and [name].  Asked bedside nurse Maggie to switch vac to portable prevena. And send the charging cable with her and call report . [phone]    Expected Discharge Plan: Skilled Nursing Facility Barriers to Discharge: Barriers Resolved  Expected Discharge Plan and Services Expected Discharge Plan: Skilled Nursing Facility   Discharge Planning Services: CM Consult Post Acute Care Choice: Home Health Living arrangements for the past 2 months: Single Family Home Expected Discharge Date: 08/04/19               DME Arranged: Gel overlay, Hospital bed, Lightweight manual wheelchair with seat cushion DME Agency: [employer] Date DME Agency Contacted: 07/23/19 Time DME Agency Contacted: 1155 Representative spoke with at DME Agency: [facility] HH Arranged: PT, OT, Nurse's Aide HH Agency: [facility] Date [facility] Agency Contacted: 07/30/19 Time HH Agency Contacted: 1148 Representative spoke with at [facility] Agency: [facility] Both   Social Determinants of Health (SDOH) Interventions    Readmission Risk Interventions No flowsheet data found.

## 2019-08-04 NOTE — Progress Notes (Addendum)
Report given to RN at [facility]. Pt educated on current medications. IV team called to cap PICC line. Pt wound vac switched over to Prevena. Belongings sent with patient. Transport here for discharge.

## 2019-08-04 NOTE — TOC Progression Note (Signed)
Transition of Care [facility]) - Progression Note    Patient Details  Name: [name] MRN: [id number] Date of Birth: [birth year]  Transition of Care [facility]) CM/SW Contact  Nadene Rubins Adria Devon, RN Phone Number: 08/04/2019, 8:28 AM  Clinical Narrative:     Received insurance authorization for SNF. Messaged PA.   Expected Discharge Plan: Skilled Nursing Facility Barriers to Discharge: Continued Medical Work up  Expected Discharge Plan and Services Expected Discharge Plan: Skilled Nursing Facility   Discharge Planning Services: CM Consult Post Acute Care Choice: Home Health Living arrangements for the past 2 months: Single Family Home                 DME Arranged: Gel overlay, Hospital bed, Lightweight manual wheelchair with seat cushion DME Agency: [employer] Date DME Agency Contacted: 07/23/19 Time DME Agency Contacted: 1155 Representative spoke with at DME Agency: [facility] HH Arranged: PT, OT, Nurse's Aide HH Agency: [facility] Date [facility] Agency Contacted: 07/30/19 Time HH Agency Contacted: 1148 Representative spoke with at [facility] Agency: [facility] Both   Social Determinants of Health (SDOH) Interventions    Readmission Risk Interventions No flowsheet data found.

## 2019-08-04 NOTE — Discharge Summary (Signed)
Physician Discharge Summary  Patient ID: [name] No MRN: [id number] DOB/AGE: August 07, 1945 74 y.o.  Admit date: 07/28/2019 Discharge date: 08/04/2019  Admission Diagnoses:  Failed total hip arthroplasty [facility])  Discharge Diagnoses:  Principal Problem:   Failed total hip arthroplasty [facility])   Past Medical History:  Diagnosis Date  . Chronic fatigue   . Depression   . Diabetes mellitus without complication ([place])   . Early cataracts, bilateral   . Fibromyalgia   . Glaucoma   . Headache    Vestibular migraines  . History of bronchitis   . History of pneumonia   . Hypercholesterolemia   . RA (rheumatoid arthritis) ([place])   . RA (rheumatoid arthritis) ([place])   . TIA (transient ischemic attack)   . Vertigo   . Wears glasses     Surgeries: Procedure(s): TOTAL HIP REVISION, posterior approach femoal comp on 07/28/2019   Consultants (if any):   Discharged Condition: Improved  Hospital Course: [name] is an 74 y.o. female who was admitted 07/28/2019 with a diagnosis of Failed total hip arthroplasty ([facility]) and went to the operating room on 07/28/2019 and underwent the above named procedures.    She was given perioperative antibiotics:  Anti-infectives (From admission, onward)   Start     Dose/Rate Route Frequency Ordered Stop   08/03/19 1200  ampicillin (OMNIPEN) 2 g in sodium chloride 0.9 % 100 mL IVPB     Discontinue     2 g 300 mL/hr over 20 Minutes Intravenous Every 4 hours 08/03/19 1006     08/03/19 0000  ampicillin IVPB     Discontinue     12 g Intravenous Every 24 hours 08/03/19 1208 09/08/19 2359   08/03/19 0000  ampicillin 2 g in sodium chloride 0.9 % 100 mL  Status:  Discontinued        2 g Intravenous Every 4 hours 08/03/19 1208 08/03/19    08/02/19 1215  amoxicillin (AMOXIL) capsule 500 mg        500 mg Oral  Once 08/02/19 1114 08/02/19 1308   08/02/19 0045  vancomycin (VANCOCIN) IVPB 1000 mg/200 mL premix  Status:  Discontinued       "Followed by"  Linked Group Details   1,000 mg 200 mL/hr over 60 Minutes Intravenous Every 12 hours 08/01/19 1238 08/03/19 1006   08/01/19 1245  vancomycin (VANCOREADY) IVPB 1500 mg/300 mL       "Followed by" Linked Group Details   1,500 mg 150 mL/hr over 120 Minutes Intravenous  Once 08/01/19 1238 08/01/19 1547   07/31/19 1545  DAPTOmycin (CUBICIN) 800 mg in sodium chloride 0.9 % IVPB  Status:  Discontinued        800 mg 232 mL/hr over 30 Minutes Intravenous Daily 07/31/19 1509 08/01/19 1215   07/31/19 1445  DAPTOmycin (CUBICIN) 800 mg in sodium chloride 0.9 % IVPB  Status:  Discontinued        800 mg 232 mL/hr over 30 Minutes Intravenous Every 48 hours 07/31/19 1352 07/31/19 1509   07/28/19 2330  vancomycin (VANCOCIN) IVPB 1000 mg/200 mL premix        1,000 mg 200 mL/hr over 60 Minutes Intravenous Every 12 hours 07/28/19 2030 07/29/19 0112   07/28/19 1100  clindamycin (CLEOCIN) IVPB 900 mg        900 mg 100 mL/hr over 30 Minutes Intravenous On call to O.R. 07/28/19 1058 07/28/19 1456   07/28/19 1100  vancomycin (VANCOREADY) IVPB 1500 mg/300 mL  1,500 mg 150 mL/hr over 120 Minutes Intravenous On call to O.R. 07/28/19 1058 07/28/19 1323    .  She was given sequential compression devices, early ambulation, and apixaban for DVT prophylaxis.  Postoperatively, she was made touchdown weightbearing right lower extremity.  Posterior hip precautions.  Abduction brace was applied.  Her intraoperative culture grew rare Enterococcus (see culture result below), so infectious disease consultation was obtained.  PICC line was placed.  She was initially started on daptomycin, but this was discontinued due to elevation in CK.  They recommended continuous ampicillin infusion, 12 g per 24 hours for a total of 6 weeks.  She benefited maximally from the hospital stay and there were no complications.    Upon discharge, house [facility] unit was switched to portable Prevena unit.  She will return to the office next Tuesday  for incisional VAC removal.  Touchdown weightbearing right lower extremity.  Posterior hip precautions.  Abduction brace to be worn at all times.  Brace may be removed for daily skin check and hygiene.  Follow up with infectious disease.   Recent vital signs:  Vitals:   08/03/19 2137 08/04/19 0552  BP:  (!) 105/41  Pulse:  64  Resp: 18 17  Temp:  98 F (36.7 C)  SpO2:  100%    Recent laboratory studies:  Lab Results  Component Value Date   HGB 7.7 (L) 08/01/2019   HGB 7.3 (L) 07/31/2019   HGB 7.0 (L) 07/30/2019   Lab Results  Component Value Date   WBC 9.2 08/01/2019   PLT 316 08/01/2019   Lab Results  Component Value Date   INR 1.1 07/20/2019   Lab Results  Component Value Date   NA 138 08/02/2019   K 3.3 (L) 08/02/2019   CL 106 08/02/2019   CO2 21 (L) 08/02/2019   BUN 11 08/02/2019   CREATININE 0.77 08/02/2019   GLUCOSE 153 (H) 08/02/2019    Recent Results (from the past 240 hour(s))  Aerobic/Anaerobic Culture (surgical/deep wound)     Status: None (Preliminary result)   Collection Time: 07/28/19  5:01 PM   Specimen: PATH Other; Body Fluid  Result Value Ref Range Status   Specimen Description HIP  Final   Special Requests RIGHT FEMORAL INTERFACE  Final   Gram Stain   Final    FEW WBC PRESENT,BOTH PMN AND MONONUCLEAR NO ORGANISMS SEEN    Culture   Final    RARE ENTEROCOCCUS FAECALIS NO ANAEROBES ISOLATED Performed at [facility], [address], [place], [place] [address]    Report Status PENDING  Incomplete   Organism ID, Bacteria ENTEROCOCCUS FAECALIS  Final      Susceptibility   Enterococcus faecalis - MIC*    LINEZOLID 2 SENSITIVE Sensitive     * RARE ENTEROCOCCUS FAECALIS      Discharge Medications:   Allergies as of 08/04/2019      Reactions   Sulfa Antibiotics Other (See Comments)   Don't remember       Medication List    STOP taking these medications   aspirin 81 MG chewable tablet   HYDROcodone-acetaminophen 5-325 MG  tablet Commonly known as: NORCO/VICODIN   HYDROcodone-acetaminophen 7.5-325 MG tablet Commonly known as: NORCO   methotrexate 50 MG/2ML injection   Simponi Aria 50 MG/4ML Soln injection Generic drug: golimumab     TAKE these medications   acetaminophen 325 MG tablet Commonly known as: TYLENOL Take 2 tablets (650 mg total) by mouth every 6 (six)  hours as needed for mild pain (pain score 1-3 or temp > 100.5).   ampicillin  IVPB Inject 12 g into the vein daily. As a continuous infusion. Indication:  Enterococcal prosthetic hip infection First Dose: Yes Last Day of Therapy:  09/08/19 Labs - Once weekly:  CBC/D and BMP, Labs - Every other week:  ESR and CRP Method of administration: Ambulatory Pump (Continuous Infusion) Method of administration may be changed at the discretion of home infusion pharmacist based upon assessment of the patient and/or caregiver's ability to self-administer the medication ordered.   apixaban 2.5 MG Tabs tablet Commonly known as: ELIQUIS Take 1 tablet (2.5 mg total) by mouth every 12 (twelve) hours.   cetirizine 10 MG tablet Commonly known as: ZYRTEC Take 10 mg by mouth daily.   docusate sodium 100 MG capsule Commonly known as: COLACE Take 1 capsule (100 mg total) by mouth 2 (two) times daily.   DULoxetine 30 MG capsule Commonly known as: CYMBALTA Take 60 mg by mouth daily.   EpiPen 2-Pak 0.3 mg/0.3 mL Soaj injection Generic drug: EPINEPHrine Inject 0.3 mg into the muscle daily as needed. For anaphylactic allergic reaction   ezetimibe 10 MG tablet Commonly known as: ZETIA Take 10 mg by mouth daily.   fenofibrate 54 MG tablet Take 54 mg by mouth daily.   folic acid 1 MG tablet Commonly known as: FOLVITE Take 1 mg by mouth daily.   gabapentin 100 MG capsule Commonly known as: NEURONTIN Take 300 mg by mouth at bedtime.   hydrOXYzine 25 MG tablet Commonly known as: ATARAX/VISTARIL Take 25 mg by mouth daily.   latanoprost 0.005 %  ophthalmic solution Commonly known as: XALATAN Place 1 drop into both eyes at bedtime.   NON FORMULARY Inject 1 Syringe into the skin 2 (two) times a week. Allergy Shots   ondansetron 4 MG tablet Commonly known as: ZOFRAN Take 1 tablet (4 mg total) by mouth every 6 (six) hours as needed for nausea.   oxyCODONE 5 MG immediate release tablet Commonly known as: Oxy IR/ROXICODONE Take 1 tablet (5 mg total) by mouth every 4 (four) hours as needed for moderate pain (pain score 4-6).   pioglitazone 30 MG tablet Commonly known as: ACTOS Take 30 mg by mouth daily.   senna 8.6 MG Tabs tablet Commonly known as: SENOKOT Take 2 tablets (17.2 mg total) by mouth at bedtime.   SUMAtriptan 100 MG tablet Commonly known as: IMITREX Take 1 tablet (100 mg total) by mouth once as needed. May repeat in 2 hours if headache persists or recurs.   topiramate 50 MG tablet Commonly known as: Topamax Take 1 tablet (50 mg total) by mouth at bedtime.   Vitamin D 50 MCG (2000 UT) tablet Take 2,000 Units by mouth daily.            Durable Medical Equipment  (From admission, onward)         Start     Ordered   07/30/19 1125  For home use only DME Hospital bed  Once       Question Answer Comment  Length of Need Lifetime   Patient has (list medical condition): Failed hip replacement   The above medical condition requires: Patient requires the ability to reposition frequently   Head must be elevated greater than: 30 degrees   Bed type Semi-electric   Trapeze Bar Yes   Support Surface: Gel Overlay      07/30/19 1127   07/30/19 1124  For home use  only DME lightweight manual wheelchair with seat cushion  Once       Comments: Patient suffers from  hip surgery  which impairs their ability to perform daily activities like ADL's in the home.  A  walker will not resolve  issue with performing activities of daily living. A wheelchair will allow patient to safely perform daily activities. Patient is not  able to propel themselves in the home using a standard weight wheelchair due to weakness:[id number]  Patient can self propel in the lightweight wheelchair. Length of need  is lifetime. Accessories: elevating leg rests (ELRs), wheel locks, extensions and anti-tippers.   07/30/19 1127           Discharge Care Instructions  (From admission, onward)         Start     Ordered   08/03/19 0000  Change dressing on IV access line weekly and PRN  (Home infusion instructions - [facility] )        08/03/19 1208          Diagnostic Studies: DG Pelvis Portable  Result Date: 07/28/2019 CLINICAL DATA:  Postop. EXAM: PORTABLE PELVIS 1-2 VIEWS COMPARISON:  Preoperative radiograph 12/17/2018 FINDINGS: Revision right hip arthroplasty with cerclage wire fixation. No periprosthetic lucency or fracture. Recent postsurgical change includes air and edema in the soft tissues. IMPRESSION: Revision right hip arthroplasty without immediate postoperative complication. Electronically Signed   By: Keith Rake M.D.   On: 07/28/2019 19:51   DG C-Arm 1-60 Min  Result Date: 07/28/2019 CLINICAL DATA:  Right hip revision. EXAM: DG C-ARM 1-60 MIN; OPERATIVE RIGHT HIP WITH PELVIS FLUOROSCOPY TIME:  Fluoroscopy Time:  22 seconds Radiation Exposure Index (if provided by the fluoroscopic device): 4.96 mGy Number of Acquired Spot Images: 4 COMPARISON:  None. FINDINGS: Four fluoroscopic spot views in the operating room. Right hip arthroplasty with proximal cerclage wire fixation. Total fluoroscopy time 22 seconds. IMPRESSION: Fluoroscopic spot views during right hip arthroplasty revision. Electronically Signed   By: Keith Rake M.D.   On: 07/28/2019 19:37   DG HIP OPERATIVE UNILAT W OR W/O PELVIS RIGHT  Result Date: 07/28/2019 CLINICAL DATA:  Right hip revision. EXAM: DG C-ARM 1-60 MIN; OPERATIVE RIGHT HIP WITH PELVIS FLUOROSCOPY TIME:  Fluoroscopy Time:  22 seconds Radiation Exposure Index (if provided by the  fluoroscopic device): 4.96 mGy Number of Acquired Spot Images: 4 COMPARISON:  None. FINDINGS: Four fluoroscopic spot views in the operating room. Right hip arthroplasty with proximal cerclage wire fixation. Total fluoroscopy time 22 seconds. IMPRESSION: Fluoroscopic spot views during right hip arthroplasty revision. Electronically Signed   By: Keith Rake M.D.   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 Risk Factors: DVT 03/20/17 prox-dist FV, popliteal vein and PTV DVT. Limitations: Body habitus. Comparison Study: Last venous duplex performed 06/13/17 showed complete  resolution of the right lower extremity DVT with no evidence                   of thrombosis. Bilateral venous reflux study done 08/25/17                   showed no evidence of deep or superficial venous thrombosis. Performing Technologist: Mariane Masters RVT  Examination Guidelines: A complete evaluation includes B-mode imaging, spectral Doppler, color Doppler, and power Doppler as needed of all accessible portions of each vessel. Bilateral testing is considered an integral part of a complete examination. Limited examinations for reoccurring indications may be performed as noted. The reflux portion of the exam is performed with the patient in reverse Trendelenburg.  +---------+---------------+---------+-----------+----------+--------------+ RIGHT    CompressibilityPhasicitySpontaneityPropertiesThrombus Aging +---------+---------------+---------+-----------+----------+--------------+ CFV      Full           Yes      Yes                                  +---------+---------------+---------+-----------+----------+--------------+ SFJ      Full           Yes      Yes                                 +---------+---------------+---------+-----------+----------+--------------+ FV Prox  Full           Yes      Yes                                 +---------+---------------+---------+-----------+----------+--------------+ FV Mid   Full           Yes      Yes                                 +---------+---------------+---------+-----------+----------+--------------+ FV DistalFull           Yes      Yes                                 +---------+---------------+---------+-----------+----------+--------------+ PFV      Full                                                        +---------+---------------+---------+-----------+----------+--------------+ POP      Full           Yes      Yes                                 +---------+---------------+---------+-----------+----------+--------------+ PTV      Full           Yes      Yes                                 +---------+---------------+---------+-----------+----------+--------------+ PERO  Yes                                          +---------+---------------+---------+-----------+----------+--------------+ Gastroc  Full                                                        +---------+---------------+---------+-----------+----------+--------------+ GSV      Full           Yes      Yes                                 +---------+---------------+---------+-----------+----------+--------------+ Limited visualization of the calf veins due to body habitus and edema. Peroneal veins appear patent by color and spectral Doppler.  +----+---------------+---------+-----------+----------+--------------+ LEFTCompressibilityPhasicitySpontaneityPropertiesThrombus Aging +----+---------------+---------+-----------+----------+--------------+ CFV  Full           Yes      Yes                                 +----+---------------+---------+-----------+----------+--------------+   Findings reported to Dr. Addison Lank at 4:30 pm.  Summary: RIGHT: - No evidence of deep vein thrombosis in the lower extremity. No indirect evidence of obstruction proximal to the inguinal ligament. - No cystic structure found in the popliteal fossa.  LEFT: - No evidence of common femoral vein obstruction.  *See table(s) above for measurements and observations. Electronically signed by Ida Rogue MD on 07/15/2019 at 5:21:48 PM.    Final    [place] EKG SITE RITE  Result Date: 07/31/2019 If Site Rite image not attached, placement could not be confirmed due to current cardiac rhythm.   Disposition: Discharge disposition: 01-Home or Self Care       Discharge Instructions    [facility] pharmacist to adjust dose for Vancomycin, Aminoglycosides and other anti-infective therapies as requested by physician.   Complete by: As directed    [facility] to provide Cath Flo 76m   Complete by: As directed    Administer for PICC line occlusion and as ordered by physician for other access device issues.   Anaphylaxis Kit: Provided to treat any anaphylactic reaction to the medication being provided to the patient if First Dose or when requested by physician   Complete by: As directed    Epinephrine 161mml vial / amp: Administer 0.56m72m0.56ml87mubcutaneously once for moderate to severe anaphylaxis, nurse to call physician and pharmacy when reaction occurs and call 911 if needed for immediate care   Diphenhydramine 50mg68mIV vial: Administer 25-50mg 36mM PRN for first dose reaction, rash, itching, mild reaction, nurse to call physician and pharmacy when reaction occurs   Sodium Chloride 0.9% NS 500ml I72mdminister if needed for hypovolemic blood pressure drop or as ordered by physician after call to physician with anaphylactic reaction   Call MD / Call 911    Complete by: As directed    If you experience chest pain or shortness of breath, CALL 911 and be transported to the hospital emergency room.  If you develope a fever above 101 F, pus (white drainage) or increased drainage or redness at the wound, or calf  pain, call your surgeon's office.   Change dressing on IV access line weekly and PRN   Complete by: As directed    Constipation Prevention   Complete by: As directed    Drink plenty of fluids.  Prune juice may be helpful.  You may use a stool softener, such as Colace (over the counter) 100 mg twice a day.  Use MiraLax (over the counter) for constipation as needed.   Diet - low sodium heart healthy   Complete by: As directed    Driving restrictions   Complete by: As directed    No driving for 12 weeks   Flush IV access with Sodium Chloride 0.9% and Heparin 10 units/ml or 100 units/ml   Complete by: As directed    Follow the hip precautions as taught in Physical Therapy   Complete by: As directed    Home infusion instructions - [facility]   Complete by: As directed    Instructions: Flush IV access with Sodium Chloride 0.9% and Heparin 10units/ml or 100units/ml   Change dressing on IV access line: Weekly and PRN   Instructions Cath Flo 95m: Administer for PICC Line occlusion and as ordered by physician for other access device   [facility] pharmacist to adjust dose for: Vancomycin, Aminoglycosides and other anti-infective therapies as requested by physician   Increase activity slowly as tolerated   Complete by: As directed    Lifting restrictions   Complete by: As directed    No lifting for 12 weeks   Method of administration may be changed at the discretion of home infusion pharmacist based upon assessment of the patient and/or caregiver's ability to self-administer the medication ordered   Complete by: As directed    TED hose   Complete by: As directed    Use stockings (TED hose) for 2 weeks on both leg(s).  You  may remove them at night for sleeping.       Contact information for follow-up providers    SRod Can MD. Schedule an appointment as soon as possible for a visit on 08/11/2019.   Specialty: Orthopedic Surgery [facility] Contact information: [address] [place] [phone]       CMichel Bickers MD. Schedule an appointment as soon as possible for a visit in 4 week(s).   Specialty: Infectious Diseases Contact information: [address] [phone]           Contact information for after-discharge care    [facility]   Service: Skilled Nursing Contact information: [phone]                  Signed: BBertram Savin6/30/2021, 10:26 AM

## 2019-08-04 NOTE — Social Work (Signed)
Clinical Social Worker facilitated patient discharge including contacting patient family and facility to confirm patient discharge plans.  Clinical information faxed to facility and family agreeable with plan.  CSW arranged ambulance transport via [facility] to [facility] RN to call [phone]  with report prior to discharge.  Clinical Social Worker will sign off for now as social work intervention is no longer needed. Please consult [place] again if new need arises.  Octavio Graves, MSW, LCSW Clinical Social Worker

## 2019-08-05 ENCOUNTER — Telehealth: Payer: Self-pay

## 2019-08-05 NOTE — Telephone Encounter (Signed)
Received call from [facility] nursing stating patient has history of penicillin allergy. LPN reviewed pharmacy hospital notes and made SNF aware that allergy assessment was completed during hospitalization and allergy was removed from patient record. Allergy assessment faxed to SNF. Valarie Cones

## 2019-08-07 LAB — AEROBIC/ANAEROBIC CULTURE W GRAM STAIN (SURGICAL/DEEP WOUND)

## 2019-08-07 LAB — SUSCEPTIBILITY RESULT

## 2019-08-07 LAB — SUSCEPTIBILITY, AER + ANAEROB

## 2019-08-24 DIAGNOSIS — M069 Rheumatoid arthritis, unspecified: Secondary | ICD-10-CM | POA: Insufficient documentation

## 2019-08-24 DIAGNOSIS — T8451XA Infection and inflammatory reaction due to internal right hip prosthesis, initial encounter: Secondary | ICD-10-CM | POA: Insufficient documentation

## 2019-08-24 DIAGNOSIS — A491 Streptococcal infection, unspecified site: Secondary | ICD-10-CM | POA: Insufficient documentation

## 2019-08-25 ENCOUNTER — Other Ambulatory Visit: Payer: Self-pay

## 2019-08-25 ENCOUNTER — Encounter: Payer: Self-pay | Admitting: Internal Medicine

## 2019-08-25 ENCOUNTER — Ambulatory Visit: Payer: Medicare PPO | Admitting: Internal Medicine

## 2019-08-25 ENCOUNTER — Telehealth: Payer: Self-pay

## 2019-08-25 DIAGNOSIS — A491 Streptococcal infection, unspecified site: Secondary | ICD-10-CM | POA: Diagnosis not present

## 2019-08-25 DIAGNOSIS — T8451XD Infection and inflammatory reaction due to internal right hip prosthesis, subsequent encounter: Secondary | ICD-10-CM

## 2019-08-25 DIAGNOSIS — M069 Rheumatoid arthritis, unspecified: Secondary | ICD-10-CM

## 2019-08-25 MED ORDER — FLUCONAZOLE 100 MG PO TABS: 100.0000 mg | ORAL_TABLET | Freq: Every day | ORAL | 5 refills | Status: DC

## 2019-08-25 MED ORDER — AMOXICILLIN 500 MG PO CAPS: 500.0000 mg | ORAL_CAPSULE | Freq: Two times a day (BID) | ORAL | 11 refills | Status: DC

## 2019-08-25 MED ORDER — AMPICILLIN IV (FOR PTA / DISCHARGE USE ONLY): 12.0000 g | INTRAVENOUS | Status: DC

## 2019-08-25 NOTE — Progress Notes (Signed)
[facility]  Patient Active Problem List   Diagnosis Date Noted  . 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 Enterococcal infection 08/24/2019    Priority: High  . Rheumatoid arthritis ([place]) 08/24/2019  . 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 Acute deep vein thrombosis (DVT) of right lower extremity ([place]) 04/01/2017  . Epistaxis 03/14/2017  . 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 Avascular necrosis of bone of hip, right ([place]) 12/18/2016  . Trochanteric bursitis, right hip 11/04/2016  . Pain of right hip joint 11/04/2016  . S/P lumbar spinal fusion 07/21/2015    Patient's Medications  New Prescriptions   AMOXICILLIN (AMOXIL) 500 MG CAPSULE    Take 1 capsule (500 mg total) by mouth 2 (two) times daily.   FLUCONAZOLE (DIFLUCAN) 100 MG TABLET    Take 1 tablet (100 mg total) by mouth daily.  Previous Medications   ACETAMINOPHEN (TYLENOL) 325 MG TABLET    Take 2 tablets (650 mg total) by mouth every 6 (six) hours as needed for mild pain (pain score 1-3 or temp > 100.5).   APIXABAN (ELIQUIS) 2.5 MG TABS TABLET    Take 1 tablet (2.5 mg total) by mouth every 12 (twelve) hours.   CETIRIZINE (ZYRTEC) 10 MG TABLET    Take 10 mg by mouth daily.    CHOLECALCIFEROL (VITAMIN D) 50 MCG (2000 UT) TABLET    Take 2,000 Units by mouth daily.   DOCUSATE SODIUM (COLACE) 100 MG CAPSULE    Take 1 capsule (100 mg total) by mouth 2 (two) times daily.   DULOXETINE (CYMBALTA) 30 MG CAPSULE    Take 60 mg by mouth daily.    EPIPEN 2-PAK 0.3 MG/0.3ML SOAJ INJECTION    Inject 0.3 mg into the muscle daily as needed. For anaphylactic allergic reaction   EZETIMIBE (ZETIA) 10 MG TABLET    Take 10 mg by mouth daily.   FENOFIBRATE 54 MG TABLET    Take 54 mg by mouth daily.   FOLIC ACID (FOLVITE) 1 MG TABLET    Take 1 mg by mouth daily.   GABAPENTIN  (NEURONTIN) 100 MG CAPSULE    Take 300 mg by mouth at bedtime.    HYDROXYZINE (ATARAX/VISTARIL) 25 MG TABLET    Take 25 mg by mouth daily.   LATANOPROST (XALATAN) 0.005 % OPHTHALMIC SOLUTION    Place 1 drop into both eyes at bedtime.    NON FORMULARY    Inject 1 Syringe into the skin 2 (two) times a week. Allergy Shots   ONDANSETRON (ZOFRAN) 4 MG TABLET    Take 1 tablet (4 mg total) by mouth every 6 (six) hours as needed for nausea.   OXYCODONE (OXY IR/ROXICODONE) 5 MG IMMEDIATE RELEASE TABLET    Take 1 tablet (5 mg total) by mouth every 4 (four) hours as needed for moderate pain (pain score 4-6).   PIOGLITAZONE (ACTOS) 30 MG TABLET    Take 30 mg by mouth daily.   SENNA (SENOKOT) 8.6 MG TABS TABLET    Take 2 tablets (17.2 mg total) by mouth at bedtime.   SUMATRIPTAN (IMITREX) 100 MG TABLET    Take 1 tablet (100 mg total) by mouth once as needed. May repeat in 2 hours if headache persists or recurs.   TOPIRAMATE (TOPAMAX) 50 MG TABLET    Take  1 tablet (50 mg total) by mouth at bedtime.  Modified Medications   Modified Medication Previous Medication   AMPICILLIN IVPB ampicillin IVPB      Inject 12 g into the vein daily for 14 days. As a continuous infusion. Indication:  Enterococcal prosthetic hip infection First Dose: Yes Last Day of Therapy:  09/08/19 Labs - Once weekly:  CBC/D and BMP, Labs - Every other week:  ESR and CRP Method of administration: Ambulatory Pump (Continuous Infusion) Method of administration may be changed at the discretion of home infusion pharmacist based upon assessment of the patient and/or caregiver's ability to self-administer the medication ordered.    Inject 12 g into the vein daily. As a continuous infusion. Indication:  Enterococcal prosthetic hip infection First Dose: Yes Last Day of Therapy:  09/08/19 Labs - Once weekly:  CBC/D and BMP, Labs - Every other week:  ESR and CRP Method of administration: Ambulatory Pump (Continuous Infusion) Method of administration  may be changed at the discretion of home infusion pharmacist based upon assessment of the patient and/or caregiver's ability to self-administer the medication ordered.  Discontinued Medications   No medications on file    Subjective: [name] is in for her hospital follow-up visit.  She is accompanied by one of her daughters.  She has rheumatoid arthritis and underwent right total hip arthroplasty in December 2018.  She has had gradually worsening right hip pain over the last several years.  Her bone scan last December showed signs of loosening of the femoral component.  She was admitted on 07/28/2019 and underwent revision of the femoral component.  There was no obvious purulence noted at the time of surgery but operative cultures grew Enterococcus.  A PICC was placed and she was discharged on a continuous infusion of ampicillin.  She has now completed 4 weeks of therapy.  She has not had any problems tolerating her PICC or antibiotics.  She still only on partial weightbearing but is feeling much better.  She says that she is not having any right hip pain.  Her staples are out and her wound has healed.  She is not having any wound drainage.  She has been off of her methotrexate and biologic infusion.  Fortunately she has not had any flare of her RA symptoms.  She also stopped her allergy shots.  Review of Systems: Review of Systems  Constitutional: Negative for chills, diaphoresis and fever.  Gastrointestinal: Negative for abdominal pain, diarrhea, nausea and vomiting.  Musculoskeletal: Negative for joint pain.    Past Medical History:  Diagnosis Date  . Chronic fatigue   . Depression   . Diabetes mellitus without complication ([place])   . Early cataracts, bilateral   . Fibromyalgia   . Glaucoma   . Headache    Vestibular migraines  . History of bronchitis   . History of pneumonia   . Hypercholesterolemia   . RA (rheumatoid arthritis) ([place])   . RA (rheumatoid arthritis) ([place])   . TIA  (transient ischemic attack)   . Vertigo   . Wears glasses     Social History   Tobacco Use  . Smoking status: Never Smoker  . Smokeless tobacco: Never Used  Vaping Use  . Vaping Use: Never used  Substance Use Topics  . Alcohol use: No  . Drug use: No    Family History  Problem Relation Age of Onset  . Diabetes Mother   . Stroke Mother   . Heart disease Mother   .  High blood pressure Father   . Stroke Father   . Diabetes Father   . Heart disease Father   . Heart disease Sister     Allergies  Allergen Reactions  . Sulfa Antibiotics Other (See Comments)    Don't remember     Objective: Vitals:   08/25/19 1124  BP: 135/77  Pulse: 83  Temp: 98.4 F (36.9 C)  SpO2: 93%   There is no height or weight on file to calculate BMI.  Physical Exam Constitutional:      Comments: He is very pleasant and in no distress.  She is seated in her wheelchair.  Musculoskeletal:     Comments: Her incision is healing nicely.  Skin:    Findings: No rash.     Comments: Her left arm PICC site looks good.  Psychiatric:        Mood and Affect: Mood normal.       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  She will complete 6 weeks of therapy on 09/08/2019 then convert to oral amoxicillin.  I am comfortable with her restarting her RA meds when she completes IV therapy in 2 weeks.  Also see no reason why she could not restart her allergy shots.  She tells me that she is prone to vaginal yeast infections when she is on antibiotics.  I have given her a prescription for fluconazole to take as needed.  She will follow-up with me at the end of August.      Relevant Medications   amoxicillin (AMOXIL) 500 MG capsule (Start on 09/09/2019)   fluconazole (DIFLUCAN) 100 MG tablet   Enterococcal infection   Relevant Medications    fluconazole (DIFLUCAN) 100 MG tablet       Amanda Bickers, MD [facility] [phone] pager   [phone] cell 08/25/2019, 12:43 PM

## 2019-08-25 NOTE — Assessment & Plan Note (Signed)
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  She will complete 6 weeks of therapy on 09/08/2019 then convert to oral amoxicillin.  I am comfortable with her restarting her RA meds when she completes IV therapy in 2 weeks.  Also see no reason why she could not restart her allergy shots.  She tells me that she is prone to vaginal yeast infections when she is on antibiotics.  I have given her a prescription for fluconazole to take as needed.  She will follow-up with me at the end of August.

## 2019-08-25 NOTE — Telephone Encounter (Signed)
Called [facility] per Dr. Blair Dolphin order to stop IV medication and pull PICC on 09/08/2019. This was confirmed with [facility] and was successfully communicated with them.

## 2019-08-30 ENCOUNTER — Telehealth: Payer: Self-pay

## 2019-08-30 ENCOUNTER — Other Ambulatory Visit: Payer: Self-pay | Admitting: Internal Medicine

## 2019-08-30 DIAGNOSIS — A491 Streptococcal infection, unspecified site: Secondary | ICD-10-CM

## 2019-08-30 MED ORDER — VANCOMYCIN HCL IN DEXTROSE 1-5 GM/200ML-% IV SOLN: 1000.0000 mg | INTRAVENOUS | Status: DC

## 2019-08-30 NOTE — Telephone Encounter (Signed)
She is probably having an allergic reaction to ampicillin.  Please give orders to discontinue ampicillin and start vancomycin.

## 2019-08-30 NOTE — Telephone Encounter (Signed)
Larita Fife, Pharmacist called office with concerns from patient. States home health called to update pharmacy that patient has rash all over her body. States this started Friday. Is currently taking Ampicillin since 7/9. Is due to complete antibiotics on 8/4. Would like to update MD.  Lorenso Courier, CMA

## 2019-08-30 NOTE — Telephone Encounter (Signed)
No change in the 09/08/2019 stop date.

## 2019-08-30 NOTE — Telephone Encounter (Signed)
Home health would like to confirm antibiotic end date. Updated with antibiotic orders. Updated patient on antibiotic change. Will call office if rash gets worse.  Lorenso Courier, [place]

## 2019-09-03 ENCOUNTER — Encounter: Payer: Self-pay | Admitting: Internal Medicine

## 2019-09-06 ENCOUNTER — Encounter: Payer: Self-pay | Admitting: Internal Medicine

## 2019-09-08 ENCOUNTER — Encounter: Payer: Self-pay | Admitting: Internal Medicine

## 2019-09-08 ENCOUNTER — Other Ambulatory Visit: Payer: Self-pay

## 2019-09-08 ENCOUNTER — Ambulatory Visit: Payer: Medicare PPO | Admitting: Internal Medicine

## 2019-09-08 ENCOUNTER — Telehealth: Payer: Self-pay

## 2019-09-08 DIAGNOSIS — B952 Enterococcus as the cause of diseases classified elsewhere: Secondary | ICD-10-CM | POA: Diagnosis not present

## 2019-09-08 DIAGNOSIS — T8451XD Infection and inflammatory reaction due to internal right hip prosthesis, subsequent encounter: Secondary | ICD-10-CM | POA: Diagnosis not present

## 2019-09-08 DIAGNOSIS — M069 Rheumatoid arthritis, unspecified: Secondary | ICD-10-CM

## 2019-09-08 MED ORDER — VANCOMYCIN HCL IN DEXTROSE 1-5 GM/200ML-% IV SOLN: 1000.0000 mg | INTRAVENOUS | Status: DC

## 2019-09-08 NOTE — Progress Notes (Addendum)
[facility]  Patient Active Problem List   Diagnosis Date Noted  . 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 Enterococcal infection 08/24/2019    Priority: High  . Rheumatoid arthritis (HCC) 08/24/2019  . 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 Acute deep vein thrombosis (DVT) of right lower extremity (HCC) 04/01/2017  . Epistaxis 03/14/2017  . 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 Avascular necrosis of bone of hip, right (HCC) 12/18/2016  . Trochanteric bursitis, right hip 11/04/2016  . Pain of right hip joint 11/04/2016  . S/P lumbar spinal fusion 07/21/2015    Patient's Medications  New Prescriptions   VANCOMYCIN (VANCOCIN) 1-5 GM/200ML-% SOLN    Inject 200 mLs (1,000 mg total) into the vein daily.  Previous Medications   ACETAMINOPHEN (TYLENOL) 325 MG TABLET    Take 2 tablets (650 mg total) by mouth every 6 (six) hours as needed for mild pain (pain score 1-3 or temp > 100.5).   APIXABAN (ELIQUIS) 2.5 MG TABS TABLET    Take 1 tablet (2.5 mg total) by mouth every 12 (twelve) hours.   CETIRIZINE (ZYRTEC) 10 MG TABLET    Take 10 mg by mouth daily.    CHOLECALCIFEROL (VITAMIN D) 50 MCG (2000 UT) TABLET    Take 2,000 Units by mouth daily.    DOCUSATE SODIUM (COLACE) 100 MG CAPSULE    Take 1 capsule (100 mg total) by mouth 2 (two) times daily.   DULOXETINE (CYMBALTA) 30 MG CAPSULE    Take 60 mg by mouth daily.    EPIPEN 2-PAK 0.3 MG/0.3ML SOAJ INJECTION    Inject 0.3 mg into the muscle daily as needed. For anaphylactic allergic reaction   EZETIMIBE (ZETIA) 10 MG TABLET    Take 10 mg by mouth daily.   FENOFIBRATE 54 MG TABLET    Take 54 mg by mouth daily.   FLUCONAZOLE (DIFLUCAN) 100 MG TABLET    Take 1 tablet (100 mg total) by mouth daily.   FOLIC ACID (FOLVITE) 1 MG TABLET    Take 1 mg by mouth daily.   GABAPENTIN  (NEURONTIN) 100 MG CAPSULE    Take 300 mg by mouth at bedtime.    HYDROXYZINE (ATARAX/VISTARIL) 25 MG TABLET    Take 25 mg by mouth daily.   LATANOPROST (XALATAN) 0.005 % OPHTHALMIC SOLUTION    Place 1 drop into both eyes at bedtime.    NON FORMULARY    Inject 1 Syringe into the skin 2 (two) times a week. Allergy Shots   ONDANSETRON (ZOFRAN) 4 MG TABLET    Take 1 tablet (4 mg total) by mouth every 6 (six) hours as needed for nausea.   OXYCODONE (OXY IR/ROXICODONE) 5 MG IMMEDIATE RELEASE TABLET    Take 1 tablet (5 mg total) by mouth every 4 (four) hours as needed for moderate pain (pain score 4-6).   PIOGLITAZONE (ACTOS) 30 MG TABLET    Take 30 mg by mouth daily.   SENNA (SENOKOT) 8.6 MG TABS TABLET    Take 2 tablets (17.2 mg total) by mouth at bedtime.   SUMATRIPTAN (IMITREX) 100 MG TABLET    Take 1 tablet (100 mg total) by mouth once as needed. May repeat in 2 hours if headache persists or recurs.   TOPIRAMATE (TOPAMAX) 50 MG TABLET    Take 1  tablet (50 mg total) by mouth at bedtime.  Modified Medications   No medications on file  Discontinued Medications   VANCOMYCIN (VANCOCIN) 1-5 GM/200ML-% SOLN    Inject 200 mLs (1,000 mg total) into the vein daily.    Subjective: Ms. Aprahamian is in for her routine follow-up visit.  She has rheumatoid arthritis and underwent right total hip arthroplasty in December 2018.  She has had gradually worsening right hip pain over the last several years.  Her bone scan last December showed signs of loosening of the femoral component.  She was admitted on 07/28/2019 and underwent revision of the femoral component. There was no obvious purulence noted at the time of surgery but operative cultures grew Enterococcus.  A PICC was placed and she was discharged on a continuous infusion of ampicillin (she had a remote history of allergic reaction to amoxicillin but tolerated an amoxicillin challenge in the hospital).  She was doing well when I saw her on 08/25/2019.  3 days  later she developed a red rash on her chest.  Over the next 3 days it slowly spread to her forearms.  It did not itch. I changed her ampicillin to IV vancomycin.  The rash resolved promptly.  She has now completed 6 weeks of total IV antibiotic therapy.  Her incisions have healed.  She has not had any problems tolerating her PICC line or vancomycin.  Her right hip pain is much improved.  She has not restarted her RA meds.  She feels that her RA may be only slightly worse since stopping those medications.  Review of Systems: Review of Systems  Constitutional: Negative for chills, diaphoresis and fever.  Gastrointestinal: Positive for nausea. Negative for abdominal pain, diarrhea and vomiting.  Musculoskeletal: Positive for joint pain.  Skin: Negative for itching and rash.    Past Medical History:  Diagnosis Date  . Chronic fatigue   . Depression   . Diabetes mellitus without complication (HCC)   . Early cataracts, bilateral   . Fibromyalgia   . Glaucoma   . Headache    Vestibular migraines  . History of bronchitis   . History of pneumonia   . Hypercholesterolemia   . RA (rheumatoid arthritis) (HCC)   . RA (rheumatoid arthritis) (HCC)   . TIA (transient ischemic attack)   . Vertigo   . Wears glasses     Social History   Tobacco Use  . Smoking status: Never Smoker  . Smokeless tobacco: Never Used  Vaping Use  . Vaping Use: Never used  Substance Use Topics  . Alcohol use: No  . Drug use: No    Family History  Problem Relation Age of Onset  . Diabetes Mother   . Stroke Mother   . Heart disease Mother   . High blood pressure Father   . Stroke Father   . Diabetes Father   . Heart disease Father   . Heart disease Sister     Allergies  Allergen Reactions  . Sulfa Antibiotics Other (See Comments)    Don't remember     Objective: Vitals:   09/08/19 1139  BP: (!) 149/69  Pulse: 71  Temp: 98.3 F (36.8 C)  TempSrc: Oral   There is no height or weight on file to  calculate BMI.  Physical Exam Constitutional:      Comments: She is pleasant and in no distress.  She is accompanied by her daughter.  Cardiovascular:     Rate and Rhythm: Normal rate.  Heart sounds: No murmur heard.   Pulmonary:     Effort: Pulmonary effort is normal.     Breath sounds: Normal breath sounds.  Abdominal:     General: There is no distension.     Palpations: Abdomen is soft.  Musculoskeletal:     Comments: Right hip incisions are healed.  Skin:    Findings: No rash.     Comments: PICC site looks good.  Psychiatric:        Mood and Affect: Mood normal.       Problem List Items Addressed This Visit      High   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)    She most likely has chronic smoldering right prosthetic hip infection due to Enterococcus given her chronic worsening pain and evidence of prosthesis loosening on her bone scan last December.  She is doing better following her recent surgery and 6 weeks of IV antibiotic therapy.  This would normally be the time when she was transitioned to chronic suppressive oral therapy.    However her recent rash on ampicillin greatly complicates the issue.  If that was an allergic reaction to ampicillin then oral amoxicillin would not be a safe option.  I considered using oral linezolid short-term (there is a fairly high rate of serious adverse reactions with use greater than 30 days).  However she takes duloxetine and sumatriptan daily and there are significant adverse drug drug interactions with linezolid. Her daughter does not feel that she could safely come off of duloxetine or sumatriptan now.    We have decided to continue IV vancomycin and ask her allergist, Dr. Morada Callas, to help evaluate if she is truly allergic to amoxicillin.  If she is and she cannot be desensitized, then there is no good long-term suppressive oral option meaning that her infection is almost certainly going to relapse and cause a need for further  surgery.      Relevant Medications   vancomycin (VANCOCIN) 1-5 GM/200ML-% SOLN   Other Relevant Orders   CBC   Basic metabolic panel   C-reactive protein   Sedimentation rate   Vancomycin, trough     Unprioritized   Rheumatoid arthritis (HCC)    If her rheumatoid arthritis starts to flare I think it would be safe to restart RA meds at any time now that she has had surgery and 6 weeks antibiotic therapy.          Cliffton Asters, MD [facility] [phone] pager   [phone] cell 09/08/2019, 12:55 PM

## 2019-09-08 NOTE — Telephone Encounter (Signed)
Verbal orders given to pharmacist [facility] to continue the patient's vancomycin until 10/06/19 per Dr. Orvan Falconer. Mary verbalized understanding. [name] T Pricilla Loveless

## 2019-09-08 NOTE — Assessment & Plan Note (Signed)
If her rheumatoid arthritis starts to flare I think it would be safe to restart RA meds at any time now that she has had surgery and 6 weeks antibiotic therapy.

## 2019-09-08 NOTE — Assessment & Plan Note (Signed)
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  She is doing better following her recent surgery and 6 weeks of IV antibiotic therapy.  This would normally be the time when she was transitioned to chronic suppressive oral therapy.    However her recent rash on ampicillin greatly complicates the issue.  If that was an allergic reaction to ampicillin then oral amoxicillin would not be a safe option.  I considered using oral linezolid short-term (there is a fairly high rate of serious adverse reactions with use greater than 30 days).  However she takes duloxetine and sumatriptan daily and there are significant adverse drug drug interactions with linezolid. Her daughter does not feel that she could safely come off of duloxetine or sumatriptan now.    We have decided to continue IV vancomycin and ask her allergist, Dr. [place] Callas, to help evaluate if she is truly allergic to amoxicillin.  If she is and she cannot be desensitized, then there is no good long-term suppressive oral option meaning that her infection is almost certainly going to relapse and cause a need for further surgery.

## 2019-09-08 NOTE — Telephone Encounter (Signed)
Patient's home health nurse called office stating she was unable to get labs done today. Last labs done were on 09/06/19. Will forward message to MD Lorenso Courier, CMA

## 2019-09-09 ENCOUNTER — Telehealth: Payer: Self-pay

## 2019-09-09 LAB — CBC
HCT: 32.7 % — ABNORMAL LOW (ref 35.0–45.0)
Hemoglobin: 10.3 g/dL — ABNORMAL LOW (ref 11.7–15.5)
MCH: 29.8 pg (ref 27.0–33.0)
MCHC: 31.5 g/dL — ABNORMAL LOW (ref 32.0–36.0)
MCV: 94.5 fL (ref 80.0–100.0)
MPV: 10.1 fL (ref 7.5–12.5)
Platelets: 385 10*3/uL (ref 140–400)
RBC: 3.46 10*6/uL — ABNORMAL LOW (ref 3.80–5.10)
RDW: 13.4 % (ref 11.0–15.0)
WBC: 6.7 10*3/uL (ref 3.8–10.8)

## 2019-09-09 LAB — BASIC METABOLIC PANEL
BUN: 8 mg/dL (ref 7–25)
CO2: 25 mmol/L (ref 20–32)
Calcium: 10.8 mg/dL — ABNORMAL HIGH (ref 8.6–10.4)
Chloride: 104 mmol/L (ref 98–110)
Creat: 0.84 mg/dL (ref 0.60–0.93)
Glucose, Bld: 130 mg/dL — ABNORMAL HIGH (ref 65–99)
Potassium: 4.2 mmol/L (ref 3.5–5.3)
Sodium: 138 mmol/L (ref 135–146)

## 2019-09-09 LAB — C-REACTIVE PROTEIN: CRP: 2.8 mg/L (ref <8.0)

## 2019-09-09 LAB — SEDIMENTATION RATE: Sed Rate: 29 mm/h (ref 0–30)

## 2019-09-09 NOTE — Telephone Encounter (Signed)
Per Dr. Lyla Son nurse at [facility] Penicillin desensitization and treatmentwill need to be done at a teaching hospital. Routing to Dr. Orvan Falconer to make aware. Valarie Cones

## 2019-09-10 ENCOUNTER — Telehealth: Payer: Self-pay

## 2019-09-10 NOTE — Telephone Encounter (Signed)
Patient called office today with some concerns. States that both legs and left arm have been swollen for one day. Left arm is not as bad as feet. Legs are swollen down to feet.  Patient has RA and called provider with concerns, but was told to follow up with [place] regarding swelling. Would like to know what she should do. Will forward message to Md. Lorenso Courier, [place]

## 2019-09-10 NOTE — Telephone Encounter (Signed)
[name] may have had a delayed hypersensitivity reaction while on IV ampicillin.  She developed a red splotchy, macular rash on her chest and forearms 4-1/2 weeks into therapy.  The rash resolved when ampicillin was stopped.  I will arrange a time next week for her to come into clinic to take a directly observed dose of amoxicillin. Assuming there is no more immediate reaction I will have her continue low-dose amoxicillin and see if she develops a recurrence of the rash.

## 2019-09-10 NOTE — Telephone Encounter (Signed)
I called and spoke with [name].  She says that she has had more swelling of her legs and left arm recently.  She weighed herself last night and says she has gained 3 pounds recently.  She says that her home nurse tells her that she must drink a large volume of water each day which she struggles to do and her 2 daughters tell her that she needs to eat more than she can tolerate.  She called her PCP office but Dr. Zachery Dauer was not in the office today.  The provider covering for Dr. Zachery Dauer was not comfortable prescribing diuretics.  [name] swelling has decreased today on its own.  I recommended that she stop drinking excessive water and let the swelling reverse spontaneously.  She agrees with that plan.

## 2019-09-13 ENCOUNTER — Telehealth: Payer: Self-pay

## 2019-09-13 ENCOUNTER — Telehealth: Payer: Self-pay | Admitting: *Deleted

## 2019-09-13 NOTE — Telephone Encounter (Signed)
I would like her to schedule a follow-up visit with her PCP.

## 2019-09-13 NOTE — Telephone Encounter (Signed)
That is okay with me 

## 2019-09-13 NOTE — Telephone Encounter (Signed)
Noted. Let me know if you need assistance for patient to come in next week.

## 2019-09-13 NOTE — Telephone Encounter (Signed)
Thank you. Patient and RA office made aware. [name]

## 2019-09-13 NOTE — Telephone Encounter (Signed)
Relayed to patient.  She is on her way from rheumatology where she had a steroid injection (did not see Dr Nickola Major).  She will call Dr Zachery Dauer to schedule an appointment.  She has questions about coming into the office for a trial of amoxicillin under observation?

## 2019-09-13 NOTE — Telephone Encounter (Signed)
Per Dr. Lendon Colonel nurse, Arline Asp, MD would like to give patient One time injection for flare of RA  medication Depo medrol. Routing to Dr. Orvan Falconer for advise.  Valarie Cones

## 2019-09-13 NOTE — Telephone Encounter (Signed)
Patient states she called Dr. Lendon Colonel (rheumatioid MD) due to having a "flare up" Wants to start her on steroids but unsure of what he wants to start her on. Patient will have Dr. Lendon Colonel nurse call [facility] to make Dr. Orvan Falconer aware of which medication (steriods) they want to start her on to make sure it is ok with Dr. Orvan Falconer as well. [name]

## 2019-09-13 NOTE — Telephone Encounter (Signed)
Patient's home health nurse called with update. Patient has gained 6 pounds - was 183 on 8/4, is now 189 8/9.  She has 2+ pitting edema on both legs and her left arm.  She has facial edema as well, but denies any difficulty breathing or swallowing.  Her pain is improved, she is on vancomycin 1750 mg q 24 hours.  Weekly labs were drawn this morning, including vancomycin trough. Home health nurse will also notify [facility] and patient's primary care doctor.   Please advise if patient needs additional work up or follow up. Andree Coss, RN

## 2019-09-14 ENCOUNTER — Emergency Department (HOSPITAL_COMMUNITY)
Admission: EM | Admit: 2019-09-14 | Discharge: 2019-09-15 | Disposition: A | Discharge status: Home / Self Care | Payer: Medicare PPO | Attending: Emergency Medicine | Admitting: Emergency Medicine

## 2019-09-14 ENCOUNTER — Other Ambulatory Visit: Payer: Self-pay

## 2019-09-14 ENCOUNTER — Encounter (HOSPITAL_COMMUNITY): Payer: Self-pay | Admitting: Emergency Medicine

## 2019-09-14 DIAGNOSIS — R110 Nausea: Secondary | ICD-10-CM | POA: Diagnosis not present

## 2019-09-14 DIAGNOSIS — R791 Abnormal coagulation profile: Secondary | ICD-10-CM | POA: Diagnosis present

## 2019-09-14 DIAGNOSIS — Z5321 Procedure and treatment not carried out due to patient leaving prior to being seen by health care provider: Secondary | ICD-10-CM | POA: Insufficient documentation

## 2019-09-14 LAB — CBC WITH DIFFERENTIAL/PLATELET
Abs Immature Granulocytes: 0.03 10*3/uL (ref 0.00–0.07)
Basophils Absolute: 0.1 10*3/uL (ref 0.0–0.1)
Basophils Relative: 1 %
Eosinophils Absolute: 0.1 10*3/uL (ref 0.0–0.5)
Eosinophils Relative: 1 %
HCT: 35.6 % — ABNORMAL LOW (ref 36.0–46.0)
Hemoglobin: 10.8 g/dL — ABNORMAL LOW (ref 12.0–15.0)
Immature Granulocytes: 0 %
Lymphocytes Relative: 32 %
Lymphs Abs: 2.4 10*3/uL (ref 0.7–4.0)
MCH: 29.3 pg (ref 26.0–34.0)
MCHC: 30.3 g/dL (ref 30.0–36.0)
MCV: 96.5 fL (ref 80.0–100.0)
Monocytes Absolute: 0.8 10*3/uL (ref 0.1–1.0)
Monocytes Relative: 10 %
Neutro Abs: 4 10*3/uL (ref 1.7–7.7)
Neutrophils Relative %: 56 %
Platelets: 374 10*3/uL (ref 150–400)
RBC: 3.69 MIL/uL — ABNORMAL LOW (ref 3.87–5.11)
RDW: 15.6 % — ABNORMAL HIGH (ref 11.5–15.5)
WBC: 7.4 10*3/uL (ref 4.0–10.5)
nRBC: 0 % (ref 0.0–0.2)

## 2019-09-14 LAB — COMPREHENSIVE METABOLIC PANEL
ALT: 11 U/L (ref 0–44)
AST: 23 U/L (ref 15–41)
Albumin: 3.6 g/dL (ref 3.5–5.0)
Alkaline Phosphatase: 84 U/L (ref 38–126)
Anion gap: 11 (ref 5–15)
BUN: 10 mg/dL (ref 8–23)
CO2: 24 mmol/L (ref 22–32)
Calcium: 11 mg/dL — ABNORMAL HIGH (ref 8.9–10.3)
Chloride: 104 mmol/L (ref 98–111)
Creatinine, Ser: 0.92 mg/dL (ref 0.44–1.00)
GFR calc Af Amer: >60 mL/min (ref >60)
GFR calc non Af Amer: >60 mL/min (ref >60)
Glucose, Bld: 165 mg/dL — ABNORMAL HIGH (ref 70–99)
Potassium: 3.8 mmol/L (ref 3.5–5.1)
Sodium: 139 mmol/L (ref 135–145)
Total Bilirubin: 0.3 mg/dL (ref 0.3–1.2)
Total Protein: 7.8 g/dL (ref 6.5–8.1)

## 2019-09-14 LAB — TROPONIN I (HIGH SENSITIVITY)
Troponin I (High Sensitivity): 11 ng/L (ref <18)
Troponin I (High Sensitivity): 12 ng/L (ref <18)

## 2019-09-14 LAB — D-DIMER, QUANTITATIVE: D-Dimer, Quant: 2.87 ug/mL-FEU — ABNORMAL HIGH (ref 0.00–0.50)

## 2019-09-14 NOTE — ED Triage Notes (Signed)
Pt st's she was seen by her MD this am and was called and told to come to the ED ref. Trop 21 and D Dimer 3.22  Pt st's she had slight chest pain but none today.  Pt st's she has had nausea yesterday and today without vomiting

## 2019-09-14 NOTE — ED Notes (Signed)
Pt is leaving AMA. Pt was advise that she should stay and be seen. She stated that she had hip surgery not too long ago and she wasn't going to be able to stay and wait to be seen.

## 2019-09-14 NOTE — Telephone Encounter (Signed)
Please add her onto my schedule tomorrow at 11:15 AM.  Ask her to bring her oral amoxicillin with her.  Thanks.

## 2019-09-14 NOTE — Telephone Encounter (Signed)
Noted. Patient made aware of new appointment and to bring amoxicillin via voicemail.

## 2019-09-15 ENCOUNTER — Ambulatory Visit: Payer: Medicare PPO | Admitting: Internal Medicine

## 2019-09-15 ENCOUNTER — Telehealth: Payer: Self-pay | Admitting: General Practice

## 2019-09-15 ENCOUNTER — Telehealth: Payer: Self-pay

## 2019-09-15 NOTE — Telephone Encounter (Signed)
Office notes and EKG report requested from Dr. Zachery Dauer office.

## 2019-09-15 NOTE — Telephone Encounter (Signed)
    Pt is calling, she asked how she can be a patient if Dr. Katrinka Blazing or DR. Nahser. Advised we need referral from her doctor since she is a new patient

## 2019-09-15 NOTE — Telephone Encounter (Signed)
Notes placed in your box.

## 2019-09-15 NOTE — Telephone Encounter (Signed)
Patient left voicemail in triage states she was told to go to the ER last night by PCP due elevated enzymes. States she left the hospital after waiting for 4+ hours. Patient states she attempted to contact PCP for cardiology referral, but MD out of the office today. Requesting a call from Dr. Orvan Falconer. Valarie Cones

## 2019-09-17 NOTE — Telephone Encounter (Signed)
Please add her onto my schedule next Thursday at 9:45 AM.  Ask her to bring her oral amoxicillin.

## 2019-09-20 NOTE — Telephone Encounter (Signed)
Patient notified and scheduled.  [name]

## 2019-09-22 ENCOUNTER — Telehealth: Payer: Self-pay

## 2019-09-22 NOTE — Telephone Encounter (Signed)
Received call today from home health RN stating vanc trough was not able get labs drawn. Was advised by [facility] to hold off on vancomycin for today. Is scheduled to see MD tomorrow.  Patient's last dose was scheduled for today. Will need new orders for home health to continue care if needed. Lorenso Courier, [place]

## 2019-09-22 NOTE — Telephone Encounter (Signed)
Agree with holding vancomycin.

## 2019-09-22 NOTE — Telephone Encounter (Signed)
COVID-19 Pre-Screening Questions:09/22/19 ° °Do you currently have a fever (>100 °F), chills or unexplained body aches? NO  ° °Are you currently experiencing new cough, shortness of breath, sore throat, runny nose? NO  °•  °Have you been in contact with someone that is currently pending confirmation of Covid19 testing or has been confirmed to have the Covid19 virus? NO ° °**If the patient answers NO to ALL questions -  advise the patient to please call the clinic before coming to the office should any symptoms develop.  ° ° ° °

## 2019-09-23 ENCOUNTER — Ambulatory Visit (INDEPENDENT_AMBULATORY_CARE_PROVIDER_SITE_OTHER): Payer: Medicare PPO | Admitting: Internal Medicine

## 2019-09-23 ENCOUNTER — Other Ambulatory Visit: Payer: Self-pay

## 2019-09-23 ENCOUNTER — Telehealth: Payer: Self-pay

## 2019-09-23 DIAGNOSIS — R778 Other specified abnormalities of plasma proteins: Secondary | ICD-10-CM | POA: Insufficient documentation

## 2019-09-23 DIAGNOSIS — M069 Rheumatoid arthritis, unspecified: Secondary | ICD-10-CM | POA: Diagnosis not present

## 2019-09-23 DIAGNOSIS — T8451XD Infection and inflammatory reaction due to internal right hip prosthesis, subsequent encounter: Secondary | ICD-10-CM | POA: Diagnosis not present

## 2019-09-23 DIAGNOSIS — R7989 Other specified abnormal findings of blood chemistry: Secondary | ICD-10-CM

## 2019-09-23 NOTE — Assessment & Plan Note (Signed)
Now that her infection is under good control I have no reason for her to not restart methotrexate and Simponi Aria.

## 2019-09-23 NOTE — Addendum Note (Signed)
Addended by: Cliffton Asters on: 09/23/2019 12:54 PM   Modules accepted: Orders

## 2019-09-23 NOTE — Telephone Encounter (Addendum)
Verbal orders given to [facility] with [facility] to discontinue vancomycin for now, but leave her PICC line per Dr. Orvan Falconer. [name] verbalized understanding.  Doyce Stonehouse T Pricilla Loveless

## 2019-09-23 NOTE — Progress Notes (Addendum)
[facility]  Patient Active Problem List   Diagnosis Date Noted  . 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 Enterococcal infection 08/24/2019    Priority: High  . Rheumatoid arthritis (HCC) 08/24/2019  . 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 Acute deep vein thrombosis (DVT) of right lower extremity (HCC) 04/01/2017  . Epistaxis 03/14/2017  . 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 Avascular necrosis of bone of hip, right (HCC) 12/18/2016  . Trochanteric bursitis, right hip 11/04/2016  . Pain of right hip joint 11/04/2016  . S/P lumbar spinal fusion 07/21/2015    Patient's Medications  New Prescriptions   No medications on file  Previous Medications   ACETAMINOPHEN (TYLENOL) 325 MG TABLET    Take 2 tablets (650 mg total) by mouth every 6 (six) hours as needed for mild pain (pain score 1-3 or temp > 100.5).   APIXABAN (ELIQUIS) 2.5 MG TABS TABLET    Take 1 tablet (2.5 mg total) by mouth every 12 (twelve) hours.   CETIRIZINE (ZYRTEC) 10 MG TABLET    Take 10 mg by mouth daily.    CHOLECALCIFEROL (VITAMIN D) 50 MCG (2000 UT) TABLET    Take 2,000 Units by mouth daily.    DOCUSATE SODIUM (COLACE) 100 MG CAPSULE    Take 1 capsule (100 mg total) by mouth 2 (two) times daily.   DULOXETINE (CYMBALTA) 30 MG CAPSULE    Take 60 mg by mouth daily.    EPIPEN 2-PAK 0.3 MG/0.3ML SOAJ INJECTION    Inject 0.3 mg into the muscle daily as needed. For anaphylactic allergic reaction   EZETIMIBE (ZETIA) 10 MG TABLET    Take 10 mg by mouth daily.   FENOFIBRATE 54 MG TABLET    Take 54 mg by mouth daily.   FLUCONAZOLE (DIFLUCAN) 100 MG TABLET    Take 1 tablet (100 mg total) by mouth daily.   FOLIC ACID (FOLVITE) 1 MG TABLET    Take 1 mg by mouth daily.   GABAPENTIN (NEURONTIN) 100 MG CAPSULE    Take 300 mg by mouth at bedtime.    HYDROXYZINE  (ATARAX/VISTARIL) 25 MG TABLET    Take 25 mg by mouth daily.   LATANOPROST (XALATAN) 0.005 % OPHTHALMIC SOLUTION    Place 1 drop into both eyes at bedtime.    NON FORMULARY    Inject 1 Syringe into the skin 2 (two) times a week. Allergy Shots   ONDANSETRON (ZOFRAN) 4 MG TABLET    Take 1 tablet (4 mg total) by mouth every 6 (six) hours as needed for nausea.   OXYCODONE (OXY IR/ROXICODONE) 5 MG IMMEDIATE RELEASE TABLET    Take 1 tablet (5 mg total) by mouth every 4 (four) hours as needed for moderate pain (pain score 4-6).   PIOGLITAZONE (ACTOS) 30 MG TABLET    Take 30 mg by mouth daily.   SENNA (SENOKOT) 8.6 MG TABS TABLET    Take 2 tablets (17.2 mg total) by mouth at bedtime.   SUMATRIPTAN (IMITREX) 100 MG TABLET    Take 1 tablet (100 mg total) by mouth once as needed. May repeat in 2 hours if headache persists or recurs.   TOPIRAMATE (TOPAMAX) 50 MG TABLET    Take 1 tablet (50 mg total) by mouth at bedtime.   VANCOMYCIN (VANCOCIN) 1-5 GM/200ML-%  SOLN    Inject 200 mLs (1,000 mg total) into the vein daily.  Modified Medications   No medications on file  Discontinued Medications   No medications on file    Subjective: [name] is in for her routine follow-up visit. She has rheumatoid arthritis and underwent right total hip arthroplasty in December 2018. She has had graduallyworsening right hip pain over the last several years. Her bone scan last December showed signs of loosening of the femoral component. She was admitted on 07/28/2019 and underwent revision of the femoral component. There was no obvious purulence noted at the time of surgery but operative cultures grew Enterococcus. A PICC was placed and she was discharged on a continuous infusion of ampicillin (she had a remote history of allergic reaction to amoxicillin but tolerated an amoxicillin challenge in the hospital).  She was doing well when I saw her on 08/25/2019.  3 days later she developed a red rash on her chest. Over the next 3  days it slowly spread to her forearms.  It did not itch. I changed her ampicillin to IV vancomycin.  The rash resolved promptly.  She has now completed 8 weeks of total IV antibiotic therapy.  Her incisions have healed.  She has not had any problems tolerating her PICC line or vancomycin.  Her right hip pain is much improved.  She has not restarted her RA meds.  She feels that her RA may be only slightly worse since stopping those medications.  She now tells me that she tolerated amoxicillin on many occasions as an adult.  She also says she is not sure if she would actually called the redness she had on her chest a rash.  She has very limited options for oral antibiotics to continue treatment of her prosthetic hip infection and she is here today to undergo another amoxicillin challenge.  Review of Systems: Review of Systems  Constitutional: Negative for chills, diaphoresis and fever.  Gastrointestinal: Negative for abdominal pain, diarrhea, nausea and vomiting.  Musculoskeletal: Positive for joint pain.  Skin: Negative for itching and rash.    Past Medical History:  Diagnosis Date  . Chronic fatigue   . Depression   . Diabetes mellitus without complication (HCC)   . Early cataracts, bilateral   . Fibromyalgia   . Glaucoma   . Headache    Vestibular migraines  . History of bronchitis   . History of pneumonia   . Hypercholesterolemia   . RA (rheumatoid arthritis) (HCC)   . RA (rheumatoid arthritis) (HCC)   . TIA (transient ischemic attack)   . Vertigo   . Wears glasses     Social History   Tobacco Use  . Smoking status: Never Smoker  . Smokeless tobacco: Never Used  Vaping Use  . Vaping Use: Never used  Substance Use Topics  . Alcohol use: No  . Drug use: No    Family History  Problem Relation Age of Onset  . Diabetes Mother   . Stroke Mother   . Heart disease Mother   . High blood pressure Father   . Stroke Father   . Diabetes Father   . Heart disease Father   .  Heart disease Sister     Allergies  Allergen Reactions  . Sulfa Antibiotics Other (See Comments)    Don't remember     Objective: Vitals:   09/23/19 1003  BP: (!) 144/76  Pulse: 84  Temp: 97.8 F (36.6 C)  TempSrc: Oral   There  is no height or weight on file to calculate BMI.  Physical Exam Constitutional:      Comments: She is in good spirits.  She is seated in her wheelchair.  She is accompanied by her daughter.  Cardiovascular:     Rate and Rhythm: Normal rate and regular rhythm.     Heart sounds: No murmur heard.   Pulmonary:     Effort: Pulmonary effort is normal.     Breath sounds: Normal breath sounds.  Musculoskeletal:     Comments: Right hip incision healed.  Skin:    Comments: Left arm PICC site looks good.  Psychiatric:        Mood and Affect: Mood normal.     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  I observed her taking 250 mg of amoxicillin here in clinic today.  She did not have any reaction over a 45-minute.  I instructed her to take a 500 mg capsule of amoxicillin every morning for the next week.  I will discontinue vancomycin now but leave her PICC line in until we know she can tolerate amoxicillin.  I asked him to contact me in 1 week to let me know how she is doing.        Unprioritized   Rheumatoid arthritis (HCC)    Now that her infection is under good control I have no reason for her to not restart methotrexate and Simponi Aria.          Cliffton Asters, MD [facility] [phone] pager   [phone] cell 09/23/2019, 12:49 PM

## 2019-09-23 NOTE — Assessment & Plan Note (Signed)
She is doing well on therapy for enterococcal right prosthetic hip infection.  I observed her taking 250 mg of amoxicillin here in clinic today.  She did not have any reaction over a 45-minute.  I instructed her to take a 500 mg capsule of amoxicillin every morning for the next week.  I will discontinue vancomycin now but leave her PICC line in until we know she can tolerate amoxicillin.  I asked him to contact me in 1 week to let me know how she is doing.

## 2019-09-30 ENCOUNTER — Ambulatory Visit: Payer: Medicare PPO | Admitting: Internal Medicine

## 2019-10-01 ENCOUNTER — Telehealth: Payer: Self-pay | Admitting: Internal Medicine

## 2019-10-01 ENCOUNTER — Other Ambulatory Visit: Payer: Self-pay | Admitting: Internal Medicine

## 2019-10-01 DIAGNOSIS — T8451XD Infection and inflammatory reaction due to internal right hip prosthesis, subsequent encounter: Secondary | ICD-10-CM

## 2019-10-01 MED ORDER — AMOXICILLIN 500 MG PO CAPS: 500.0000 mg | ORAL_CAPSULE | Freq: Two times a day (BID) | ORAL | 11 refills | Status: DC

## 2019-10-01 NOTE — Telephone Encounter (Signed)
I called and spoke with [name] today.  She has been taking amoxicillin 500 mg daily for the past week.  She has not had any new rash or other difficulty tolerating it.  I instructed her to start taking it twice daily and will give orders to have her PICC line removed.  She will follow-up with me on 10/07/2019.

## 2019-10-04 NOTE — Telephone Encounter (Signed)
[facility] called office today to follow up on pull picc orders. Per late phone note MD okay to have picc removed. Larita Fife was able to take a verbal order. Lorenso Courier, [place]

## 2019-10-06 ENCOUNTER — Telehealth: Payer: Self-pay

## 2019-10-06 NOTE — Telephone Encounter (Signed)
COVID-19 Pre-Screening Questions:10/07/19  Do you currently have a fever (>100 F), chills or unexplained body aches? NO  Are you currently experiencing new cough, shortness of breath, sore throat, runny nose? NO   Have you been in contact with someone that is currently pending confirmation of Covid19 testing or has been confirmed to have the Covid19 virus?NO  **If the patient answers NO to ALL questions -  advise the patient to please call the clinic before coming to the office should any symptoms develop.

## 2019-10-07 ENCOUNTER — Ambulatory Visit: Payer: Medicare PPO | Admitting: Internal Medicine

## 2019-10-07 ENCOUNTER — Other Ambulatory Visit: Payer: Self-pay

## 2019-10-07 ENCOUNTER — Encounter: Payer: Self-pay | Admitting: Internal Medicine

## 2019-10-07 DIAGNOSIS — T8451XD Infection and inflammatory reaction due to internal right hip prosthesis, subsequent encounter: Secondary | ICD-10-CM | POA: Diagnosis not present

## 2019-10-07 NOTE — Progress Notes (Signed)
[facility]  Patient Active Problem List   Diagnosis Date Noted  . 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 Enterococcal infection 08/24/2019    Priority: High  . Elevated troponin 09/23/2019  . Rheumatoid arthritis (HCC) 08/24/2019  . 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 Acute deep vein thrombosis (DVT) of right lower extremity (HCC) 04/01/2017  . Epistaxis 03/14/2017  . 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 Avascular necrosis of bone of hip, right (HCC) 12/18/2016  . Trochanteric bursitis, right hip 11/04/2016  . Pain of right hip joint 11/04/2016  . S/P lumbar spinal fusion 07/21/2015    Patient's Medications  New Prescriptions   No medications on file  Previous Medications   ACETAMINOPHEN (TYLENOL) 325 MG TABLET    Take 2 tablets (650 mg total) by mouth every 6 (six) hours as needed for mild pain (pain score 1-3 or temp > 100.5).   AMOXICILLIN (AMOXIL) 500 MG CAPSULE    Take 1 capsule (500 mg total) by mouth 2 (two) times daily.   APIXABAN (ELIQUIS) 2.5 MG TABS TABLET    Take 1 tablet (2.5 mg total) by mouth every 12 (twelve) hours.   CETIRIZINE (ZYRTEC) 10 MG TABLET    Take 10 mg by mouth daily.    CHOLECALCIFEROL (VITAMIN D) 50 MCG (2000 UT) TABLET    Take 2,000 Units by mouth daily.    DOCUSATE SODIUM (COLACE) 100 MG CAPSULE    Take 1 capsule (100 mg total) by mouth 2 (two) times daily.   DULOXETINE (CYMBALTA) 30 MG CAPSULE    Take 60 mg by mouth daily.    EPIPEN 2-PAK 0.3 MG/0.3ML SOAJ INJECTION    Inject 0.3 mg into the muscle daily as needed. For anaphylactic allergic reaction   EZETIMIBE (ZETIA) 10 MG TABLET    Take 10 mg by mouth daily.   FENOFIBRATE 54 MG TABLET    Take 54 mg by mouth daily.   FLUCONAZOLE (DIFLUCAN) 100 MG TABLET    Take 1 tablet (100 mg total) by mouth daily.   FOLIC ACID (FOLVITE)  1 MG TABLET    Take 1 mg by mouth daily.   GABAPENTIN (NEURONTIN) 100 MG CAPSULE    Take 300 mg by mouth at bedtime.    GOLIMUMAB (SIMPONI ARIA) 50 MG/4ML SOLN INJECTION    Inject 50 mg into the vein every 8 (eight) weeks.   HYDROCODONE-ACETAMINOPHEN (NORCO) 7.5-325 MG TABLET    Take 1-4 tablets by mouth every 6 (six) hours as needed for moderate pain.   HYDROXYZINE (ATARAX/VISTARIL) 25 MG TABLET    Take 25 mg by mouth daily.   LATANOPROST (XALATAN) 0.005 % OPHTHALMIC SOLUTION    Place 1 drop into both eyes at bedtime.    METHOTREXATE 250 MG/10ML INJECTION    methotrexate sodium 25 mg/mL injection solution   NON FORMULARY    Inject 1 Syringe into the skin 2 (two) times a week. Allergy Shots   ONDANSETRON (ZOFRAN) 4 MG TABLET    Take 1 tablet (4 mg total) by mouth every 6 (six) hours as needed for nausea.   OXYCODONE (OXY IR/ROXICODONE) 5 MG IMMEDIATE RELEASE TABLET    Take 1 tablet (5 mg total) by mouth every 4 (four) hours as needed for moderate pain (pain score 4-6).   PIOGLITAZONE (ACTOS) 30 MG  TABLET    Take 30 mg by mouth daily.   SENNA (SENOKOT) 8.6 MG TABS TABLET    Take 2 tablets (17.2 mg total) by mouth at bedtime.   SUMATRIPTAN (IMITREX) 100 MG TABLET    Take 1 tablet (100 mg total) by mouth once as needed. May repeat in 2 hours if headache persists or recurs.   TOPIRAMATE (TOPAMAX) 50 MG TABLET    Take 1 tablet (50 mg total) by mouth at bedtime.  Modified Medications   No medications on file  Discontinued Medications   No medications on file    Subjective: [name] is in for her routine follow-up visit.  She is accompanied by her daughter.  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  Fortunately she has not had any rash or other adverse reaction to amoxicillin.  She is back on her allergy medications and rheumatoid arthritis medications.  She is still having pain in her right hip and generalized joint pain but overall is  feeling better and making slow progress with physical therapy.  Review of Systems: Review of Systems  Constitutional: Negative for chills, diaphoresis and fever.  Respiratory: Negative for cough and shortness of breath.   Cardiovascular: Negative for chest pain.  Gastrointestinal: Negative for abdominal pain, diarrhea, nausea and vomiting.  Musculoskeletal: Positive for joint pain.  Skin: Positive for itching. Negative for rash.    Past Medical History:  Diagnosis Date  . Chronic fatigue   . Depression   . Diabetes mellitus without complication (HCC)   . Early cataracts, bilateral   . Fibromyalgia   . Glaucoma   . Headache    Vestibular migraines  . History of bronchitis   . History of pneumonia   . Hypercholesterolemia   . RA (rheumatoid arthritis) (HCC)   . RA (rheumatoid arthritis) (HCC)   . TIA (transient ischemic attack)   . Vertigo   . Wears glasses     Social History   Tobacco Use  . Smoking status: Never Smoker  . Smokeless tobacco: Never Used  Vaping Use  . Vaping Use: Never used  Substance Use Topics  . Alcohol use: No  . Drug use: No    Family History  Problem Relation Age of Onset  . Diabetes Mother   . Stroke Mother   . Heart disease Mother   . High blood pressure Father   . Stroke Father   . Diabetes Father   . Heart disease Father   . Heart disease Sister     Allergies  Allergen Reactions  . Sulfa Antibiotics Other (See Comments)    Don't remember     Objective: Vitals:   10/07/19 1030  BP: 131/76  Pulse: (!) 58  Temp: 97.9 F (36.6 C)  TempSrc: Oral  SpO2: 100%   There is no height or weight on file to calculate BMI.  Physical Exam Constitutional:      Comments: She looks like she is feeling much better.  Cardiovascular:     Rate and Rhythm: Normal rate and regular rhythm.     Heart sounds: No murmur heard.   Pulmonary:     Effort: Pulmonary effort is normal.     Breath sounds: Normal breath sounds.  Abdominal:      Palpations: Abdomen is soft.     Tenderness: There is no abdominal tenderness.  Musculoskeletal:     Comments: Right hip incision has healed.  Skin:    Findings: No rash.  Psychiatric:        Mood and Affect: Mood normal.     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  She will follow-up in 3 months.          Cliffton Asters, MD [facility] [phone] pager   [phone] cell 10/07/2019, 10:40 AM

## 2019-10-07 NOTE — Assessment & Plan Note (Signed)
I recommend that she continue amoxicillin indefinitely for her smoldering right prosthetic hip infection.  She will follow-up in 3 months.

## 2019-11-03 ENCOUNTER — Ambulatory Visit: Payer: Medicare PPO | Admitting: Adult Health

## 2019-11-29 ENCOUNTER — Ambulatory Visit: Payer: Medicare PPO | Admitting: Interventional Cardiology

## 2019-11-29 DIAGNOSIS — Z86718 Personal history of other venous thrombosis and embolism: Secondary | ICD-10-CM

## 2019-11-29 DIAGNOSIS — Z7189 Other specified counseling: Secondary | ICD-10-CM

## 2019-11-29 DIAGNOSIS — R778 Other specified abnormalities of plasma proteins: Secondary | ICD-10-CM

## 2019-11-29 DIAGNOSIS — M7989 Other specified soft tissue disorders: Secondary | ICD-10-CM

## 2019-12-13 ENCOUNTER — Telehealth: Payer: Self-pay

## 2019-12-13 NOTE — Telephone Encounter (Signed)
Received voicemail from patient stating she believes she has a UTI. Is currently experiencing dysuria, and pressure. Is taking Fluconazole. Is on 3rd tablet out of 7. States that she feels like her symptoms are getting worse. Would like to know if MD will recommend anything else to help with symptoms.

## 2019-12-14 ENCOUNTER — Other Ambulatory Visit: Payer: Medicare PPO

## 2019-12-14 ENCOUNTER — Other Ambulatory Visit: Payer: Self-pay

## 2019-12-14 ENCOUNTER — Other Ambulatory Visit: Payer: Self-pay | Admitting: Internal Medicine

## 2019-12-14 DIAGNOSIS — R300 Dysuria: Secondary | ICD-10-CM

## 2019-12-14 NOTE — Telephone Encounter (Signed)
Patient is scheduled for lab appointment today at 2:15. Does not have any questions at this time.  [name]

## 2019-12-14 NOTE — Telephone Encounter (Signed)
Please ask her to come in and give a urine sample for UA and culture. I have placed orders.

## 2019-12-16 ENCOUNTER — Telehealth: Payer: Self-pay | Admitting: Internal Medicine

## 2019-12-16 LAB — URINALYSIS, ROUTINE W REFLEX MICROSCOPIC
Bacteria, UA: NONE SEEN /HPF
Bilirubin Urine: NEGATIVE
Glucose, UA: NEGATIVE
Hgb urine dipstick: NEGATIVE
Ketones, ur: NEGATIVE
Nitrite: NEGATIVE
Protein, ur: NEGATIVE
Specific Gravity, Urine: 1.022 (ref 1.001–1.03)
pH: <=5 (ref 5.0–8.0)

## 2019-12-16 LAB — URINE CULTURE
MICRO NUMBER:: 11185334
Result:: NO GROWTH
SPECIMEN QUALITY:: ADEQUATE

## 2019-12-16 NOTE — Telephone Encounter (Signed)
-----   Message from Cliffton Asters, MD sent at 12/14/2019  8:47 AM EST ----- Call her with results of UA and culture.

## 2020-01-06 ENCOUNTER — Telehealth: Payer: Medicare PPO | Admitting: Internal Medicine

## 2020-01-12 ENCOUNTER — Encounter: Payer: Self-pay | Admitting: Internal Medicine

## 2020-01-12 ENCOUNTER — Ambulatory Visit (INDEPENDENT_AMBULATORY_CARE_PROVIDER_SITE_OTHER): Payer: Medicare PPO | Admitting: Internal Medicine

## 2020-01-12 ENCOUNTER — Other Ambulatory Visit: Payer: Self-pay

## 2020-01-12 DIAGNOSIS — T8451XD Infection and inflammatory reaction due to internal right hip prosthesis, subsequent encounter: Secondary | ICD-10-CM | POA: Diagnosis not present

## 2020-01-12 NOTE — Progress Notes (Signed)
[facility]  Patient Active Problem List   Diagnosis Date Noted  . Dysuria 12/14/2019    Priority: High  . 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 Enterococcal infection 08/24/2019    Priority: High  . Elevated troponin 09/23/2019  . Rheumatoid arthritis (HCC) 08/24/2019  . 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 Acute deep vein thrombosis (DVT) of right lower extremity (HCC) 04/01/2017  . Epistaxis 03/14/2017  . 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 Avascular necrosis of bone of hip, right (HCC) 12/18/2016  . Trochanteric bursitis, right hip 11/04/2016  . Pain of right hip joint 11/04/2016  . S/P lumbar spinal fusion 07/21/2015    Patient's Medications  New Prescriptions   No medications on file  Previous Medications   ACETAMINOPHEN (TYLENOL) 325 MG TABLET    Take 2 tablets (650 mg total) by mouth every 6 (six) hours as needed for mild pain (pain score 1-3 or temp > 100.5).   AMOXICILLIN (AMOXIL) 500 MG CAPSULE    Take 1 capsule (500 mg total) by mouth 2 (two) times daily.   APIXABAN (ELIQUIS) 2.5 MG TABS TABLET    Take 1 tablet (2.5 mg total) by mouth every 12 (twelve) hours.   CETIRIZINE (ZYRTEC) 10 MG TABLET    Take 10 mg by mouth daily.    CHOLECALCIFEROL (VITAMIN D) 50 MCG (2000 UT) TABLET    Take 2,000 Units by mouth daily.    DOCUSATE SODIUM (COLACE) 100 MG CAPSULE    Take 1 capsule (100 mg total) by mouth 2 (two) times daily.   DULOXETINE (CYMBALTA) 30 MG CAPSULE    Take 60 mg by mouth daily.    EPIPEN 2-PAK 0.3 MG/0.3ML SOAJ INJECTION    Inject 0.3 mg into the muscle daily as needed. For anaphylactic allergic reaction   EZETIMIBE (ZETIA) 10 MG TABLET    Take 10 mg by mouth daily.   FENOFIBRATE 54 MG TABLET    Take 54 mg by mouth daily.   FLUCONAZOLE (DIFLUCAN) 100 MG TABLET    Take 1 tablet (100 mg  total) by mouth daily.   FOLIC ACID (FOLVITE) 1 MG TABLET    Take 1 mg by mouth daily.   GABAPENTIN (NEURONTIN) 100 MG CAPSULE    Take 300 mg by mouth at bedtime.    GOLIMUMAB (SIMPONI ARIA) 50 MG/4ML SOLN INJECTION    Inject 50 mg into the vein every 8 (eight) weeks.   HYDROCODONE-ACETAMINOPHEN (NORCO) 7.5-325 MG TABLET    Take 1-4 tablets by mouth every 6 (six) hours as needed for moderate pain.   HYDROXYZINE (ATARAX/VISTARIL) 25 MG TABLET    Take 25 mg by mouth daily.   LATANOPROST (XALATAN) 0.005 % OPHTHALMIC SOLUTION    Place 1 drop into both eyes at bedtime.    METHOTREXATE 250 MG/10ML INJECTION    methotrexate sodium 25 mg/mL injection solution   METHOTREXATE 50 MG/2ML INJECTION       NON FORMULARY    Inject 1 Syringe into the skin 2 (two) times a week. Allergy Shots   ONDANSETRON (ZOFRAN) 4 MG TABLET    Take 1 tablet (4 mg total) by mouth every 6 (six) hours as needed for nausea.   OXYCODONE (OXY IR/ROXICODONE) 5 MG IMMEDIATE RELEASE TABLET    Take 1 tablet (5 mg total) by  mouth every 4 (four) hours as needed for moderate pain (pain score 4-6).   PIOGLITAZONE (ACTOS) 30 MG TABLET    Take 30 mg by mouth daily.   SENNA (SENOKOT) 8.6 MG TABS TABLET    Take 2 tablets (17.2 mg total) by mouth at bedtime.   SUMATRIPTAN (IMITREX) 100 MG TABLET    Take 1 tablet (100 mg total) by mouth once as needed. May repeat in 2 hours if headache persists or recurs.   TOPIRAMATE (TOPAMAX) 50 MG TABLET    Take 1 tablet (50 mg total) by mouth at bedtime.  Modified Medications   No medications on file  Discontinued Medications   No medications on file    Subjective: [name] is in for her routine follow-up visit. 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  Fortunately she has not had any rash or other adverse reaction to amoxicillin.  She is back on her allergy medications and rheumatoid arthritis medications.    She has been having more pain in  her right lateral hip and generalized arthralgias.  Because of concerns about the possibility of worsening right hip infection, Dr. Nickola Major her rheumatologist held her Simponi Aria infusion recently.  She saw her orthopedist, Dr. Linna Caprice yesterday.  Apparently no loosening of the current prosthesis was noted on x-ray.  She was told that she had bursitis and was given a steroid injection.  Her pain has improved.  Review of Systems: Review of Systems  Constitutional: Negative for chills, diaphoresis and fever.  Respiratory: Negative for cough and shortness of breath.   Cardiovascular: Negative for chest pain.  Gastrointestinal: Negative for abdominal pain, diarrhea, nausea and vomiting.  Musculoskeletal: Positive for joint pain.  Skin: Negative for itching and rash.    Past Medical History:  Diagnosis Date  . Chronic fatigue   . Depression   . Diabetes mellitus without complication (HCC)   . Early cataracts, bilateral   . Fibromyalgia   . Glaucoma   . Headache    Vestibular migraines  . History of bronchitis   . History of pneumonia   . Hypercholesterolemia   . RA (rheumatoid arthritis) (HCC)   . RA (rheumatoid arthritis) (HCC)   . TIA (transient ischemic attack)   . Vertigo   . Wears glasses     Social History   Tobacco Use  . Smoking status: Never Smoker  . Smokeless tobacco: Never Used  Vaping Use  . Vaping Use: Never used  Substance Use Topics  . Alcohol use: No  . Drug use: No    Family History  Problem Relation Age of Onset  . Diabetes Mother   . Stroke Mother   . Heart disease Mother   . High blood pressure Father   . Stroke Father   . Diabetes Father   . Heart disease Father   . Heart disease Sister     Allergies  Allergen Reactions  . Sulfa Antibiotics Other (See Comments)    Don't remember     Objective: Vitals:   01/12/20 1108  BP: (!) 141/78  Pulse: 66  Temp: 97.6 F (36.4 C)  TempSrc: Oral  SpO2: 99%  Weight: 180 lb (81.6 kg)  Height:  5\' 1"  (1.549 m)   Body mass index is 34.01 kg/m.  Physical Exam Constitutional:      Comments: She is in good spirits.  Cardiovascular:     Rate and Rhythm: Normal rate.     Heart sounds: No  murmur heard.   Pulmonary:     Effort: Pulmonary effort is normal.  Abdominal:     Palpations: Abdomen is soft.     Tenderness: There is no abdominal tenderness.  Musculoskeletal:     Comments: Right hip incision has healed.  Very mild pain with palpation over her right hip laterally.  There is no unusual redness, warmth or swelling.  Skin:    Findings: No rash.  Psychiatric:        Mood and Affect: Mood normal.     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  Her inflammatory markers have normalized as of August.  I suspect that she does have trochanteric bursitis but not a flare of her infection.  I am okay with continuing her full rheumatoid arthritis regimen for now.  I discussed with her again the fact that the only test of cure for her infection would be to eventually stop her amoxicillin and wait to see what happens.  Her history suggests that her infection had been smoldering for quite some time before her surgery in June.  I favor repeating her inflammatory markers today and continuing amoxicillin for now.  She will follow-up here in 2 months.      Relevant Orders   C-reactive protein   Sedimentation rate       Cliffton Asters, MD [facility] [phone] pager   [phone] cell 01/12/2020, 11:28 AM

## 2020-01-12 NOTE — Assessment & Plan Note (Signed)
I believe that her enterococcal prosthetic hip infection has improved following surgery in June and 6 months of antibiotics.  Her inflammatory markers have normalized as of August.  I suspect that she does have trochanteric bursitis but not a flare of her infection.  I am okay with continuing her full rheumatoid arthritis regimen for now.  I discussed with her again the fact that the only test of cure for her infection would be to eventually stop her amoxicillin and wait to see what happens.  Her history suggests that her infection had been smoldering for quite some time before her surgery in June.  I favor repeating her inflammatory markers today and continuing amoxicillin for now.  She will follow-up here in 2 months.

## 2020-01-13 ENCOUNTER — Ambulatory Visit: Payer: Medicare PPO | Admitting: Interventional Cardiology

## 2020-01-13 LAB — SEDIMENTATION RATE: Sed Rate: 14 mm/h (ref 0–30)

## 2020-01-13 LAB — C-REACTIVE PROTEIN: CRP: 0.9 mg/L (ref <8.0)

## 2020-01-25 ENCOUNTER — Other Ambulatory Visit: Payer: Self-pay | Admitting: Family Medicine

## 2020-01-25 DIAGNOSIS — E2839 Other primary ovarian failure: Secondary | ICD-10-CM

## 2020-03-15 ENCOUNTER — Ambulatory Visit: Payer: Medicare PPO | Admitting: Internal Medicine

## 2020-03-23 ENCOUNTER — Other Ambulatory Visit: Payer: Self-pay

## 2020-03-23 ENCOUNTER — Ambulatory Visit: Payer: Medicare PPO | Admitting: Internal Medicine

## 2020-03-23 ENCOUNTER — Encounter: Payer: Self-pay | Admitting: Internal Medicine

## 2020-03-23 DIAGNOSIS — T8451XD Infection and inflammatory reaction due to internal right hip prosthesis, subsequent encounter: Secondary | ICD-10-CM | POA: Diagnosis not present

## 2020-03-23 DIAGNOSIS — M069 Rheumatoid arthritis, unspecified: Secondary | ICD-10-CM

## 2020-03-23 DIAGNOSIS — J209 Acute bronchitis, unspecified: Secondary | ICD-10-CM | POA: Diagnosis not present

## 2020-03-23 NOTE — Assessment & Plan Note (Signed)
She has been off of her rheumatoid arthritis meds since she developed acute bronchitis but feel that it is safe to restart them within the next week.

## 2020-03-23 NOTE — Assessment & Plan Note (Signed)
Her history suggests chronic, smoldering enterococcal prosthetic hip infection.  She had a new femoral stem placed last June.  Her inflammatory markers have normalized.  Unfortunately the only test of cure would be to stop her amoxicillin and wait to see what happens.  If her infection is simply being suppressed will flareup once amoxicillin is stopped and probably lead to repeat hospitalization and further surgery.  She prefers to continue amoxicillin for now.  She will follow-up in 2 months.

## 2020-03-23 NOTE — Assessment & Plan Note (Signed)
She had a recent bout of acute bronchitis.  Given her close proximity to her daughter who was diagnosed with Covid infection I think it is quite possible that she also had Covid with a falsely negative home swab test.  She does not have any evidence of pneumonia or other severe complications now.  I expect that her fatigue will slowly improve.
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
## 2020-03-29 ENCOUNTER — Telehealth: Payer: Self-pay

## 2020-03-29 NOTE — Telephone Encounter (Signed)
Received call from patient complaints of dry mouth. Advised patient to try biotene "dry mouth" mouthwash for some relief. Patient has already been increasing fluid intake.  Routing to provider for any additional advise.  Valarie Cones

## 2020-03-30 NOTE — Telephone Encounter (Signed)
I agree with the recommendation to have her try Biotene mouthwash.  I do not think that her dry mouth is related to her hip infection or amoxicillin therapy.  If she continues to have problems with dry mouth I would recommend that she speak with her PCP.

## 2020-03-30 NOTE — Telephone Encounter (Signed)
Patient called and made aware of Dr. Blair Dolphin advise.  Patient appreciative of call and staff at [facility]. Valarie Cones

## 2020-05-10 ENCOUNTER — Other Ambulatory Visit: Payer: Self-pay | Admitting: Adult Health

## 2020-05-16 ENCOUNTER — Telehealth: Payer: Self-pay | Admitting: Adult Health

## 2020-05-16 ENCOUNTER — Encounter: Payer: Self-pay | Admitting: *Deleted

## 2020-05-16 MED ORDER — TOPIRAMATE 50 MG PO TABS: 50.0000 mg | ORAL_TABLET | Freq: Every day | ORAL | 11 refills | Status: DC

## 2020-05-16 NOTE — Telephone Encounter (Signed)
Pt request refill topiramate (TOPAMAX) 50 MG tablet at [id number] Scheduled appt 05/17/20.

## 2020-05-17 ENCOUNTER — Encounter: Payer: Self-pay | Admitting: Adult Health

## 2020-05-17 ENCOUNTER — Ambulatory Visit: Payer: Medicare PPO | Admitting: Adult Health

## 2020-05-17 VITALS — BP 144/71 | HR 64 | Ht 62.0 in | Wt 188.0 lb

## 2020-05-17 DIAGNOSIS — G43809 Other migraine, not intractable, without status migrainosus: Secondary | ICD-10-CM | POA: Diagnosis not present

## 2020-05-17 NOTE — Progress Notes (Addendum)
PATIENT: [name] DOB: [birth year]  REASON FOR VISIT: follow up HISTORY FROM: patient  HISTORY OF PRESENT ILLNESS: Today 05/17/20:  [name] is a 75 year old female with a history of vestibular migraines.  She returns today for follow-up.  She is currently on Topamax 50 mg daily.  She reports that this works fairly well for her.  She states over the last year she is only had a couple of vestibular migraines.  She reports that occasionally she will have episodes of vertigo but this is typically quick and occurs when she is laying back in bed.  She denies any new symptoms.  She returns today for an evaluation.  04/27/19: [name] is a 75 year old female with a history of vestibular migraines.  She reports that she has done well on Qudexy.  She reports her insurance no longer covers this medication.  Since she has been taking it she is only had 1 vestibular migraine.  She states that she typically does not have any dizziness unless she has a migraine.  She would like to switch back to Topamax that she originally started with this medication and it worked well for her.  HISTORY  She continue to have vertigo. Migraines are improved, just one episode. She continues on the Topamax.  The new dizziness started 4 months ago. She was in PT for her hips and legs, when she laid back she noticed it. When she goes to bed and lay down that is when she feels it. Brief and the room spins, 1 minute, sitting still improves also dizzy in the morning. She is not drinking enough fluids.   HPI:  [name] is a 75 y.o. female here as a referral from Dr. Zachery Dauer for dizziness. Medical history migraines(since  hypertension, hypercholesterolemia, spinal stenosis, diabetes, osteoarthritis and polyarthropathy, trochanteric bursitis, vitamin D deficiency, glaucoma, rheumatoid arthritis, spondylolisthesis.October 17 she woke up with severe spinning, nausea. Then it happened again January 29th  worsening, she can incapacitated with a headache for 3 days. She has headaches at the temples with light sensitivity, nausea, pounding/throbbing, feels like her eyes are going to pop out. She has daily headache. She is taking 6-8 extra strength excedrin every day. Worse in the morning and at night. Headaches wake her up. She has gained 14 pounds since July. Blurry vision and vertigo. Severe, continuous, daily headaches. She has neck pain.No other focal neurologic deficits, associated symptoms, inciting events or modifiable factors.  Reviewed notes, labs and imaging from outside physicians, which showed:  CT head showed No acute intracranial abnormalities including mass lesion or mass effect, hydrocephalus, extra-axial fluid collection, midline shift, hemorrhage, or acute infarction, large ischemic events (personally reviewed images)   REVIEW OF SYSTEMS: Out of a complete 14 system review of symptoms, the patient complains only of the following symptoms, and all other reviewed systems are negative.   See HPI  ALLERGIES: Allergies  Allergen Reactions  . Sulfa Antibiotics Other (See Comments)    Don't remember     HOME MEDICATIONS: Outpatient Medications Prior to Visit  Medication Sig Dispense Refill  . acetaminophen (TYLENOL) 325 MG tablet Take 2 tablets (650 mg total) by mouth every 6 (six) hours as needed for mild pain (pain score 1-3 or temp > 100.5). 120 tablet 1  . amoxicillin (AMOXIL) 500 MG capsule Take 1 capsule (500 mg total) by mouth 2 (two) times daily. 60 capsule 11  . cetirizine (ZYRTEC) 10 MG tablet Take 10 mg by mouth daily.     Marland Kitchen  Cholecalciferol (VITAMIN D) 50 MCG (2000 UT) tablet Take 2,000 Units by mouth daily.     Marland Kitchen docusate sodium (COLACE) 100 MG capsule Take 1 capsule (100 mg total) by mouth 2 (two) times daily. 60 capsule 1  . DULoxetine (CYMBALTA) 30 MG capsule Take 60 mg by mouth daily.     Marland Kitchen EPIPEN 2-PAK 0.3 MG/0.3ML SOAJ injection Inject 0.3 mg into the muscle  daily as needed. For anaphylactic allergic reaction  1  . ezetimibe (ZETIA) 10 MG tablet Take 10 mg by mouth daily.    . fenofibrate 54 MG tablet Take 54 mg by mouth daily.    . folic acid (FOLVITE) 1 MG tablet Take 1 mg by mouth daily.    Marland Kitchen gabapentin (NEURONTIN) 100 MG capsule Take 300 mg by mouth at bedtime.     Marland Kitchen golimumab (SIMPONI ARIA) 50 MG/4ML SOLN injection Inject 50 mg into the vein every 8 (eight) weeks.    Marland Kitchen HYDROcodone-acetaminophen (NORCO) 7.5-325 MG tablet Take 1-4 tablets by mouth every 6 (six) hours as needed for moderate pain.    . hydrOXYzine (ATARAX/VISTARIL) 25 MG tablet Take 25 mg by mouth daily.  1  . latanoprost (XALATAN) 0.005 % ophthalmic solution Place 1 drop into both eyes at bedtime.     . methotrexate 250 MG/10ML injection methotrexate sodium 25 mg/mL injection solution    . methotrexate 50 MG/2ML injection     . NON FORMULARY Inject 1 Syringe into the skin 2 (two) times a week. Allergy Shots    . ondansetron (ZOFRAN) 4 MG tablet Take 1 tablet (4 mg total) by mouth every 6 (six) hours as needed for nausea. 20 tablet 0  . oxyCODONE (OXY IR/ROXICODONE) 5 MG immediate release tablet Take 1 tablet (5 mg total) by mouth every 4 (four) hours as needed for moderate pain (pain score 4-6). 42 tablet 0  . pioglitazone (ACTOS) 30 MG tablet Take 30 mg by mouth daily.    . SUMAtriptan (IMITREX) 100 MG tablet Take 1 tablet (100 mg total) by mouth once as needed. May repeat in 2 hours if headache persists or recurs. 10 tablet 12  . topiramate (TOPAMAX) 50 MG tablet Take 1 tablet (50 mg total) by mouth at bedtime. 30 tablet 11  . apixaban (ELIQUIS) 2.5 MG TABS tablet Take 1 tablet (2.5 mg total) by mouth every 12 (twelve) hours. (Patient not taking: No sig reported) 60 tablet 0  . fluconazole (DIFLUCAN) 100 MG tablet Take 1 tablet (100 mg total) by mouth daily. 7 tablet 5  . senna (SENOKOT) 8.6 MG TABS tablet Take 2 tablets (17.2 mg total) by mouth at bedtime. 60 tablet 1   No  facility-administered medications prior to visit.    PAST MEDICAL HISTORY: Past Medical History:  Diagnosis Date  . Chronic fatigue   . Depression   . Diabetes mellitus without complication (HCC)   . Early cataracts, bilateral   . Fibromyalgia   . Glaucoma   . Headache    Vestibular migraines  . History of bronchitis   . History of pneumonia   . Hypercholesterolemia   . RA (rheumatoid arthritis) (HCC)   . RA (rheumatoid arthritis) (HCC)   . TIA (transient ischemic attack)   . Vertigo   . Wears glasses     PAST SURGICAL HISTORY: Past Surgical History:  Procedure Laterality Date  . APPENDECTOMY    . BACK SURGERY  2012, 2017  . BILATERAL CARPAL TUNNEL RELEASE    . CESAREAN SECTION  x3  . DILATION AND CURETTAGE OF UTERUS    . GLAUCOMA SURGERY    . KNEE ARTHROSCOPY Left   . MOUTH SURGERY    . TONSILLECTOMY    . TOTAL HIP ARTHROPLASTY Right 01/30/2017   Procedure: RIGHT TOTAL HIP ARTHROPLASTY ANTERIOR APPROACH;  Surgeon: Kathryne Hitch, MD;  Location: [facility];  Service: Orthopedics;  Laterality: Right;  . TOTAL HIP REVISION Right 07/28/2019   Procedure: TOTAL HIP REVISION, posterior approach femoal comp;  Surgeon: Samson Frederic, MD;  Location: [facility] OR;  Service: Orthopedics;  Laterality: Right;    FAMILY HISTORY: Family History  Problem Relation Age of Onset  . Diabetes Mother   . Stroke Mother   . Heart disease Mother   . High blood pressure Father   . Stroke Father   . Diabetes Father   . Heart disease Father   . Heart disease Sister     SOCIAL HISTORY: Social History   Socioeconomic History  . Marital status: Widowed    Spouse name: Not on file  . Number of children: 4  . Years of education: 74  . Highest education level: Not on file  Occupational History  . Occupation: Retired    Comment: Runner, broadcasting/film/video  Tobacco Use  . Smoking status: Never Smoker  . Smokeless tobacco: Never Used  Vaping Use  . Vaping Use: Never used  Substance and Sexual  Activity  . Alcohol use: No  . Drug use: No  . Sexual activity: Not on file  Other Topics Concern  . Not on file  Social History Narrative   Lives at home alone   Right-handed   Caffeine: tea daily   Has 10 grandchildren   Social Determinants of Health   Financial Resource Strain: Not on file  Food Insecurity: Not on file  Transportation Needs: Not on file  Physical Activity: Not on file  Stress: Not on file  Social Connections: Not on file  Intimate Partner Violence: Not on file      PHYSICAL EXAM  Vitals:   05/17/20 1411  BP: (!) 144/71  Pulse: 64  Weight: 188 lb (85.3 kg)  Height:  (1.575 m)   Body mass index is 34.39 kg/m.  Generalized: Well developed, in no acute distress   Neurological examination  Mentation: Alert oriented to time, place, history taking. Follows all commands speech and language fluent Cranial nerve II-XII: Pupils were equal round reactive to light. Extraocular movements were full, visual field were full on confrontational test. Uvula tongue midline. Head turning and shoulder shrug  were normal and symmetric. Motor: The motor testing reveals 5 over 5 strength of all 4 extremities. Good symmetric motor tone is noted throughout.  Sensory: Sensory testing is intact to soft touch on all 4 extremities. No evidence of extinction is noted.  Coordination: Cerebellar testing reveals good finger-nose-finger and heel-to-shin bilaterally.  Gait and station: Uses a cane when ambulating Reflexes: Deep tendon reflexes are symmetric and normal bilaterally.   DIAGNOSTIC DATA (LABS, IMAGING, TESTING) - I reviewed patient records, labs, notes, testing and imaging myself where available.  Lab Results  Component Value Date   WBC 7.4 09/14/2019   HGB 10.8 (L) 09/14/2019   HCT 35.6 (L) 09/14/2019   MCV 96.5 09/14/2019   PLT 374 09/14/2019      Component Value Date/Time   NA 139 09/14/2019 1752   K 3.8 09/14/2019 1752   CL 104 09/14/2019 1752   CO2  24 09/14/2019 1752   GLUCOSE 165 (  H) 09/14/2019 1752   BUN 10 09/14/2019 1752   CREATININE 0.92 09/14/2019 1752   CREATININE 0.84 09/08/2019 1323   CALCIUM 11.0 (H) 09/14/2019 1752   PROT 7.8 09/14/2019 1752   ALBUMIN 3.6 09/14/2019 1752   AST 23 09/14/2019 1752   ALT 11 09/14/2019 1752   ALKPHOS 84 09/14/2019 1752   BILITOT 0.3 09/14/2019 1752   GFRNONAA >60 09/14/2019 1752   GFRAA >60 09/14/2019 1752   Lab Results  Component Value Date   CHOL (H) 02/25/2007    217        ATP III CLASSIFICATION:  <200     mg/dL   Desirable  846-659  mg/dL   Borderline High  >=935    mg/dL   High   HDL 33 (L) 70/17/7939   LDLCALC (H) 02/25/2007    155        Total Cholesterol/HDL:CHD Risk Coronary Heart Disease Risk Table                     Men   Women  1/2 Average Risk   3.4   3.3   TRIG 144 02/25/2007   CHOLHDL 6.6 02/25/2007   Lab Results  Component Value Date   HGBA1C 6.7 (H) 07/20/2019      ASSESSMENT AND PLAN 75 y.o. year old female  has a past medical history of Chronic fatigue, Depression, Diabetes mellitus without complication (HCC), Early cataracts, bilateral, Fibromyalgia, Glaucoma, Headache, History of bronchitis, History of pneumonia, Hypercholesterolemia, RA (rheumatoid arthritis) (HCC), RA (rheumatoid arthritis) (HCC), TIA (transient ischemic attack), Vertigo, and Wears glasses. here with:  1.  Vestibular migraine  -Continue Topamax 50 mg at bedtime -Advised if headache frequency or dizziness increases she should let us know -Follow-up in 1 year or sooner if needed    Butch Penny, MSN, NP-C 05/17/2020, 2:45 PM [facility] [address], [address] [place], [place] [address] [phone]  Made any corrections needed, and agree with history, physical, neuro exam,assessment and plan as stated.     Naomie Dean, MD [facility]

## 2020-05-17 NOTE — Patient Instructions (Signed)
Your Plan:  Continue Topamax 50 mg daily If your symptoms worsen or you develop new symptoms please let us know.   Thank you for coming to see [place] at [facility]. I hope we have been able to provide you high quality care today.  You may receive a patient satisfaction survey over the next few weeks. We would appreciate your feedback and comments so that we may continue to improve ourselves and the health of our patients.

## 2020-05-25 ENCOUNTER — Encounter: Payer: Self-pay | Admitting: Internal Medicine

## 2020-05-25 ENCOUNTER — Other Ambulatory Visit: Payer: Self-pay

## 2020-05-25 ENCOUNTER — Ambulatory Visit: Payer: Medicare PPO | Admitting: Internal Medicine

## 2020-05-25 DIAGNOSIS — T8451XD Infection and inflammatory reaction due to internal right hip prosthesis, subsequent encounter: Secondary | ICD-10-CM
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
## 2020-05-25 NOTE — Assessment & Plan Note (Signed)
She has multiple reasons to have right hip pain.  I reviewed with her again the uncertainty about whether or not her enterococcal infection has been cured or is simply being suppressed by amoxicillin.  She prefers to continue taking it for now since she is having no problems tolerating it.  She will follow-up with me in late June.

## 2020-05-26 LAB — C-REACTIVE PROTEIN: CRP: 0.8 mg/L (ref <8.0)

## 2020-05-26 LAB — SEDIMENTATION RATE: Sed Rate: 6 mm/h (ref 0–30)

## 2020-08-03 ENCOUNTER — Ambulatory Visit: Payer: Medicare PPO | Admitting: Internal Medicine

## 2020-08-09 ENCOUNTER — Ambulatory Visit: Payer: Medicare PPO | Admitting: Internal Medicine

## 2020-08-24 ENCOUNTER — Ambulatory Visit: Payer: Medicare PPO | Admitting: Internal Medicine

## 2020-08-24 ENCOUNTER — Encounter: Payer: Self-pay | Admitting: Internal Medicine

## 2020-08-24 ENCOUNTER — Other Ambulatory Visit: Payer: Self-pay

## 2020-08-24 DIAGNOSIS — T8451XD Infection and inflammatory reaction due to internal right hip prosthesis, subsequent encounter: Secondary | ICD-10-CM | POA: Diagnosis not present

## 2020-08-24 MED ORDER — AMOXICILLIN 500 MG PO CAPS: 500.0000 mg | ORAL_CAPSULE | Freq: Two times a day (BID) | ORAL | 11 refills | Status: DC

## 2020-08-24 NOTE — Progress Notes (Signed)
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   CETIRIZINE (ZYRTEC) 10 MG TABLET    Take 10 mg by mouth daily.    CHOLECALCIFEROL (VITAMIN D) 50 MCG (2000 UT) TABLET    Take 2,000 Units by mouth daily.    DOCUSATE SODIUM (COLACE) 100 MG CAPSULE    Take 1 capsule (100 mg total) by mouth 2 (two) times daily.   DULOXETINE (CYMBALTA) 30 MG CAPSULE    Take 60 mg by mouth daily.    EPIPEN 2-PAK 0.3 MG/0.3ML SOAJ INJECTION    Inject 0.3 mg into the muscle daily as needed. For anaphylactic allergic reaction   EZETIMIBE (ZETIA) 10 MG TABLET    Take 10 mg by mouth daily.   FENOFIBRATE 54 MG TABLET    Take 54 mg by mouth daily.   FOLIC ACID (FOLVITE) 1 MG TABLET    Take 1 mg by mouth daily.   GABAPENTIN (NEURONTIN) 100 MG CAPSULE    Take 300 mg by mouth at bedtime.    GOLIMUMAB (SIMPONI ARIA) 50 MG/4ML SOLN INJECTION    Inject 50 mg into the vein every 8 (eight) weeks.   HYDROCODONE-ACETAMINOPHEN (NORCO) 7.5-325 MG  TABLET    Take 1-4 tablets by mouth every 6 (six) hours as needed for moderate pain.   HYDROXYZINE (ATARAX/VISTARIL) 25 MG TABLET    Take 25 mg by mouth daily.   LATANOPROST (XALATAN) 0.005 % OPHTHALMIC SOLUTION    Place 1 drop into both eyes at bedtime.    METHOTREXATE 250 MG/10ML INJECTION    methotrexate sodium 25 mg/mL injection solution   METHOTREXATE 50 MG/2ML INJECTION       NON FORMULARY    Inject 1 Syringe into the skin 2 (two) times a week. Allergy Shots   ONDANSETRON (ZOFRAN) 4 MG TABLET    Take 1 tablet (4 mg total) by mouth every 6 (six) hours as needed for nausea.   OXYCODONE (OXY IR/ROXICODONE) 5 MG IMMEDIATE RELEASE TABLET    Take 1 tablet (5 mg total) by mouth every 4 (four) hours as needed for moderate pain (pain score 4-6).   PIOGLITAZONE (ACTOS) 30 MG TABLET    Take 30 mg by mouth daily.   SUMATRIPTAN (IMITREX) 100 MG TABLET    Take 1 tablet (100 mg total) by mouth once as needed. May repeat in 2 hours if headache persists or recurs.   TOPIRAMATE (TOPAMAX) 50 MG TABLET  Take 1 tablet (50 mg total) by mouth at bedtime.  Modified Medications   Modified Medication Previous Medication   AMOXICILLIN (AMOXIL) 500 MG CAPSULE amoxicillin (AMOXIL) 500 MG capsule      Take 1 capsule (500 mg total) by mouth 2 (two) times daily.    Take 1 capsule (500 mg total) by mouth 2 (two) times daily.  Discontinued Medications   No medications on file    Subjective: [name] is in for her routine follow-up visit.  She has longstanding rheumatoid and degenerative arthritis. She had right total hip arthroplasty in December 2018 and developed severe right hip pain postoperatively.  An MRI in November 2020 showed inflammation and possible loosening around the femoral component.  She underwent revision arthroplasty on 07/30/2019.  No gross purulence was noted at the time of surgery but Enterococcus was isolated from femoral canal cultures.  She received 6 weeks of IV antibiotic therapy and has been on  chronic suppressive amoxicillin ever since.  She has not had any problems tolerating her amoxicillin.  She had a steroid injection in her right hip 1 month ago which did help with her chronic pain.  She is still struggling with diffuse pain in her hands and feet related to her rheumatoid arthritis.   Review of Systems: Review of Systems  Constitutional:  Negative for chills, diaphoresis, fever, malaise/fatigue and weight loss.  Respiratory:  Negative for cough, sputum production and shortness of breath.   Cardiovascular:  Negative for chest pain.  Gastrointestinal:  Negative for abdominal pain, constipation, diarrhea, nausea and vomiting.  Musculoskeletal:  Positive for back pain and joint pain.  Skin:  Negative for rash.  Neurological:  Positive for tingling and sensory change.   Past Medical History:  Diagnosis Date   Chronic fatigue    Depression    Diabetes mellitus without complication (HCC)    Early cataracts, bilateral    Fibromyalgia    Glaucoma    Headache    Vestibular migraines   History of bronchitis    History of pneumonia    Hypercholesterolemia    RA (rheumatoid arthritis) (HCC)    RA (rheumatoid arthritis) (HCC)    TIA (transient ischemic attack)    Vertigo    Wears glasses     Social History   Tobacco Use   Smoking status: Never   Smokeless tobacco: Never  Vaping Use   Vaping Use: Never used  Substance Use Topics   Alcohol use: No   Drug use: No    Family History  Problem Relation Age of Onset   Diabetes Mother    Stroke Mother    Heart disease Mother    High blood pressure Father    Stroke Father    Diabetes Father    Heart disease Father    Heart disease Sister     Allergies  Allergen Reactions   Sulfa Antibiotics Other (See Comments)    Don't remember     Objective: Vitals:   08/24/20 1106  BP: 128/78  Pulse: (!) 59  Temp: 98.1 F (36.7 C)  TempSrc: Oral  Weight: 181 lb (82.1 kg)   Body mass index is 35.35  kg/m.  Physical Exam Constitutional:      Comments: She is very pleasant as usual and in good spirits.  Musculoskeletal:        General: Tenderness present.  Skin:    Findings: No rash.  Psychiatric:        Mood and Affect: Mood normal.  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  I reviewed again with her the uncertainty as to whether or not her flexion is cured or simply fully suppressed on amoxicillin therapy.  As before, she prefers to stay on chronic suppressive amoxicillin as long as she is tolerating it.  She will follow-up in 6 months.       Relevant Medications   amoxicillin (AMOXIL) 500 MG capsule     Cliffton Asters, MD [facility] [phone] pager   [phone] cell 08/24/2020, 11:34 AM

## 2020-08-24 NOTE — Assessment & Plan Note (Signed)
She has no signs of active right hip infection and her last 3 inflammatory markers have been normal.  I reviewed again with her the uncertainty as to whether or not her flexion is cured or simply fully suppressed on amoxicillin therapy.  As before, she prefers to stay on chronic suppressive amoxicillin as long as she is tolerating it.  She will follow-up in 6 months.

## 2021-01-11 ENCOUNTER — Other Ambulatory Visit: Payer: Self-pay | Admitting: Family Medicine

## 2021-01-11 DIAGNOSIS — M8588 Other specified disorders of bone density and structure, other site: Secondary | ICD-10-CM

## 2021-02-28 ENCOUNTER — Ambulatory Visit: Payer: Medicare PPO | Admitting: Internal Medicine

## 2021-02-28 ENCOUNTER — Encounter: Payer: Self-pay | Admitting: Internal Medicine

## 2021-02-28 ENCOUNTER — Other Ambulatory Visit: Payer: Self-pay

## 2021-02-28 DIAGNOSIS — T8451XD Infection and inflammatory reaction due to internal right hip prosthesis, subsequent encounter: Secondary | ICD-10-CM

## 2021-02-28 NOTE — Progress Notes (Signed)
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   AMOXICILLIN (AMOXIL) 500 MG CAPSULE    Take 1 capsule (500 mg total) by mouth 2 (two) times daily.   CETIRIZINE (ZYRTEC) 10 MG TABLET    Take 10 mg by mouth daily.    CHOLECALCIFEROL (VITAMIN D) 50 MCG (2000 UT) TABLET    Take 2,000 Units by mouth daily.    DOCUSATE SODIUM (COLACE) 100 MG CAPSULE    Take 1 capsule (100 mg total) by mouth 2 (two) times daily.   DULOXETINE (CYMBALTA) 30 MG CAPSULE    Take 60 mg by mouth daily.    EPIPEN 2-PAK 0.3 MG/0.3ML SOAJ INJECTION    Inject 0.3 mg into the muscle daily as needed. For anaphylactic allergic reaction   EZETIMIBE (ZETIA) 10 MG TABLET    Take 10 mg by mouth daily.   FENOFIBRATE 54 MG TABLET    Take 54 mg by mouth daily.   FOLIC ACID (FOLVITE) 1 MG TABLET    Take 1 mg by mouth daily.   GABAPENTIN (NEURONTIN) 100 MG CAPSULE    Take 300 mg by mouth at bedtime.    GOLIMUMAB (SIMPONI ARIA) 50 MG/4ML SOLN INJECTION     Inject 50 mg into the vein every 8 (eight) weeks.   HYDROCODONE-ACETAMINOPHEN (NORCO) 7.5-325 MG TABLET    Take 1-4 tablets by mouth every 6 (six) hours as needed for moderate pain.   HYDROXYZINE (ATARAX/VISTARIL) 25 MG TABLET    Take 25 mg by mouth daily.   LATANOPROST (XALATAN) 0.005 % OPHTHALMIC SOLUTION    Place 1 drop into both eyes at bedtime.    METHOTREXATE 250 MG/10ML INJECTION    methotrexate sodium 25 mg/mL injection solution   METHOTREXATE 50 MG/2ML INJECTION       NON FORMULARY    Inject 1 Syringe into the skin 2 (two) times a week. Allergy Shots   ONDANSETRON (ZOFRAN) 4 MG TABLET    Take 1 tablet (4 mg total) by mouth every 6 (six) hours as needed for nausea.   OXYCODONE (OXY IR/ROXICODONE) 5 MG IMMEDIATE RELEASE TABLET    Take 1 tablet (5 mg total) by mouth every 4 (four) hours as needed for moderate pain (pain score 4-6).   PIOGLITAZONE (ACTOS) 30 MG TABLET    Take 30 mg by mouth daily.   SUMATRIPTAN (IMITREX) 100 MG TABLET    Take 1 tablet (100 mg total)  by mouth once as needed. May repeat in 2 hours if headache persists or recurs.   TOCILIZUMAB (ACTEMRA) 200 MG/10ML SOLN       TOPIRAMATE (TOPAMAX) 50 MG TABLET    Take 1 tablet (50 mg total) by mouth at bedtime.  Modified Medications   No medications on file  Discontinued Medications   No medications on file    Subjective: [name] is in for her routine follow-up visit.  She has longstanding rheumatoid and degenerative arthritis. She had right total hip arthroplasty in December 2018 and developed severe right hip pain postoperatively.  An MRI in November 2020 showed inflammation and possible loosening around the femoral component.  She underwent revision arthroplasty on 07/30/2019.  No gross purulence was noted at the time of surgery but Enterococcus was isolated from femoral canal cultures.  She received 6 weeks of IV antibiotic therapy and has been on chronic suppressive amoxicillin ever since.  She has not had any problems  tolerating her amoxicillin.   Review of Systems: Review of Systems  Constitutional:  Negative for chills, diaphoresis, fever, malaise/fatigue and weight loss.  Respiratory:  Negative for cough, sputum production and shortness of breath.   Cardiovascular:  Negative for chest pain.  Gastrointestinal:  Negative for abdominal pain, constipation, diarrhea, nausea and vomiting.  Musculoskeletal:  Positive for back pain and joint pain.  Skin:  Negative for rash.   Past Medical History:  Diagnosis Date   Chronic fatigue    Depression    Diabetes mellitus without complication (HCC)    Early cataracts, bilateral    Fibromyalgia    Glaucoma    Headache    Vestibular migraines   History of bronchitis    History of pneumonia    Hypercholesterolemia    RA (rheumatoid arthritis) (HCC)    RA (rheumatoid arthritis) (HCC)    TIA (transient ischemic attack)    Vertigo    Wears glasses     Social History   Tobacco Use   Smoking status: Never   Smokeless tobacco: Never  Vaping Use   Vaping Use: Never used  Substance Use Topics   Alcohol use: No   Drug use: No    Family History  Problem Relation Age of Onset   Diabetes Mother    Stroke Mother    Heart disease Mother    High blood pressure Father    Stroke Father    Diabetes Father    Heart disease Father    Heart disease Sister     Allergies  Allergen Reactions   Sulfa Antibiotics Other (See Comments)    Don't remember     Objective: Vitals:   02/28/21 1107  BP: 139/80  Pulse: (!) 58  Temp: (!) 97.4 F (36.3 C)  TempSrc: Temporal  SpO2: 98%  Weight: 190 lb (86.2 kg)   Body mass index is 37.11 kg/m.  Physical Exam Constitutional:      Comments: She is in good spirits.  Cardiovascular:     Rate and Rhythm: Normal rate.  Pulmonary:     Effort: Pulmonary effort is normal.  Musculoskeletal:        General: Tenderness present.  Skin:    Findings: No rash.  Psychiatric:        [name] and Affect: [name] normal.     Lab Results Sed Rate (mm/h)  Date Value  05/25/2020 6  01/12/2020 14  09/08/2019 29   CRP (mg/L)  Date Value  05/25/2020 0.8  01/12/2020 0.9  09/08/2019 2.8  Problem List Items Addressed This Visit       High   Infection of right prosthetic hip joint ([place])    I talked to [place] again today about the relative pros and cons of continuing chronic suppressive amoxicillin.  I told her that upon review of Dr. Sid Falcon operative note from June 2021 he did not seem to find any evidence of active infection even though operative cultures grew Enterococcus.  I also pointed out to her that her inflammatory markers have normalized since the time of surgery.  I think that there is a good chance that any infection has been cured but the only way to be certain would be to have her stop amoxicillin and wait and see what happens.  I also reminded her that the major risk of chronic antibiotic therapy is C. difficile colitis which can be severe and difficult to cure.  She says that she will be meeting with Dr. Lyla Glassing in a few weeks and will talk to him about whether or not she should stay on chronic amoxicillin.  She will follow-up here in 6 months.       Michel Bickers, MD [facility] [place] [facility] [phone] pager   [phone] cell 02/28/2021, 11:40 AM

## 2021-02-28 NOTE — Assessment & Plan Note (Signed)
I talked to [name] again today about the relative pros and cons of continuing chronic suppressive amoxicillin.  I told her that upon review of Dr. Sid Falcon operative note from June 2021 he did not seem to find any evidence of active infection even though operative cultures grew Enterococcus.  I also pointed out to her that her inflammatory markers have normalized since the time of surgery.  I think that there is a good chance that any infection has been cured but the only way to be certain would be to have her stop amoxicillin and wait and see what happens.  I also reminded her that the major risk of chronic antibiotic therapy is C. difficile colitis which can be severe and difficult to cure.  She says that she will be meeting with Dr. Lyla Glassing in a few weeks and will talk to him about whether or not she should stay on chronic amoxicillin.  She will follow-up here in 6 months.

## 2021-04-05 ENCOUNTER — Other Ambulatory Visit: Payer: Medicare PPO

## 2021-04-05 ENCOUNTER — Other Ambulatory Visit: Payer: Self-pay | Admitting: Family Medicine

## 2021-04-05 ENCOUNTER — Ambulatory Visit
Admission: RE | Admit: 2021-04-05 | Discharge: 2021-04-05 | Disposition: A | Discharge status: Home / Self Care | Payer: Medicare PPO | Source: Ambulatory Visit | Attending: Family Medicine | Admitting: Family Medicine

## 2021-04-05 DIAGNOSIS — R0602 Shortness of breath: Secondary | ICD-10-CM

## 2021-04-05 MED ORDER — IOPAMIDOL (ISOVUE-370) INJECTION 76%
75.0000 mL | Freq: Once | INTRAVENOUS | Status: AC | PRN
  Administered 2021-04-05: 75 mL via INTRAVENOUS

## 2021-04-12 IMAGING — RF DG HIP (WITH PELVIS) OPERATIVE*R*
1 series · 4 of 4 positions shown · non-contrast
Comparison: None.

CLINICAL DATA: Right hip revision.

EXAM:
DG C-ARM 1-60 MIN; OPERATIVE RIGHT HIP WITH PELVIS
FLUOROSCOPY TIME:  Fluoroscopy Time:  22 seconds
Radiation Exposure Index (if provided by the fluoroscopic device):
4.96 mGy
Number of Acquired Spot Images: 4

[Series 1: run · 4 of 4 slices shown]
[im 1/4]
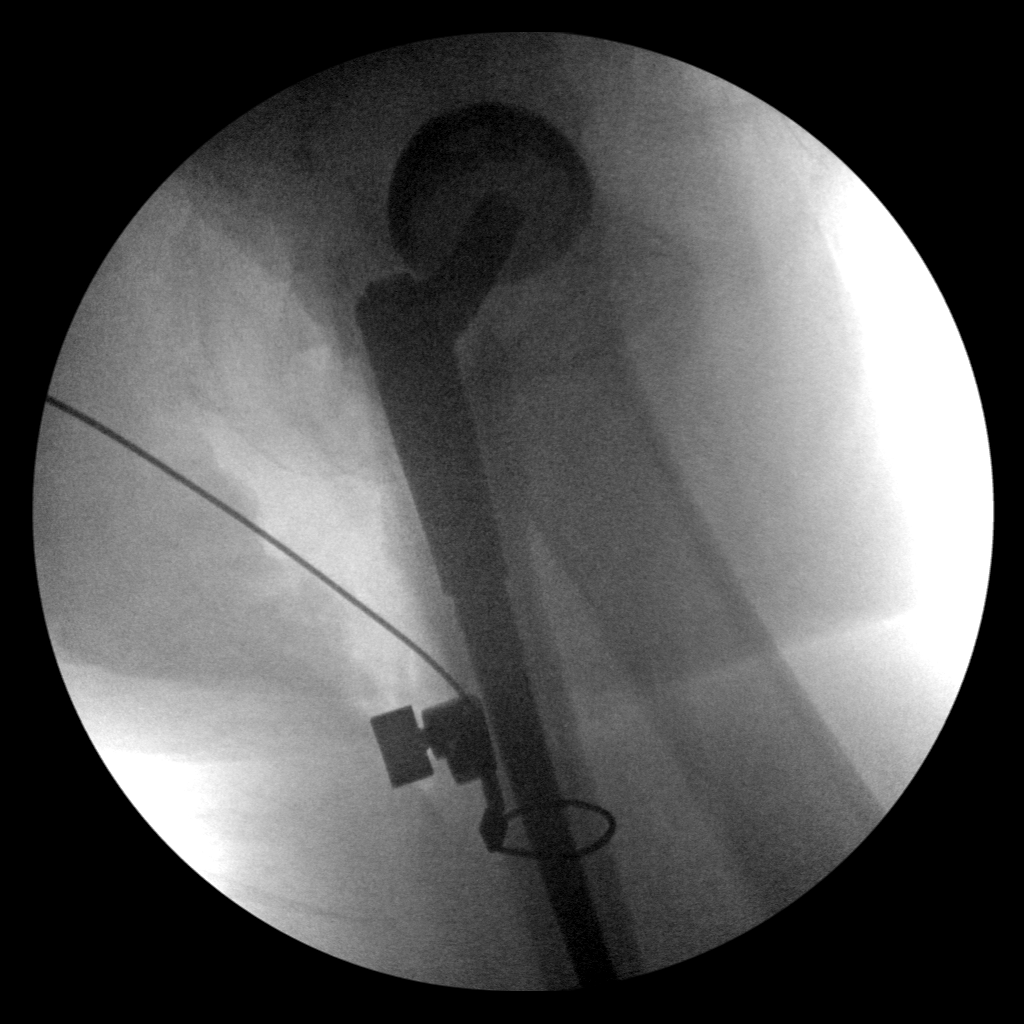
[im 2/4]
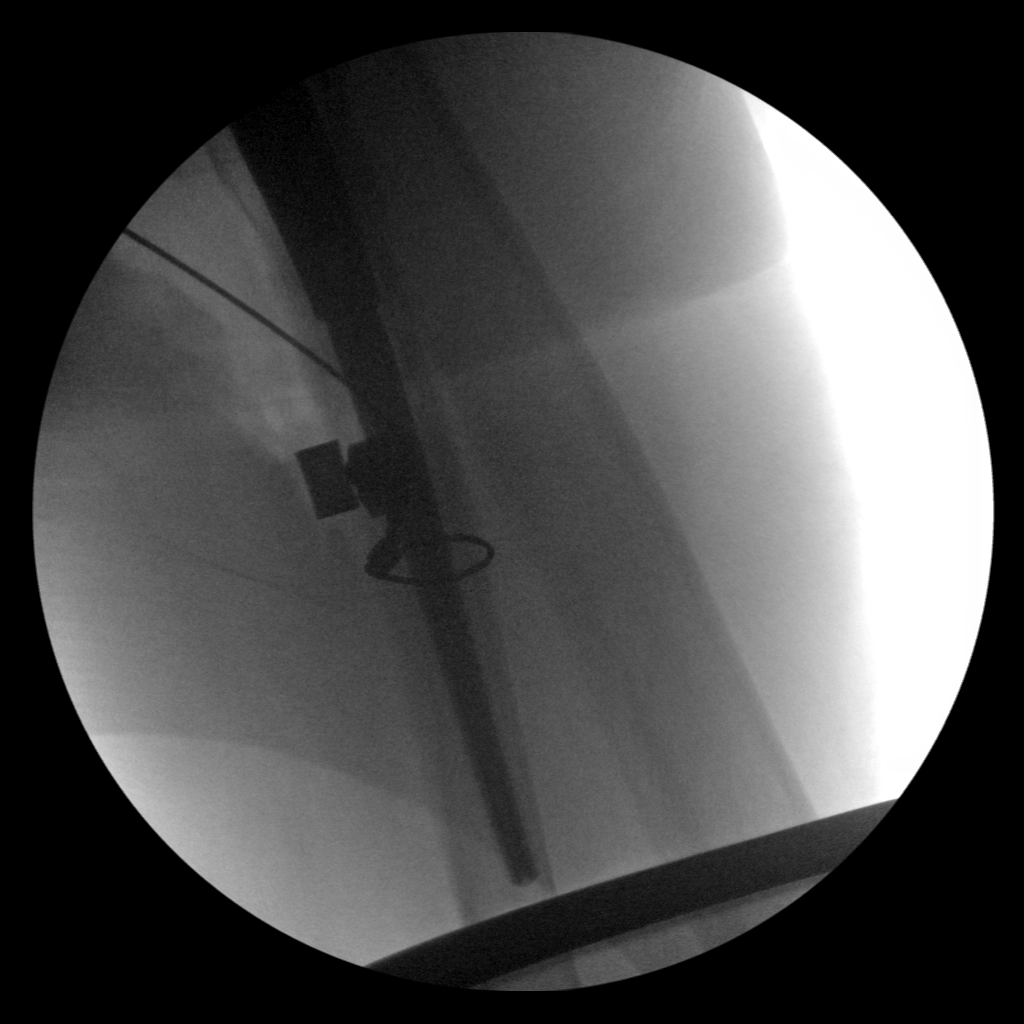
[im 3/4]
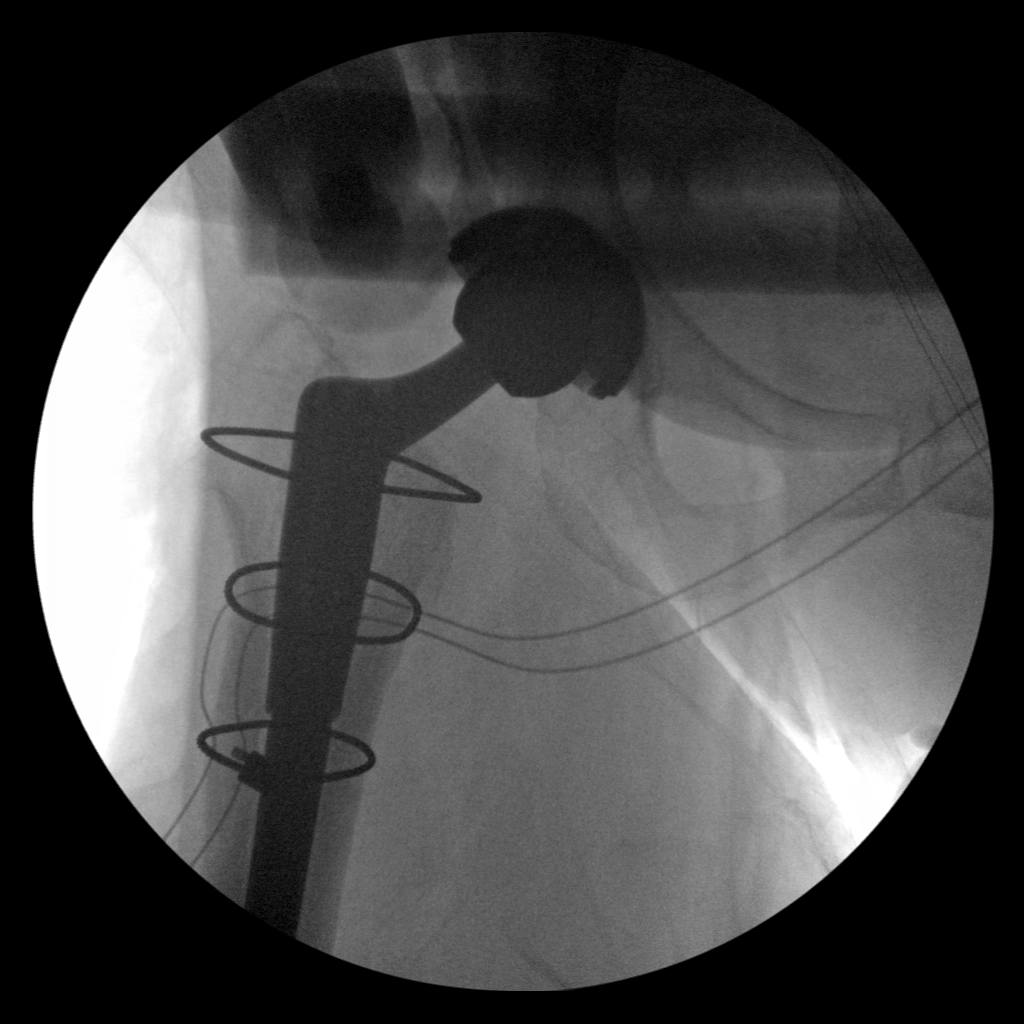
[im 4/4]
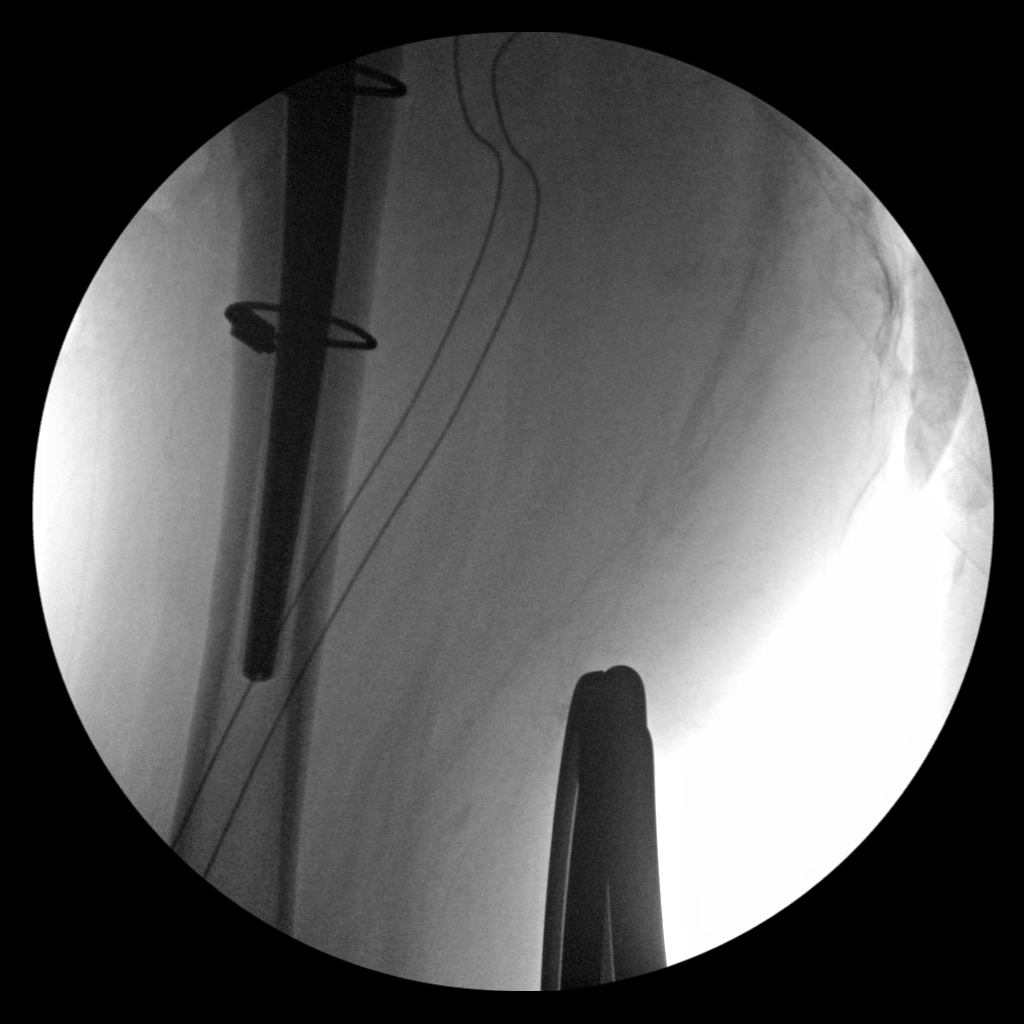

[4 of 4 positions shown; findings below may reference images not displayed]

FINDINGS: Four fluoroscopic spot views in the operating room. Right hip
arthroplasty with proximal cerclage wire fixation. Total fluoroscopy
time 22 seconds.
IMPRESSION: Fluoroscopic spot views during right hip arthroplasty revision.

## 2021-04-12 IMAGING — DX DG PORTABLE PELVIS
2 series · 2 of 2 positions shown · non-contrast
Comparison: Preoperative radiograph 12/17/2018

CLINICAL DATA: Postop.

EXAM:
PORTABLE PELVIS 1-2 VIEWS

[pelvis ap (1 of 2)]
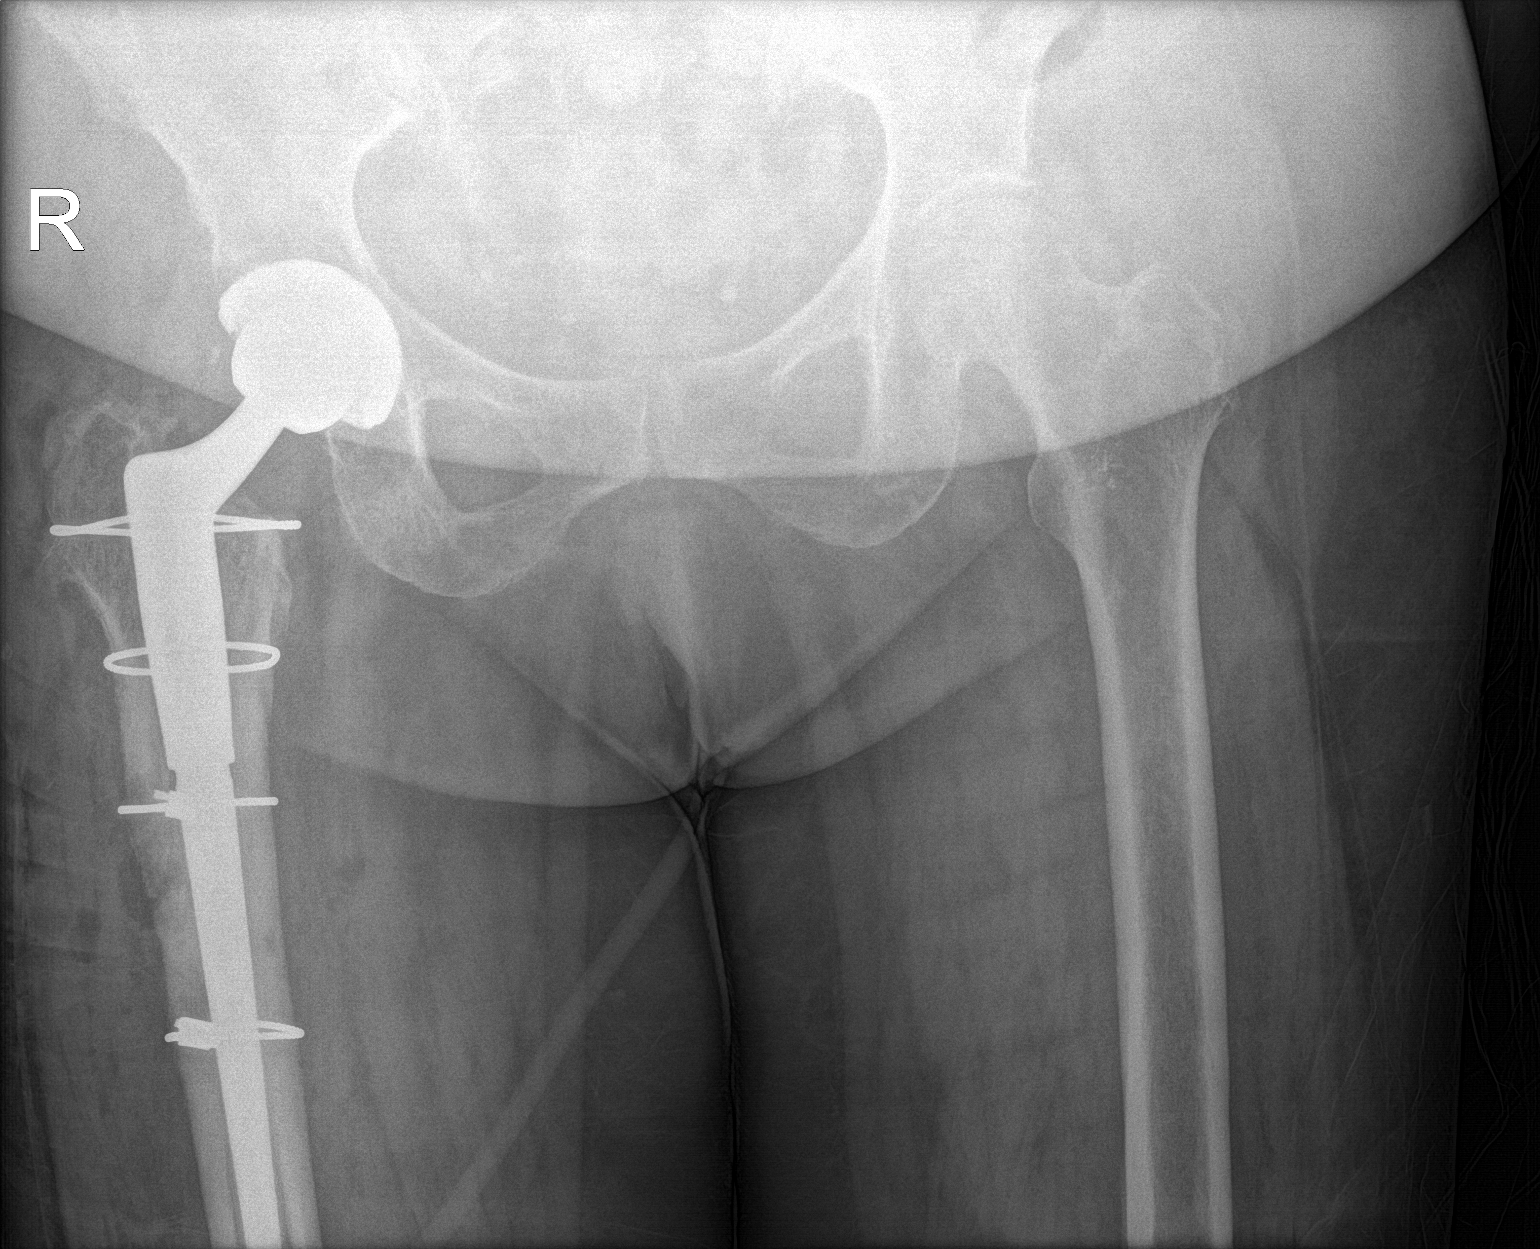

[pelvis ap (2 of 2)]
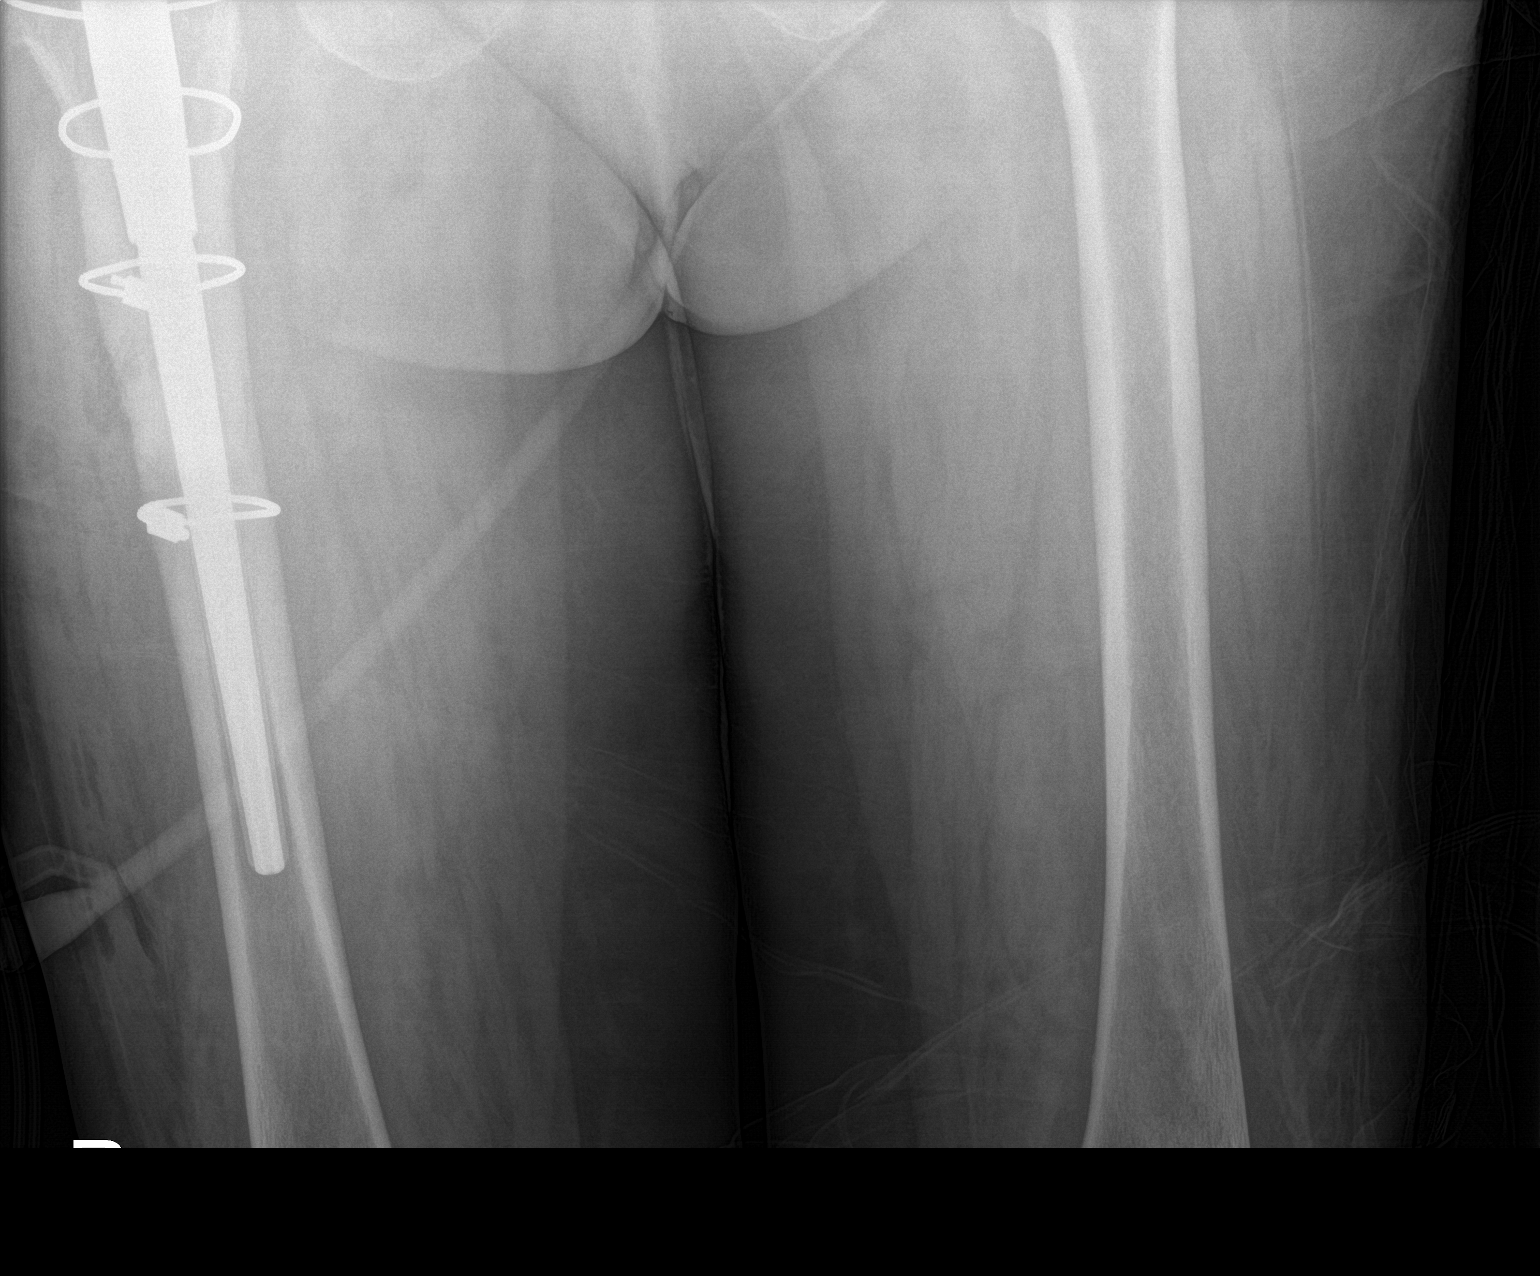

[2 of 2 positions shown; findings below may reference images not displayed]

FINDINGS: Revision right hip arthroplasty with cerclage wire fixation. No
periprosthetic lucency or fracture. Recent postsurgical change
includes air and edema in the soft tissues.
IMPRESSION: Revision right hip arthroplasty without immediate postoperative
complication.

## 2021-04-12 IMAGING — RF DG C-ARM 1-60 MIN
1 series · 4 of 4 positions shown · non-contrast
Comparison: None.

CLINICAL DATA: Right hip revision.

EXAM:
DG C-ARM 1-60 MIN; OPERATIVE RIGHT HIP WITH PELVIS
FLUOROSCOPY TIME:  Fluoroscopy Time:  22 seconds
Radiation Exposure Index (if provided by the fluoroscopic device):
4.96 mGy
Number of Acquired Spot Images: 4

[Series 1: run · 4 of 4 slices shown]
[im 1/4]
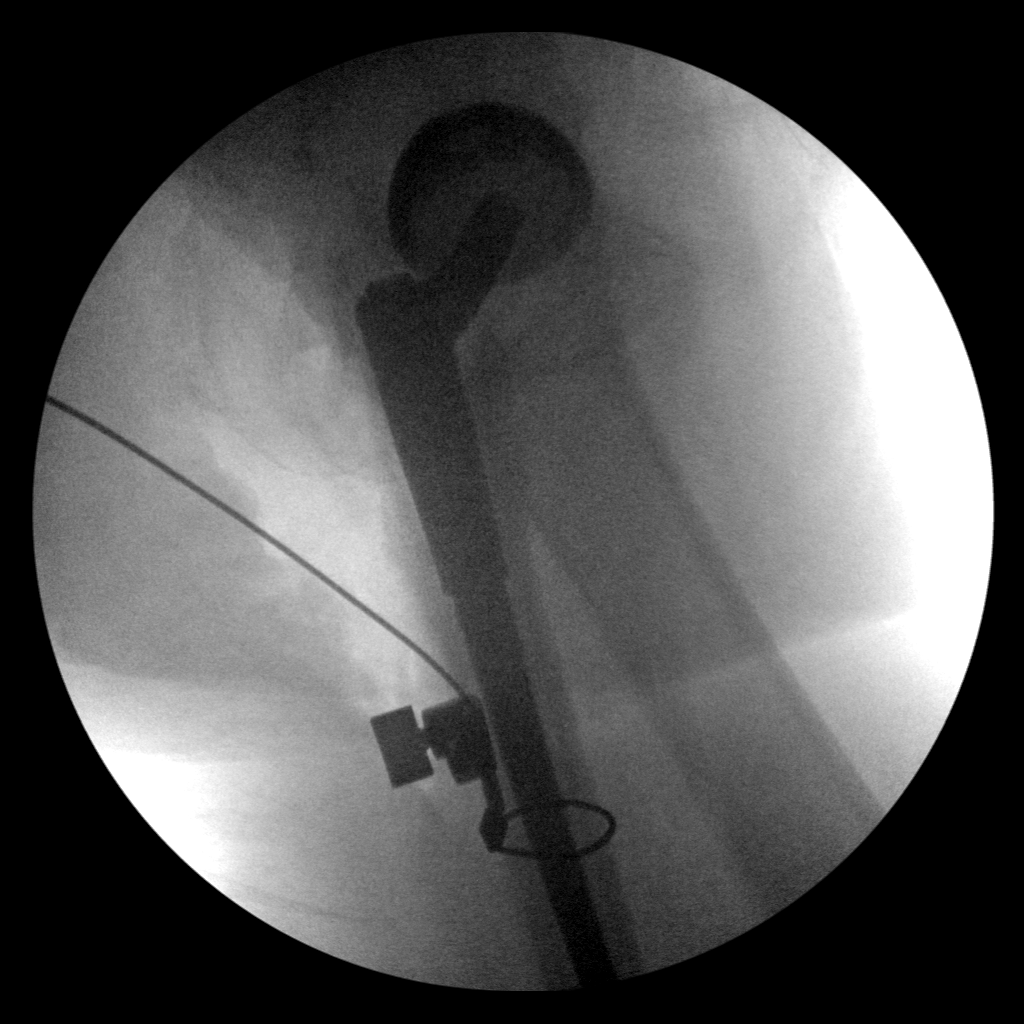
[im 2/4]
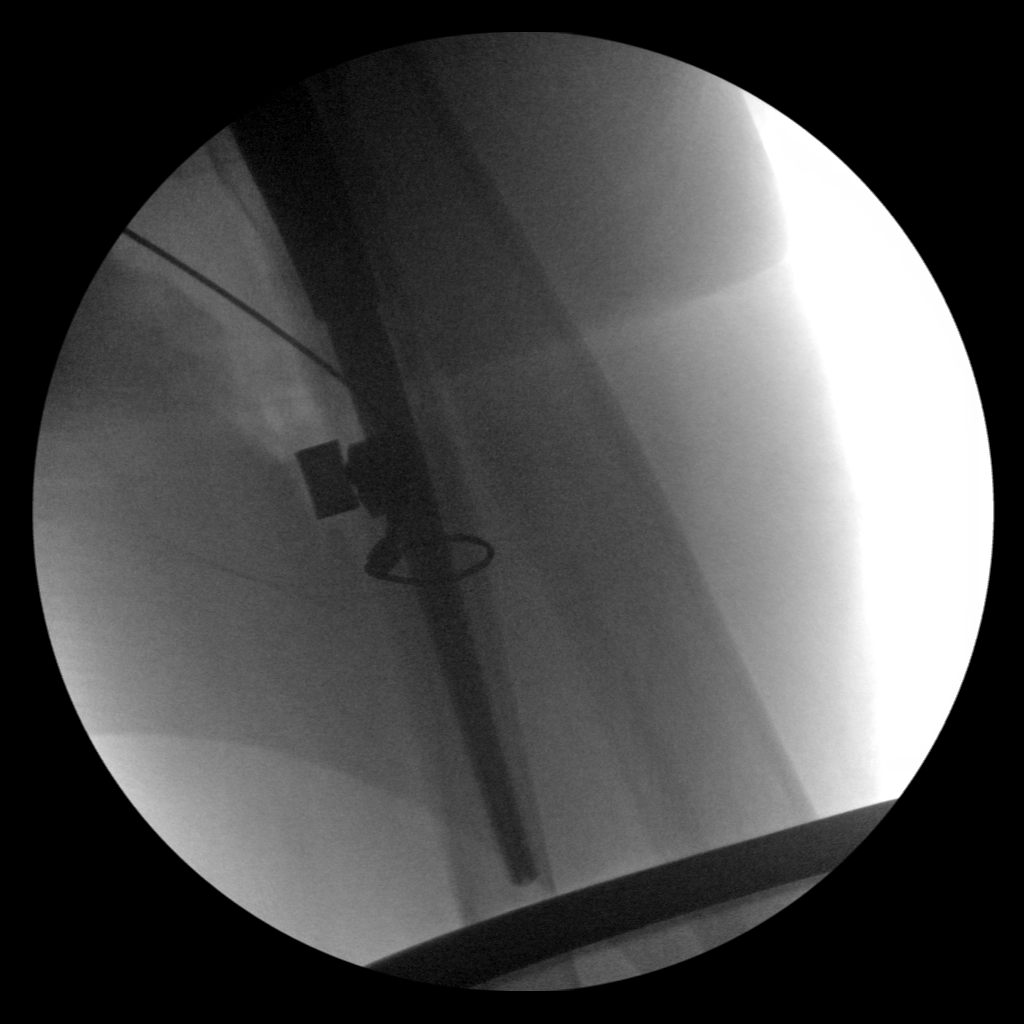
[im 3/4]
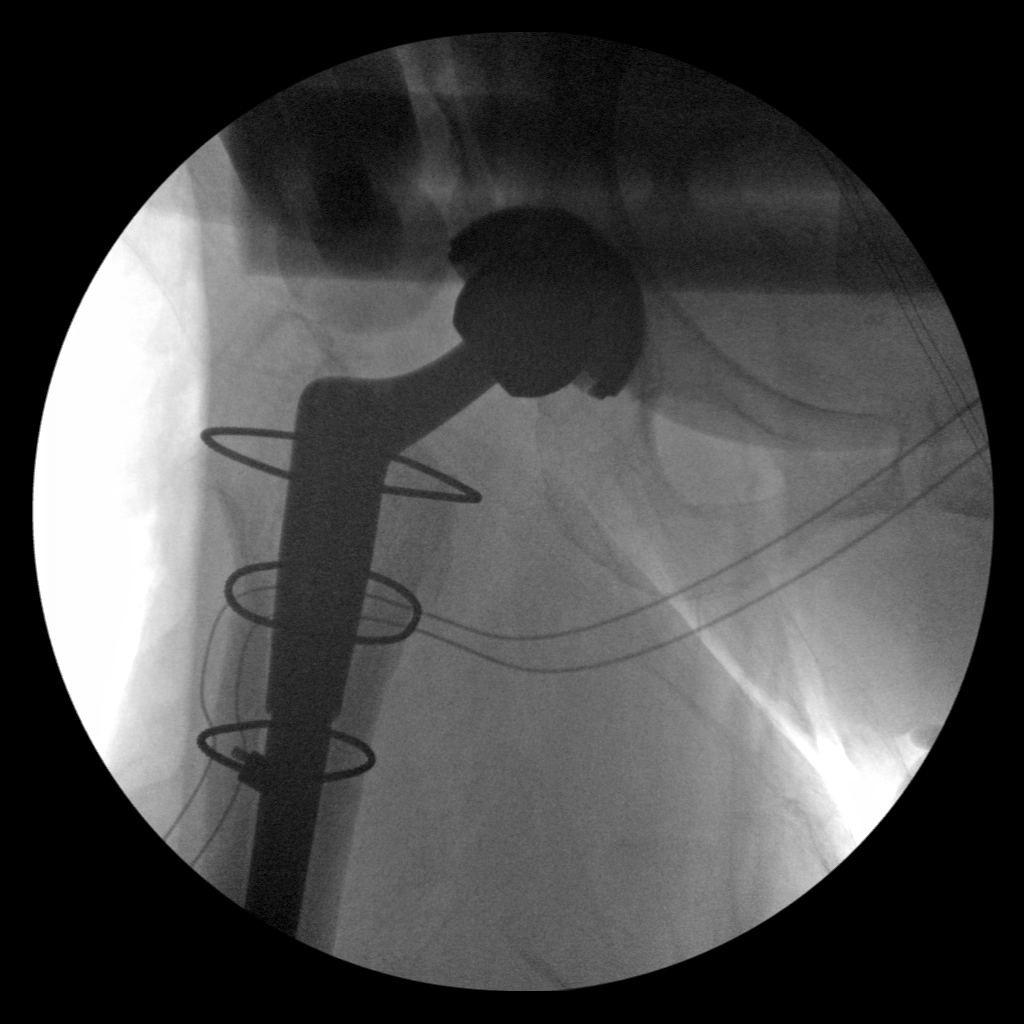
[im 4/4]
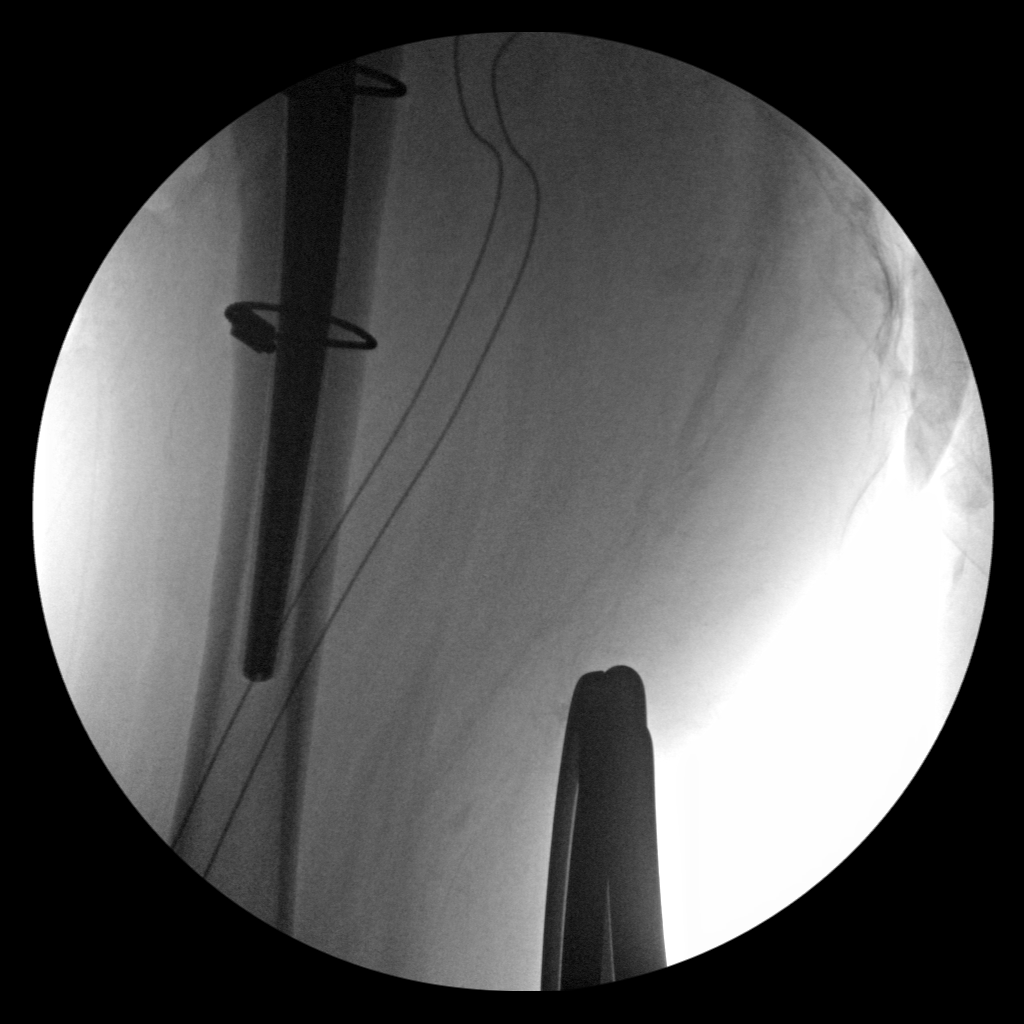

[4 of 4 positions shown; findings below may reference images not displayed]

FINDINGS: Four fluoroscopic spot views in the operating room. Right hip
arthroplasty with proximal cerclage wire fixation. Total fluoroscopy
time 22 seconds.
IMPRESSION: Fluoroscopic spot views during right hip arthroplasty revision.

## 2021-04-22 NOTE — Progress Notes (Signed)
?Cardiology Office Note:   ? ?Date:  04/25/2021  ? ?ID:  [name], DOB 1945-02-23, MRN [id number] ? ?PCP:  Clayborn Heron, MD  ?Cardiologist:  None  ? ?Referring MD: Clayborn Heron, MD  ? ?Chief Complaint  ?Patient presents with  ? Shortness of Breath  ? Congestive Heart Failure  ? ? ?History of Present Illness:   ? ?[name] is a 76 y.o. female with a hx of new systolic ejection murmur and DOE, asymptomatic/undiagnosed coronary artery disease based on CT coronary artery and aortic atherosclerosis.  Other problems are DM II, TIA, estrogen deficiency, rheumatoid arthritis, fibromyalgia, spinal stenosis, statin intolerance, and hyperlipidemia. ? ?Dyspnea on exertion led to referral.  Is been present and worsening over the last 6 months.  She is not able to lie flat.  That is been present for quite some time.  She experiences no exertional related chest discomfort.  She has occasional shocklike discomforts in the right upper chest that lasts seconds and go away.  This may occur once or twice every month.  It is not exertionally related. ? ?She does noted some lower extremity edema, left greater than right.  There is no prior history of vascular disease.  She has coronary atherosclerosis noted on CT along with aortic atherosclerosis. ? ?She is diabetic, there is a family history of CAD (mother), father has CVA and his sister has a pacemaker. ? ?Past Medical History:  ?Diagnosis Date  ? Chronic fatigue   ? Depression   ? Diabetes mellitus without complication (HCC)   ? Early cataracts, bilateral   ? Fibromyalgia   ? Glaucoma   ? Headache   ? Vestibular migraines  ? History of bronchitis   ? History of pneumonia   ? Hypercholesterolemia   ? RA (rheumatoid arthritis) (HCC)   ? RA (rheumatoid arthritis) (HCC)   ? TIA (transient ischemic attack)   ? Vertigo   ? Wears glasses   ? ? ?Past Surgical History:  ?Procedure Laterality Date  ? APPENDECTOMY    ? BACK SURGERY  2012, 2017  ? BILATERAL  CARPAL TUNNEL RELEASE    ? CESAREAN SECTION    ? x3  ? DILATION AND CURETTAGE OF UTERUS    ? GLAUCOMA SURGERY    ? KNEE ARTHROSCOPY Left   ? MOUTH SURGERY    ? TONSILLECTOMY    ? TOTAL HIP ARTHROPLASTY Right 01/30/2017  ? Procedure: RIGHT TOTAL HIP ARTHROPLASTY ANTERIOR APPROACH;  Surgeon: Kathryne Hitch, MD;  Location: [facility];  Service: Orthopedics;  Laterality: Right;  ? TOTAL HIP REVISION Right 07/28/2019  ? Procedure: TOTAL HIP REVISION, posterior approach femoal comp;  Surgeon: Samson Frederic, MD;  Location: [facility];  Service: Orthopedics;  Laterality: Right;  ? ? ?Current Medications: ?Current Meds  ?Medication Sig  ? acetaminophen (TYLENOL) 325 MG tablet Take 2 tablets (650 mg total) by mouth every 6 (six) hours as needed for mild pain (pain score 1-3 or temp > 100.5).  ? amoxicillin (AMOXIL) 500 MG capsule Take 1 capsule (500 mg total) by mouth 2 (two) times daily.  ? cetirizine (ZYRTEC) 10 MG tablet Take 10 mg by mouth daily.   ? Cholecalciferol (VITAMIN D) 50 MCG (2000 UT) tablet Take 2,000 Units by mouth daily.   ? docusate sodium (COLACE) 100 MG capsule Take 1 capsule (100 mg total) by mouth 2 (two) times daily.  ? DULoxetine (CYMBALTA) 30 MG capsule Take 60 mg by mouth daily.   ? EPIPEN  2-PAK 0.3 MG/0.3ML SOAJ injection Inject 0.3 mg into the muscle daily as needed. For anaphylactic allergic reaction  ? ezetimibe (ZETIA) 10 MG tablet Take 10 mg by mouth daily.  ? fenofibrate 54 MG tablet Take 54 mg by mouth daily.  ? folic acid (FOLVITE) 1 MG tablet Take 1 mg by mouth daily.  ? gabapentin (NEURONTIN) 100 MG capsule Take 300 mg by mouth at bedtime.   ? HYDROcodone-acetaminophen (NORCO) 7.5-325 MG tablet Take 1-4 tablets by mouth every 6 (six) hours as needed for moderate pain.  ? hydrOXYzine (ATARAX/VISTARIL) 25 MG tablet Take 25 mg by mouth daily.  ? latanoprost (XALATAN) 0.005 % ophthalmic solution Place 1 drop into both eyes at bedtime.   ? methotrexate 250 MG/10ML injection methotrexate sodium  25 mg/mL injection solution  ? methotrexate 50 MG/2ML injection   ? NON FORMULARY Inject 1 Syringe into the skin 2 (two) times a week. Allergy Shots  ? oxyCODONE (OXY IR/ROXICODONE) 5 MG immediate release tablet Take 1 tablet (5 mg total) by mouth every 4 (four) hours as needed for moderate pain (pain score 4-6).  ? rosuvastatin (CRESTOR) 5 MG tablet Take 1 tablet (5 mg total) by mouth every Monday, Wednesday, and Friday.  ? SUMAtriptan (IMITREX) 100 MG tablet Take 1 tablet (100 mg total) by mouth once as needed. May repeat in 2 hours if headache persists or recurs.  ? Tocilizumab (ACTEMRA) 200 MG/10ML SOLN   ? topiramate (TOPAMAX) 50 MG tablet Take 1 tablet (50 mg total) by mouth at bedtime.  ?  ? ?Allergies:   Sulfa antibiotics  ? ?Social History  ? ?Socioeconomic History  ? Marital status: Widowed  ?  Spouse name: Not on file  ? Number of children: 4  ? Years of education: 77  ? Highest education level: Not on file  ?Occupational History  ? Occupation: Retired  ?  Comment: Teacher  ?Tobacco Use  ? Smoking status: Never  ? Smokeless tobacco: Never  ?Vaping Use  ? Vaping Use: Never used  ?Substance and Sexual Activity  ? Alcohol use: No  ? Drug use: No  ? Sexual activity: Not on file  ?Other Topics Concern  ? Not on file  ?Social History Narrative  ? Lives at home alone  ? Right-handed  ? Caffeine: tea daily  ? Has 10 grandchildren  ? ?Social Determinants of Health  ? ?Financial Resource Strain: Not on file  ?Food Insecurity: Not on file  ?Transportation Needs: Not on file  ?Physical Activity: Not on file  ?Stress: Not on file  ?Social Connections: Not on file  ?  ? ?Family History: ?The patient's family history includes Diabetes in her father and mother; Heart disease in her father, mother, and sister; High blood pressure in her father; Stroke in her father and mother. ? ?ROS:   ?Please see the history of present illness.    ?Terrible orthopedic issues.  Status right hip replacement with chronic infection leading  to repeat replacement.  Other issues include foot pain, left knee discomfort, and arthritis.  Known to have an elevated D-dimer.  No history of murmur.  All other systems reviewed and are negative. ? ?EKGs/Labs/Other Studies Reviewed:   ? ?The following studies were reviewed today: ? ?Recent CTA CHEST 04/05/2021: ?IMPRESSION: ?1. Negative examination for pulmonary embolism. ?2. Mild, diffusely mosaic attenuation of the airspaces, suggesting ?small airways disease. ?3. Cardiomegaly and coronary artery disease. ?Aortic Atherosclerosis (ICD10-I70.0). ? ? ?EKG:  EKG 09/16/2019 with NSR and LAFB. ? ?Recent  Labs: ?No results found for requested labs within last 8760 hours.  ?Recent Lipid Panel ?   ?Component Value Date/Time  ? CHOL (H) 02/25/2007 0520  ?  217        ?ATP III CLASSIFICATION: ? <200     mg/dL   Desirable ? 628-366  mg/dL   Borderline High ? >=294    mg/dL   High  ? TRIG 144 02/25/2007 0520  ? HDL 33 (L) 02/25/2007 0520  ? CHOLHDL 6.6 02/25/2007 0520  ? VLDL 29 02/25/2007 0520  ? LDLCALC (H) 02/25/2007 0520  ?  155        ?Total Cholesterol/HDL:CHD Risk ?Coronary Heart Disease Risk Table ?                    Men   Women ? 1/2 Average Risk   3.4   3.3  ? ? ?Physical Exam:   ? ?VS:  BP 128/62   Pulse (!) 59   Ht 4\' 11"  (1.499 m)   Wt 194 lb 6.4 oz (88.2 kg)   SpO2 98%   BMI 39.26 kg/m?    ? ?Wt Readings from Last 3 Encounters:  ?04/25/21 194 lb 6.4 oz (88.2 kg)  ?02/28/21 190 lb (86.2 kg)  ?08/24/20 181 lb (82.1 kg)  ?  ? ?GEN: Morbidly obese. No acute distress ?HEENT: Normal ?NECK: No JVD. ?LYMPHATICS: No lymphadenopathy ?CARDIAC: 1/6 diamond-shaped right upper sternal systolic and left midsternal systolic murmur. RRR no gallop, or edema. ?VASCULAR:  Normal Pulses. No bruits. ?RESPIRATORY:  Clear to auscultation without rales, wheezing or rhonchi  ?ABDOMEN: Soft, non-tender, non-distended, No pulsatile mass, ?MUSCULOSKELETAL: No deformity  ?SKIN: Warm and dry ?NEUROLOGIC:  Alert and oriented x  3 ?PSYCHIATRIC:  Normal affect  ? ?ASSESSMENT:   ? ?1. DOE (dyspnea on exertion)   ?2. Coronary artery disease involving native coronary artery of native heart without angina pectoris   ?3. Systolic murmur   ?4. Aortic

## 2021-04-25 ENCOUNTER — Encounter: Payer: Self-pay | Admitting: Interventional Cardiology

## 2021-04-25 ENCOUNTER — Ambulatory Visit: Payer: Medicare PPO | Admitting: Interventional Cardiology

## 2021-04-25 ENCOUNTER — Other Ambulatory Visit: Payer: Self-pay

## 2021-04-25 VITALS — BP 128/62 | HR 59 | Ht 59.0 in | Wt 194.4 lb

## 2021-04-25 DIAGNOSIS — R0609 Other forms of dyspnea: Secondary | ICD-10-CM

## 2021-04-25 DIAGNOSIS — I700 Atherosclerosis of aorta: Secondary | ICD-10-CM | POA: Diagnosis not present

## 2021-04-25 DIAGNOSIS — Z86718 Personal history of other venous thrombosis and embolism: Secondary | ICD-10-CM

## 2021-04-25 DIAGNOSIS — I2510 Atherosclerotic heart disease of native coronary artery without angina pectoris: Secondary | ICD-10-CM | POA: Diagnosis not present

## 2021-04-25 DIAGNOSIS — R778 Other specified abnormalities of plasma proteins: Secondary | ICD-10-CM

## 2021-04-25 DIAGNOSIS — I444 Left anterior fascicular block: Secondary | ICD-10-CM

## 2021-04-25 DIAGNOSIS — R011 Cardiac murmur, unspecified: Secondary | ICD-10-CM | POA: Diagnosis not present

## 2021-04-25 MED ORDER — ROSUVASTATIN CALCIUM 5 MG PO TABS: 5.0000 mg | ORAL_TABLET | ORAL | 3 refills | Status: DC

## 2021-04-25 NOTE — Patient Instructions (Signed)
Medication Instructions:  ?1) START Rosuvastatin 5mg  once daily on Monday, Wednesday and Friday ? ?*If you need a refill on your cardiac medications before your next appointment, please call your pharmacy* ? ? ?Lab Work: ?Lipid and Liver in 6-8 weeks.  You will need to be fasting for these labs (nothing to eat or drink after midnight except water and black coffee). ? ?If you have labs (blood work) drawn today and your tests are completely normal, you will receive your results only by: ?MyChart Message (if you have MyChart) OR ?A paper copy in the mail ?If you have any lab test that is abnormal or we need to change your treatment, we will call you to review the results. ? ? ?Testing/Procedures: ?Your physician has requested that you have an echocardiogram. Echocardiography is a painless test that uses sound waves to create images of your heart. It provides your doctor with information about the size and shape of your heart and how well your heart?s chambers and valves are working. This procedure takes approximately one hour. There are no restrictions for this procedure. ? ? ?Follow-Up: ?[facility], you and your health needs are our priority.  As part of our continuing mission to provide you with exceptional heart care, we have created designated Provider Care Teams.  These Care Teams include your primary Cardiologist (physician) and Advanced Practice Providers (APPs -  Physician Assistants and Nurse Practitioners) who all work together to provide you with the care you need, when you need it. ? ?We recommend signing up for the patient portal called "MyChart".  Sign up information is provided on this After Visit Summary.  MyChart is used to connect with patients for Virtual Visits (Telemedicine).  Patients are able to view lab/test results, encounter notes, upcoming appointments, etc.  Non-urgent messages can be sent to your provider as well.   ?To learn more about what you can do with MyChart, go to  [email or web address].   ? ?Your next appointment:   ?6 month(s) ? ?The format for your next appointment:   ?In Person ? ?Provider:   ?Elwanda BrooklynHenry W.B. Leia AlfSmith, III, MD  ? ? ?Other Instructions ?  ?

## 2021-05-04 ENCOUNTER — Telehealth: Payer: Self-pay | Admitting: Pharmacist

## 2021-05-04 NOTE — Telephone Encounter (Signed)
Patient called stating Dr. Lyla Glassing would like to continue amoxicillin suppression for her. She will reach out to them to send new amoxicillin prescription in. ? ?Alfonse Spruce, PharmD, CPP ?Clinical Pharmacist Practitioner ?Infectious Diseases Clinical Pharmacist ?Page for Infectious Disease ? ?

## 2021-05-08 ENCOUNTER — Other Ambulatory Visit: Payer: Self-pay | Admitting: Internal Medicine

## 2021-05-08 DIAGNOSIS — T8451XD Infection and inflammatory reaction due to internal right hip prosthesis, subsequent encounter: Secondary | ICD-10-CM

## 2021-05-08 MED ORDER — AMOXICILLIN 500 MG PO CAPS: 500.0000 mg | ORAL_CAPSULE | Freq: Two times a day (BID) | ORAL | 11 refills | Status: DC

## 2021-05-09 ENCOUNTER — Ambulatory Visit: Payer: Medicare PPO | Admitting: Adult Health

## 2021-05-09 ENCOUNTER — Encounter: Payer: Self-pay | Admitting: Adult Health

## 2021-05-09 VITALS — BP 141/67 | HR 62 | Ht 59.0 in | Wt 186.0 lb

## 2021-05-09 DIAGNOSIS — G43809 Other migraine, not intractable, without status migrainosus: Secondary | ICD-10-CM | POA: Diagnosis not present

## 2021-05-09 DIAGNOSIS — M542 Cervicalgia: Secondary | ICD-10-CM | POA: Diagnosis not present

## 2021-05-09 MED ORDER — TOPIRAMATE 50 MG PO TABS: 50.0000 mg | ORAL_TABLET | Freq: Every day | ORAL | 3 refills | Status: DC

## 2021-05-09 NOTE — Progress Notes (Signed)
? ? ?PATIENT: [name] ?DOB: May 08, 1945 ? ?REASON FOR VISIT: follow up ?HISTORY FROM: patient ? ?Chief Complaint  ?Patient presents with  ? Follow-up  ?  Pt in 19  pt is here for vestibular migraines  follow up Pt states she only has has 2 migraines in a year. Pt states 2 weeks ago she had a bad nose bleed .  ? ? ? ?HISTORY OF PRESENT ILLNESS: ?Today 05/09/21: ? ?[name] is a 76 year old female. She returns today for follow-up. Continues on Topamax 50 mg daily. Only had two migraines in the last year. May get vertigo if she lays back to quick. This only happens on occasion. Currently happy with treatment ? ?Reports that 4 months ago she noticed neck stiffness. She reports no known injuries to the neck. She has to sleep on her back. Reports that her balance is a little off- feels like she may fall backwards. Pain in the neck is on both sides but only radiates to the back of the head on the right. She is unsure if its due to her sleep. Not tried  neck exercises. Takes hydrocodone already for pain. Has not tried anything else for the pain. ? ?05/17/20: [name] is a 76 year old female with a history of vestibular migraines.  She returns today for follow-up.  She is currently on Topamax 50 mg daily.  She reports that this works fairly well for her.  She states over the last year she is only had a couple of vestibular migraines.  She reports that occasionally she will have episodes of vertigo but this is typically quick and occurs when she is laying back in bed.  She denies any new symptoms.  She returns today for an evaluation. ? ?04/27/19: [name] is a 76 year old female with a history of vestibular migraines.  She reports that she has done well on Qudexy.  She reports her insurance no longer covers this medication.  Since she has been taking it she is only had 1 vestibular migraine.  She states that she typically does not have any dizziness unless she has a migraine.  She would like to switch  back to Topamax that she originally started with this medication and it worked well for her. ? ?HISTORY   ?She continue to have vertigo. Migraines are improved, just one episode. She continues on the Topamax.  The new dizziness started 4 months ago. She was in PT for her hips and legs, when she laid back she noticed it. When she goes to bed and lay down that is when she feels it. Brief and the room spins, 1 minute, sitting still improves also dizzy in the morning. She is not drinking enough fluids.  ?  ?HPI:  [name] is a 76 y.o. female here as a referral from Dr. Zachery Dauer for dizziness. Medical history migraines(since  hypertension, hypercholesterolemia, spinal stenosis, diabetes, osteoarthritis and polyarthropathy, trochanteric bursitis, vitamin D deficiency, glaucoma, rheumatoid arthritis, spondylolisthesis.October 17 she woke up with severe spinning, nausea. Then it happened again January 29th worsening, she can incapacitated with a headache for 3 days. She has headaches at the temples with light sensitivity, nausea, pounding/throbbing, feels like her eyes are going to pop out. She has daily headache. She is taking 6-8 extra strength excedrin every day. Worse in the morning and at night. Headaches wake her up. She has gained 14 pounds since July. Blurry vision and vertigo. Severe, continuous, daily headaches. She has neck pain.No other  focal neurologic deficits, associated symptoms, inciting events or modifiable factors. ?  ?Reviewed notes, labs and imaging from outside physicians, which showed: ?  ?CT head showed No acute intracranial abnormalities including mass lesion or mass effect, hydrocephalus, extra-axial fluid collection, midline shift, hemorrhage, or acute infarction, large ischemic events (personally reviewed images) ?  ? ?REVIEW OF SYSTEMS: Out of a complete 14 system review of symptoms, the patient complains only of the following symptoms, and all other reviewed systems are negative.   ? ?See HPI ? ?ALLERGIES: ?Allergies  ?Allergen Reactions  ? Sulfa Antibiotics Other (See Comments)  ?  Don't remember   ? ? ?HOME MEDICATIONS: ?Outpatient Medications Prior to Visit  ?Medication Sig Dispense Refill  ? acetaminophen (TYLENOL) 325 MG tablet Take 2 tablets (650 mg total) by mouth every 6 (six) hours as needed for mild pain (pain score 1-3 or temp > 100.5). 120 tablet 1  ? amoxicillin (AMOXIL) 500 MG capsule Take 1 capsule (500 mg total) by mouth 2 (two) times daily. 60 capsule 11  ? cetirizine (ZYRTEC) 10 MG tablet Take 10 mg by mouth daily.     ? Cholecalciferol (VITAMIN D) 50 MCG (2000 UT) tablet Take 2,000 Units by mouth daily.     ? docusate sodium (COLACE) 100 MG capsule Take 1 capsule (100 mg total) by mouth 2 (two) times daily. 60 capsule 1  ? DULoxetine (CYMBALTA) 30 MG capsule Take 60 mg by mouth daily.     ? EPIPEN 2-PAK 0.3 MG/0.3ML SOAJ injection Inject 0.3 mg into the muscle daily as needed. For anaphylactic allergic reaction  1  ? ezetimibe (ZETIA) 10 MG tablet Take 10 mg by mouth daily.    ? fenofibrate 54 MG tablet Take 54 mg by mouth daily.    ? folic acid (FOLVITE) 1 MG tablet Take 1 mg by mouth daily.    ? HYDROcodone-acetaminophen (NORCO) 7.5-325 MG tablet Take 1-4 tablets by mouth every 6 (six) hours as needed for moderate pain.    ? hydrOXYzine (ATARAX/VISTARIL) 25 MG tablet Take 25 mg by mouth daily.  1  ? latanoprost (XALATAN) 0.005 % ophthalmic solution Place 1 drop into both eyes at bedtime.     ? methotrexate 50 MG/2ML injection     ? NON FORMULARY Inject 1 Syringe into the skin 2 (two) times a week. Allergy Shots    ? pioglitazone (ACTOS) 30 MG tablet Take 30 mg by mouth daily.    ? rosuvastatin (CRESTOR) 5 MG tablet 5 mg. Take Monday, Wednesday ,friday    ? SUMAtriptan (IMITREX) 100 MG tablet Take 1 tablet (100 mg total) by mouth once as needed. May repeat in 2 hours if headache persists or recurs. 10 tablet 12  ? Tocilizumab (ACTEMRA) 200 MG/10ML SOLN     ? topiramate  (TOPAMAX) 50 MG tablet Take 1 tablet (50 mg total) by mouth at bedtime. 30 tablet 11  ? gabapentin (NEURONTIN) 100 MG capsule Take 300 mg by mouth at bedtime.     ? methotrexate 250 MG/10ML injection methotrexate sodium 25 mg/mL injection solution    ? ondansetron (ZOFRAN) 4 MG tablet Take 1 tablet (4 mg total) by mouth every 6 (six) hours as needed for nausea. (Patient not taking: Reported on 04/25/2021) 20 tablet 0  ? oxyCODONE (OXY IR/ROXICODONE) 5 MG immediate release tablet Take 1 tablet (5 mg total) by mouth every 4 (four) hours as needed for moderate pain (pain score 4-6). 42 tablet 0  ? rosuvastatin (CRESTOR) 5 MG tablet  Take 1 tablet (5 mg total) by mouth every Monday, Wednesday, and Friday. 45 tablet 3  ? ?No facility-administered medications prior to visit.  ? ? ?PAST MEDICAL HISTORY: ?Past Medical History:  ?Diagnosis Date  ? Chronic fatigue   ? Depression   ? Diabetes mellitus without complication (HCC)   ? Early cataracts, bilateral   ? Fibromyalgia   ? Glaucoma   ? Headache   ? Vestibular migraines  ? History of bronchitis   ? History of pneumonia   ? Hypercholesterolemia   ? RA (rheumatoid arthritis) (HCC)   ? RA (rheumatoid arthritis) (HCC)   ? TIA (transient ischemic attack)   ? Vertigo   ? Wears glasses   ? ? ?PAST SURGICAL HISTORY: ?Past Surgical History:  ?Procedure Laterality Date  ? APPENDECTOMY    ? BACK SURGERY  2012, 2017  ? BILATERAL CARPAL TUNNEL RELEASE    ? CESAREAN SECTION    ? x3  ? DILATION AND CURETTAGE OF UTERUS    ? GLAUCOMA SURGERY    ? KNEE ARTHROSCOPY Left   ? MOUTH SURGERY    ? TONSILLECTOMY    ? TOTAL HIP ARTHROPLASTY Right 01/30/2017  ? Procedure: RIGHT TOTAL HIP ARTHROPLASTY ANTERIOR APPROACH;  Surgeon: Kathryne Hitch, MD;  Location: [facility];  Service: Orthopedics;  Laterality: Right;  ? TOTAL HIP REVISION Right 07/28/2019  ? Procedure: TOTAL HIP REVISION, posterior approach femoal comp;  Surgeon: Samson Frederic, MD;  Location: [facility];  Service: Orthopedics;   Laterality: Right;  ? ? ?FAMILY HISTORY: ?Family History  ?Problem Relation Age of Onset  ? Diabetes Mother   ? Stroke Mother   ? Heart disease Mother   ? High blood pressure Father   ? Stroke Father   ? Diabetes Fathe

## 2021-05-09 NOTE — Patient Instructions (Signed)
Your Plan: ? ?Continue topamax 50 mg daily ?Referral for PT ? ? ? ? ?Thank you for coming to see [place] at [facility]. I hope we have been able to provide you high quality care today. ? ?You may receive a patient satisfaction survey over the next few weeks. We would appreciate your feedback and comments so that we may continue to improve ourselves and the health of our patients. ? ?

## 2021-05-14 ENCOUNTER — Ambulatory Visit (HOSPITAL_COMMUNITY): Payer: Medicare PPO | Attending: Internal Medicine

## 2021-05-14 DIAGNOSIS — R0609 Other forms of dyspnea: Secondary | ICD-10-CM | POA: Insufficient documentation

## 2021-05-14 DIAGNOSIS — R011 Cardiac murmur, unspecified: Secondary | ICD-10-CM | POA: Diagnosis present

## 2021-05-14 LAB — ECHOCARDIOGRAM COMPLETE
Area-P 1/2: 2.95 cm2
S' Lateral: 2.7 cm

## 2021-05-14 MED ORDER — PERFLUTREN LIPID MICROSPHERE
3.0000 mL | INTRAVENOUS | Status: AC | PRN
  Administered 2021-05-14: 3 mL via INTRAVENOUS

## 2021-05-16 ENCOUNTER — Encounter: Payer: Self-pay | Admitting: *Deleted

## 2021-05-16 ENCOUNTER — Telehealth: Payer: Self-pay | Admitting: *Deleted

## 2021-05-16 DIAGNOSIS — I5189 Other ill-defined heart diseases: Secondary | ICD-10-CM

## 2021-05-16 MED ORDER — EMPAGLIFLOZIN 10 MG PO TABS: 10.0000 mg | ORAL_TABLET | Freq: Every day | ORAL | 11 refills | Status: DC

## 2021-05-16 NOTE — Telephone Encounter (Signed)
-----   Message from Belva Crome, MD sent at 05/16/2021  9:00 AM EDT ----- ?Let the patient know she has diastolic dysfunction.  Heart is stiffer than normal.  The current management strategy is [place] or Jardiance 10 mg/day.  I will recommend that she start 1 of those, have a bmet in 10 to 14 days, and be reassessed in 4 to 8 weeks all medication. ?A copy will be sent to Rankins, Bill Salinas, MD ?

## 2021-05-16 NOTE — Telephone Encounter (Signed)
Spoke with pt and reviewed results and recommendations.  Pt will come by to get samples of Jardiance.  She will come for BMET on 05/28/21.  Scheduled her to see Dr. Tamala Julian on 5/17 and moved fasting labs to same day.  Pt verbalized understanding and was in agreement with plan.  ?

## 2021-05-24 ENCOUNTER — Ambulatory Visit: Payer: Medicare PPO | Attending: Adult Health

## 2021-05-24 DIAGNOSIS — R252 Cramp and spasm: Secondary | ICD-10-CM

## 2021-05-24 DIAGNOSIS — R293 Abnormal posture: Secondary | ICD-10-CM | POA: Diagnosis not present

## 2021-05-24 DIAGNOSIS — M542 Cervicalgia: Secondary | ICD-10-CM | POA: Insufficient documentation

## 2021-05-24 DIAGNOSIS — M6281 Muscle weakness (generalized): Secondary | ICD-10-CM | POA: Diagnosis not present

## 2021-05-24 NOTE — Therapy (Signed)
?OUTPATIENT PHYSICAL THERAPY CERVICAL EVALUATION ? ? ?Patient Name: [name] ?MRN: [id number] ?DOB:01/12/1946, 76 y.o., female ?Today's Date: 05/24/2021 ? ? PT End of Session - 05/24/21 1151   ? ? Visit Number 1   ? Date for PT Re-Evaluation 07/19/21   ? Authorization Type [employer]   ? PT Start Time 1145   ? PT Stop Time 1230   ? PT Time Calculation (min) 45 min   ? Activity Tolerance Patient limited by pain   ? [facility] for tasks assessed/performed   ? ?  ?  ? ?  ? ? ?Past Medical History:  ?Diagnosis Date  ? Chronic fatigue   ? Depression   ? Diabetes mellitus without complication (HCC)   ? Early cataracts, bilateral   ? Fibromyalgia   ? Glaucoma   ? Headache   ? Vestibular migraines  ? History of bronchitis   ? History of pneumonia   ? Hypercholesterolemia   ? RA (rheumatoid arthritis) (HCC)   ? RA (rheumatoid arthritis) (HCC)   ? TIA (transient ischemic attack)   ? Vertigo   ? Wears glasses   ? ?Past Surgical History:  ?Procedure Laterality Date  ? APPENDECTOMY    ? BACK SURGERY  2012, 2017  ? BILATERAL CARPAL TUNNEL RELEASE    ? CESAREAN SECTION    ? x3  ? DILATION AND CURETTAGE OF UTERUS    ? GLAUCOMA SURGERY    ? KNEE ARTHROSCOPY Left   ? MOUTH SURGERY    ? TONSILLECTOMY    ? TOTAL HIP ARTHROPLASTY Right 01/30/2017  ? Procedure: RIGHT TOTAL HIP ARTHROPLASTY ANTERIOR APPROACH;  Surgeon: Kathryne HitchBlackman, Christopher Y, MD;  Location: [facility];  Service: Orthopedics;  Laterality: Right;  ? TOTAL HIP REVISION Right 07/28/2019  ? Procedure: TOTAL HIP REVISION, posterior approach femoal comp;  Surgeon: Samson FredericSwinteck, Brian, MD;  Location: [facility];  Service: Orthopedics;  Laterality: Right;  ? ?Patient Active Problem List  ? Diagnosis Date Noted  ? Acute bronchitis 03/23/2020  ? Elevated troponin 09/23/2019  ? 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 Enterococcal infection 08/24/2019  ? Rheumatoid arthritis (HCC) 08/24/2019  ? 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 Acute deep vein thrombosis (DVT) of right lower extremity (HCC) 04/01/2017  ? Epistaxis 03/14/2017  ? Status post total replacement of right hip 01/30/2017  ? Avascular necrosis of bone of hip, right (HCC) 12/18/2016  ? Trochanteric bursitis, right hip 11/04/2016  ? Pain of right hip joint 11/04/2016  ? S/P lumbar spinal fusion 07/21/2015  ? ? ?PCP: Rankins, Fanny DanceVictoria R, MD ? ?REFERRING PROVIDER: Butch PennyMillikan, Megan, NP ? ?REFERRING DIAG: M54.2 (ICD-10-CM) - Neck pain  ? ?THERAPY DIAG:  ?Cervicalgia ? ?Cramp and spasm ? ?Muscle weakness (generalized) ? ?Abnormal posture ? ?ONSET DATE: 05/09/21 ? ?SUBJECTIVE:                                                                                                                                                                                                        ? ?  SUBJECTIVE STATEMENT: ?Patient has multiple orthopedic issues including bilateral shoulder pain, right hip pain (THA with subsequent revision), and left knee pain.  She is here today for neck pain.  She explains that she cant turn her head to back up or look for oncoming traffic when she is driving.  She is unable to sleep in bed any more because of her hip and knee pain.  She sleeps in a recliner.  She feels she may need a new pillow.  She has hx of vertigo and migraines that are also contributing to her inability to sleep in bed.  She feels her migraines are better controlled. She is widowed and lives alone but has a daughter that lives near by.  She hopes to get her neck pain down and avoid return of migraines.   ? ?PERTINENT HISTORY:  ?RA, notes from recent MD f/u:Ms. Carraway is a 76 year old female. She returns today for follow-up. Continues on Topamax 50 mg daily. Only had two migraines in the last year. May get vertigo if she lays back to quick. This only happens on occasion. Currently happy with treatment ?  ?Reports that 4 months ago she noticed  neck stiffness. She reports no known injuries to the neck. She has to sleep on her back. Reports that her balance is a little off- feels like she may fall backwards. Pain in the neck is on both sides but only radiates to the back of the head on the right. She is unsure if its due to her sleep. Not tried  neck exercises. Takes hydrocodone already for pain. Has not tried anything else for the pain.  ? ?PAIN:  ?Are you having pain? Yes: NPRS scale: 4/10 ?Pain location: right upper trap and sub-occipital area ?Pain description: aching ?Aggravating factors: turning her head  ?Relieving factors: medication, heat   ? ?PRECAUTIONS: Cervical ? ?WEIGHT BEARING RESTRICTIONS No ? ?FALLS:  ?Has patient fallen in last 6 months? No ? ?LIVING ENVIRONMENT: ?Lives with: lives alone ?Lives in: House/apartment ?Stairs: Yes: External: 4 but has ramp also steps; can reach both ?Has following equipment at home: Single point cane ? ?OCCUPATION: retired ? ?PLOF: Independent, Independent with basic ADLs, Independent with household mobility with device, Independent with community mobility with device, and Independent with transfers ? ?PATIENT GOALS : To be pain free or as much reduction in pain as possible  ? ?OBJECTIVE:  ? ?DIAGNOSTIC FINDINGS:  ?None in patient chart ? ?PATIENT SURVEYS:  ?FOTO 40 ? ? ?COGNITION: ?Overall cognitive status: Within functional limits for tasks assessed ? ? ?SENSATION: ?WFL ? ?POSTURE:  ?Rounded shoulders, fwd head, thoracic kyphosis, hips flexed, fwd trunk, knees flexed ? ?PALPATION: ?Tight and tender to palpation bilateral upper traps and suboccipital area.    ? ?CERVICAL ROM:  ? ?Active ROM A/PROM (deg) ?05/24/2021  ?Flexion WNL  ?Extension 25%  ?Right lateral flexion 50%  ?Left lateral flexion 50%  ?Right rotation 50%  ?Left rotation 50%  ? (Blank rows = not tested) ? ?UE ROM: ? ?Limited UE flexion and abduction to approx 120 degrees ? ?UE MMT: ?Weakness bilateral shoulder/rotator cuff: generally 4-/5  bilaterally ? ? ? ?PATIENT SURVEYS:  ?FOTO 40 ? ?TODAY'S TREATMENT:  ?Initial eval completed and initiated HEP ? ? ?PATIENT EDUCATION:  ?Education details: Spent as much as 20 min discussing patients overall postural issues that are likely contributing her pain and explained kinetic chain issues and how her left knee pain is possibly contributing to her hip pain.  She  is unable to sleep in bed and is having to sleep in a recliner which is not good for her neck and overall posture.  Encouraged patient to consider the long term effect of putting off her right knee replacement which has been highly recommended.   ?Person educated: Patient ?Education method: Explanation ?Education comprehension: verbalized understanding ? ? ?HOME EXERCISE PROGRAM: ?Access Code: [facility] ?URL: [email or web address] ?Date: 05/24/2021 ?Prepared by: Mikey Kirschner ? ?Exercises ?- Seated Scapular Retraction  - 1 x daily - 7 x weekly - 3 sets - 10 reps ?- Shoulder Rolls in Sitting  - 1 x daily - 7 x weekly - 3 sets - 10 reps ?- Seated Cervical Retraction  - 1 x daily - 7 x weekly - 3 sets - 10 reps ?- Seated Cervical Rotation AROM  - 1 x daily - 7 x weekly - 3 sets - 10 reps ?- Seated Cervical Sidebending AROM  - 1 x daily - 7 x weekly - 3 sets - 10 reps ? ?ASSESSMENT: ? ?CLINICAL IMPRESSION: ?Patient is a 76 y.o. female who was seen today for physical therapy evaluation and treatment for neck pain. She presents with poor posture and weakness bilateral upper extremities and parascapular areas.  She has multiple orthopedic issues which have led her to sleeping in recliner.  She hopes to get some relief from her neck pain and be able to look safely for oncoming traffic and be able to turn her head for backing up. She also hopes to be able to sleep in bed again but she doesn't feel her hip and knee will allow this.   ? ? ?OBJECTIVE IMPAIRMENTS Abnormal gait, decreased mobility, decreased ROM, decreased strength, dizziness,  hypomobility, increased fascial restrictions, increased muscle spasms, impaired flexibility, impaired UE functional use, postural dysfunction, and pain.  ? ?ACTIVITY LIMITATIONS cleaning, community activity, driving, m

## 2021-05-29 ENCOUNTER — Ambulatory Visit: Payer: Medicare PPO

## 2021-05-29 DIAGNOSIS — M6281 Muscle weakness (generalized): Secondary | ICD-10-CM

## 2021-05-29 DIAGNOSIS — M542 Cervicalgia: Secondary | ICD-10-CM | POA: Diagnosis not present

## 2021-05-29 DIAGNOSIS — R293 Abnormal posture: Secondary | ICD-10-CM

## 2021-05-29 DIAGNOSIS — R252 Cramp and spasm: Secondary | ICD-10-CM

## 2021-05-29 NOTE — Therapy (Signed)
?OUTPATIENT PHYSICAL THERAPY TREATMENT NOTE ? ? ?Patient Name: [name] ?MRN: [id number] ?DOB:Dec 23, 1945, 76 y.o., female ?Today's Date: 05/29/2021 ? ?PCP: Clayborn Heron, MD ?REFERRING PROVIDER: Butch Penny, NP ? ?END OF SESSION:  ? PT End of Session - 05/29/21 1250   ? ? Visit Number 2   ? Date for PT Re-Evaluation 07/19/21   ? Authorization Type [employer]   ? Authorization Time Period [place]-Approved 12 vl-05/24/2021-07/19/2021-Auth#[id number]  [employer] Medicare   ? Authorization - Visit Number 2   ? Authorization - Number of Visits 12   ? Progress Note Due on Visit 10   ? PT Start Time 1230   ? PT Stop Time 1315   ? PT Time Calculation (min) 45 min   ? Activity Tolerance Patient limited by pain   ? Behavior During Therapy [facility] for tasks assessed/performed   ? ?  ?  ? ?  ? ? ?Past Medical History:  ?Diagnosis Date  ? Chronic fatigue   ? Depression   ? Diabetes mellitus without complication (HCC)   ? Early cataracts, bilateral   ? Fibromyalgia   ? Glaucoma   ? Headache   ? Vestibular migraines  ? History of bronchitis   ? History of pneumonia   ? Hypercholesterolemia   ? RA (rheumatoid arthritis) (HCC)   ? RA (rheumatoid arthritis) (HCC)   ? TIA (transient ischemic attack)   ? Vertigo   ? Wears glasses   ? ?Past Surgical History:  ?Procedure Laterality Date  ? APPENDECTOMY    ? BACK SURGERY  2012, 2017  ? BILATERAL CARPAL TUNNEL RELEASE    ? CESAREAN SECTION    ? x3  ? DILATION AND CURETTAGE OF UTERUS    ? GLAUCOMA SURGERY    ? KNEE ARTHROSCOPY Left   ? MOUTH SURGERY    ? TONSILLECTOMY    ? TOTAL HIP ARTHROPLASTY Right 01/30/2017  ? Procedure: RIGHT TOTAL HIP ARTHROPLASTY ANTERIOR APPROACH;  Surgeon: Kathryne Hitch, MD;  Location: [facility];  Service: Orthopedics;  Laterality: Right;  ? TOTAL HIP REVISION Right 07/28/2019  ? Procedure: TOTAL HIP REVISION, posterior approach femoal comp;  Surgeon: Samson Frederic, MD;  Location: [facility];  Service: Orthopedics;  Laterality: Right;  ? ?Patient  Active Problem List  ? Diagnosis Date Noted  ? Acute bronchitis 03/23/2020  ? Elevated troponin 09/23/2019  ? 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 Enterococcal infection 08/24/2019  ? Rheumatoid arthritis (HCC) 08/24/2019  ? 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 Acute deep vein thrombosis (DVT) of right lower extremity (HCC) 04/01/2017  ? Epistaxis 03/14/2017  ? Status post total replacement of right hip 01/30/2017  ? Avascular necrosis of bone of hip, right (HCC) 12/18/2016  ? Trochanteric bursitis, right hip 11/04/2016  ? Pain of right hip joint 11/04/2016  ? S/P lumbar spinal fusion 07/21/2015  ? ? ?REFERRING DIAG:  M54.2 (ICD-10-CM) - Neck pain  ? ?THERAPY DIAG:  ?Cervicalgia ? ?Cramp and spasm ? ?Muscle weakness (generalized) ? ?Abnormal posture ? ?PERTINENT HISTORY: See above; fibromyalgia ? ?PRECAUTIONS: fall ? ?SUBJECTIVE: Patient states she saw another MD about her hip. She will be having PT for her hip.   ? ?PAIN:  ?Are you having pain? Yes: NPRS scale: 5/10 ?Pain location: neck and shoulders ?Pain description: aching ?Aggravating factors: anything ?Relieving factors: meds ? ? ?OBJECTIVE: (objective measures completed at initial evaluation unless otherwise dated) ? ? ?  OBJECTIVE:  ?  ?DIAGNOSTIC FINDINGS:  ?None in patient chart ?  ?PATIENT SURVEYS:  ?FOTO 40 ?  ?  ?COGNITION: ?Overall cognitive status: Within functional limits for tasks assessed ?  ?  ?SENSATION: ?WFL ?  ?POSTURE:  ?Rounded shoulders, fwd head, thoracic kyphosis, hips flexed, fwd trunk, knees flexed ?  ?PALPATION: ?Tight and tender to palpation bilateral upper traps and suboccipital area.              ?  ?CERVICAL ROM:  ?  ?Active ROM A/PROM (deg) ?05/24/2021  ?Flexion WNL  ?Extension 25%  ?Right lateral flexion 50%  ?Left lateral flexion 50%  ?Right rotation 50%  ?Left rotation 50%  ? (Blank rows = not tested) ?  ?UE ROM: ?  ?Limited UE flexion  and abduction to approx 120 degrees ?  ?UE MMT: ?Weakness bilateral shoulder/rotator cuff: generally 4-/5 bilaterally ?  ?  ?  ?PATIENT SURVEYS:  ?FOTO 40 ?  ?TODAY'S TREATMENT: 05/29/21 ?NuStep x 5 min (PT present to discuss HEP and progress along with assessing pain) ?Standing bilateral shoulder extension with yellow tband x 20 ?Standing bilateral shoulder rows with yellow tband x 20 ?Seated bilateral shoulder ER with yellow tband ?Seated bilateral horizontal abduction with yellow tband ?Instructed in adding these 4 new exercises to HEP and provided handouts ?Shoulder rolls x 10 ?Supine manual c spine passive ROM, STM to bilateral upper traps and cervical paraspinals, sub occipital release, bilateral upper trap and levator stretch  ? ?TODAY'S TREATMENT:  ?Initial eval completed and initiated HEP ?  ?  ?PATIENT EDUCATION:  ?Education details: Reviewed HEP.   ?Person educated: Patient ?Education method: Explanation ?Education comprehension: verbalized understanding ?  ?  ?HOME EXERCISE PROGRAM: ?Access Code: [facility] ?URL: [email or web address] ?Date: 05/29/2021 ?Prepared by: Mikey Kirschner ? ?Exercises ?- Seated Scapular Retraction  - 1 x daily - 7 x weekly - 3 sets - 10 reps ?- Shoulder Rolls in Sitting  - 1 x daily - 7 x weekly - 3 sets - 10 reps ?- Seated Cervical Retraction  - 1 x daily - 7 x weekly - 3 sets - 10 reps ?- Seated Cervical Rotation AROM  - 1 x daily - 7 x weekly - 3 sets - 10 reps ?- Seated Cervical Sidebending AROM  - 1 x daily - 7 x weekly - 3 sets - 10 reps ?- Shoulder extension with resistance - Neutral  - 1 x daily - 7 x weekly - 3 sets - 10 reps ?- Standing Shoulder Row with Anchored Resistance  - 1 x daily - 7 x weekly - 3 sets - 10 reps ?- Seated Shoulder External Rotation AAROM with Pulley  - 1 x daily - 7 x weekly - 3 sets - 10 reps ?- Seated Shoulder Horizontal Abduction with Resistance  - 1 x daily - 7 x weekly - 3 sets - 10 reps ?  ?ASSESSMENT: ?  ?CLINICAL  IMPRESSION: ?Alexandra responded very well to treatment today but treatment is limited by her inability to move well.  She needed additional time to move from one task to another.  She was able to lie supine with pillow under her head and balance pad under that for manual techniques.  She denied any dizziness with this throughout the supine manual techniques and none when transitioning from supine to sit.  She reported overall decreased pain at end of session.  She would benefit from continued skilled PT for postural strengthening and c spine mobility.  She may be  receiving a referral from her MD for her right hip but she mentions she may want to just focus on the 12 visits for her neck then be evaluated for her hip.   ?  ?OBJECTIVE IMPAIRMENTS Abnormal gait, decreased mobility, decreased ROM, decreased strength, dizziness, hypomobility, increased fascial restrictions, increased muscle spasms, impaired flexibility, impaired UE functional use, postural dysfunction, and pain.  ?  ?ACTIVITY LIMITATIONS cleaning, community activity, driving, meal prep, laundry, yard work, and shopping.  ?  ?PERSONAL FACTORS Fitness, Past/current experiences, Time since onset of injury/illness/exacerbation, and 1-2 comorbidities: depression, fibromyalgia  are also affecting patient's functional outcome.  ?  ?  ?REHAB POTENTIAL: Fair due to mutliple orthopedic issues and sleeping in recliner ?  ?CLINICAL DECISION MAKING: Evolving/moderate complexity ?  ?EVALUATION COMPLEXITY: Moderate ?  ?  ?GOALS: ?Goals reviewed with patient? Yes ?  ?SHORT TERM GOALS: Target date: 06/21/2021 ?  ?Patient will be independent with initial HEP  ?Baseline: na ?Goal status: INITIAL ?  ?2.  Pain report to be no greater than 4/10  ?Baseline: na ?Goal status: INITIAL ?  ?  ?LONG TERM GOALS: Target date: 07/19/2021 ?  ? Patient to be independent with advanced HEP  ?Baseline: na ?Goal status: INITIAL ?  ?2.  Patient to report pain no greater than 2/10  ?Baseline:  na ?Goal status: INITIAL ?  ?3.  FOTO to be 54 ?Baseline: 40 ?Goal status: INITIAL ?  ?4.  Patient to report 50% improvement in overall symptoms ?Baseline: na ?Goal status: INITIAL ?  ?5.  Patient to be able to rotate

## 2021-05-30 ENCOUNTER — Ambulatory Visit: Payer: Medicare PPO | Admitting: Physical Therapy

## 2021-05-30 ENCOUNTER — Encounter: Payer: Self-pay | Admitting: Physical Therapy

## 2021-05-30 DIAGNOSIS — M542 Cervicalgia: Secondary | ICD-10-CM

## 2021-05-30 DIAGNOSIS — R293 Abnormal posture: Secondary | ICD-10-CM

## 2021-05-30 DIAGNOSIS — R252 Cramp and spasm: Secondary | ICD-10-CM

## 2021-05-30 DIAGNOSIS — M6281 Muscle weakness (generalized): Secondary | ICD-10-CM

## 2021-05-30 NOTE — Therapy (Signed)
?OUTPATIENT PHYSICAL THERAPY TREATMENT NOTE ? ? ?Patient Name: Amanda GreenlandLinda [name] [name] ?MRN: [id number] ?DOB:02-17-1945, 76 y.o., female ?Today's Date: 05/30/2021 ? ?PCP: Clayborn Heronankins, Victoria R, MD ?REFERRING PROVIDER: Butch PennyMillikan, Megan, NP ? ?END OF SESSION:  ? PT End of Session - 05/30/21 1229   ? ? Visit Number 3   ? Date for PT Re-Evaluation 07/19/21   ? Authorization Type [employer]   ? Authorization Time Period Cohere-Approved 12 vl-05/24/2021-07/19/2021-Auth#[id number]  [employer]   ? Authorization - Visit Number 3   ? Authorization - Number of Visits 12   ? Progress Note Due on Visit 10   ? PT Start Time 1230   ? PT Stop Time 1315   ? PT Time Calculation (min) 45 min   ? Activity Tolerance Patient limited by pain   ? [facility]/performed   ? ?  ?  ? ?  ? ? ? ?Past Medical History:  ?Diagnosis Date  ? Chronic fatigue   ? Depression   ? Diabetes mellitus without complication (HCC)   ? Early cataracts, bilateral   ? Fibromyalgia   ? Glaucoma   ? Headache   ? Vestibular migraines  ? History of bronchitis   ? History of pneumonia   ? Hypercholesterolemia   ? RA (rheumatoid arthritis) (HCC)   ? RA (rheumatoid arthritis) (HCC)   ? TIA (transient ischemic attack)   ? Vertigo   ? Wears glasses   ? ?Past Surgical History:  ?Procedure Laterality Date  ? APPENDECTOMY    ? BACK SURGERY  2012, 2017  ? BILATERAL CARPAL TUNNEL RELEASE    ? CESAREAN SECTION    ? x3  ? DILATION AND CURETTAGE OF UTERUS    ? GLAUCOMA SURGERY    ? KNEE ARTHROSCOPY Left   ? MOUTH SURGERY    ? TONSILLECTOMY    ? TOTAL HIP ARTHROPLASTY Right 01/30/2017  ? Procedure: RIGHT TOTAL HIP ARTHROPLASTY ANTERIOR APPROACH;  Surgeon: Kathryne HitchBlackman, Christopher Y, MD;  Location: [facility];  Service: Orthopedics;  Laterality: Right;  ? TOTAL HIP REVISION Right 07/28/2019  ? Procedure: TOTAL HIP REVISION, posterior approach femoal comp;  Surgeon: Samson FredericSwinteck, Brian, MD;  Location: [facility];  Service: Orthopedics;  Laterality: Right;   ? ?Patient Active Problem List  ? Diagnosis Date Noted  ? Acute bronchitis 03/23/2020  ? Elevated troponin 09/23/2019  ? 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 Enterococcal infection 08/24/2019  ? Rheumatoid arthritis (HCC) 08/24/2019  ? 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 Acute deep vein thrombosis (DVT) of right lower extremity (HCC) 04/01/2017  ? Epistaxis 03/14/2017  ? Status post total replacement of right hip 01/30/2017  ? Avascular necrosis of bone of hip, right (HCC) 12/18/2016  ? Trochanteric bursitis, right hip 11/04/2016  ? Pain of right hip joint 11/04/2016  ? S/P lumbar spinal fusion 07/21/2015  ? ? ?REFERRING DIAG:  M54.2 (ICD-10-CM) - Neck pain  ? ?THERAPY DIAG:  ?Cervicalgia ? ?Cramp and spasm ? ?Muscle weakness (generalized) ? ?Abnormal posture ? ?PERTINENT HISTORY: See above; fibromyalgia ? ?PRECAUTIONS: fall ? ?SUBJECTIVE: the massage felt so good yesterday.  I think I can turn my head a little better today. ? ?PAIN:  ?Are you having pain? Yes: NPRS scale: 3-4/10 ?Pain location: neck and shoulders ?Pain description: aching ?Aggravating factors: anything ?Relieving factors: meds ? ? ?OBJECTIVE: (objective measures completed at initial evaluation unless otherwise dated) ? ? ?  OBJECTIVE:  ?  ?DIAGNOSTIC FINDINGS:  ?None in patient chart ?  ?PATIENT SURVEYS:  ?FOTO 40 ?  ?  ?COGNITION: ?Overall cognitive status: Within functional limits for tasks assessed ?  ?  ?SENSATION: ?WFL ?  ?POSTURE:  ?Rounded shoulders, fwd head, thoracic kyphosis, hips flexed, fwd trunk, knees flexed ?  ?PALPATION: ?Tight and tender to palpation bilateral upper traps and suboccipital area.              ?  ?CERVICAL ROM:  ?  ?Active ROM A/PROM (deg) ?05/24/2021  ?Flexion WNL  ?Extension 25%  ?Right lateral flexion 50%  ?Left lateral flexion 50%  ?Right rotation 50%  ?Left rotation 50%  ? (Blank rows = not tested) ?  ?UE ROM: ?   ?Limited UE flexion and abduction to approx 120 degrees ?  ?UE MMT: ?Weakness bilateral shoulder/rotator cuff: generally 4-/5 bilaterally ?  ?  ?  ?PATIENT SURVEYS:  ?FOTO 40 ?  ?TODAY'S TREATMENT:  ?05/30/21 ?Nustep L1 x 5' PT present to discuss response to treatment and plan for today ?Standing bilateral shoulder extension with yellow tband x 20 ?Standing bilateral shoulder rows with yellow tband x 20 ?Seated bilateral shoulder ER with yellow tband ?Standing bilateral horizontal abduction with yellow tband ?Shoulder rolls x 10 ?Wall push ups x 12 ?Supine manual treatment (pillow + balance pad under pillow): STM bil upper traps, SO, scalenes, flexion/rotation P/ROM stretch ? ? ?05/29/21 ?NuStep x 5 min (PT present to discuss HEP and progress along with assessing pain) ?Standing bilateral shoulder extension with yellow tband x 20 ?Standing bilateral shoulder rows with yellow tband x 20 ?Seated bilateral shoulder ER with yellow tband ?Seated bilateral horizontal abduction with yellow tband ?Instructed in adding these 4 new exercises to HEP and provided handouts ?Shoulder rolls x 10 ?Supine manual c spine passive ROM, STM to bilateral upper traps and cervical paraspinals, sub occipital release, bilateral upper trap and levator stretch  ? ?TODAY'S TREATMENT:  ?Initial eval completed and initiated HEP ?  ?  ?PATIENT EDUCATION:  ?Education details: Reviewed HEP.   ?Person educated: Patient ?Education method: Explanation ?Education comprehension: verbalized understanding ?  ?  ?HOME EXERCISE PROGRAM: ?Access Code: [facility] ?URL: [email or web address] ?Date: 05/29/2021 ?Prepared by: Mikey Kirschner ? ?Exercises ?- Seated Scapular Retraction  - 1 x daily - 7 x weekly - 3 sets - 10 reps ?- Shoulder Rolls in Sitting  - 1 x daily - 7 x weekly - 3 sets - 10 reps ?- Seated Cervical Retraction  - 1 x daily - 7 x weekly - 3 sets - 10 reps ?- Seated Cervical Rotation AROM  - 1 x daily - 7 x weekly - 3 sets - 10 reps ?-  Seated Cervical Sidebending AROM  - 1 x daily - 7 x weekly - 3 sets - 10 reps ?- Shoulder extension with resistance - Neutral  - 1 x daily - 7 x weekly - 3 sets - 10 reps ?- Standing Shoulder Row with Anchored Resistance  - 1 x daily - 7 x weekly - 3 sets - 10 reps ?- Seated Shoulder External Rotation AAROM with Pulley  - 1 x daily - 7 x weekly - 3 sets - 10 reps ?- Seated Shoulder Horizontal Abduction with Resistance  - 1 x daily - 7 x weekly - 3 sets - 10 reps ?  ?ASSESSMENT: ?  ?CLINICAL IMPRESSION: ?Nazaria arrived with high praise of yesterday's treatment, saying the massage helped her a lot.  She felt she could  turn her head better.  PT reviewed yesterday's new therex with yellow bands and provided VC and demo for more scapular retraction within therex.  We transitioned seated horiz abd to standing to emphasize whole body upright posture and chest opening.  Pt tolerates mod/deep pressure with supine manual techniques well.  ?  ?OBJECTIVE IMPAIRMENTS Abnormal gait, decreased mobility, decreased ROM, decreased strength, dizziness, hypomobility, increased fascial restrictions, increased muscle spasms, impaired flexibility, impaired UE functional use, postural dysfunction, and pain.  ?  ?ACTIVITY LIMITATIONS cleaning, community activity, driving, meal prep, laundry, yard work, and shopping.  ?  ?PERSONAL FACTORS Fitness, Past/current experiences, Time since onset of injury/illness/exacerbation, and 1-2 comorbidities: depression, fibromyalgia  are also affecting patient's functional outcome.  ?  ?  ?REHAB POTENTIAL: Fair due to mutliple orthopedic issues and sleeping in recliner ?  ?CLINICAL DECISION MAKING: Evolving/moderate complexity ?  ?EVALUATION COMPLEXITY: Moderate ?  ?  ?GOALS: ?Goals reviewed with patient? Yes ?  ?SHORT TERM GOALS: Target date: 06/21/2021 ?  ?Patient will be independent with initial HEP  ?Baseline: na ?Goal status: INITIAL ?  ?2.  Pain report to be no greater than 4/10  ?Baseline: na ?Goal  status: INITIAL ?  ?  ?LONG TERM GOALS: Target date: 07/19/2021 ?  ? Patient to be independent with advanced HEP  ?Baseline: na ?Goal status: INITIAL ?  ?2.  Patient to report pain no greater than 2/10  ?Baseline: na

## 2021-06-05 DIAGNOSIS — M0589 Other rheumatoid arthritis with rheumatoid factor of multiple sites: Secondary | ICD-10-CM | POA: Diagnosis not present

## 2021-06-06 ENCOUNTER — Ambulatory Visit: Payer: Medicare PPO | Attending: Adult Health

## 2021-06-06 DIAGNOSIS — R252 Cramp and spasm: Secondary | ICD-10-CM | POA: Insufficient documentation

## 2021-06-06 DIAGNOSIS — M6281 Muscle weakness (generalized): Secondary | ICD-10-CM | POA: Diagnosis not present

## 2021-06-06 DIAGNOSIS — R293 Abnormal posture: Secondary | ICD-10-CM | POA: Insufficient documentation

## 2021-06-06 DIAGNOSIS — M542 Cervicalgia: Secondary | ICD-10-CM | POA: Insufficient documentation

## 2021-06-06 NOTE — Therapy (Signed)
?OUTPATIENT PHYSICAL THERAPY TREATMENT NOTE ? ? ?Patient Name: [name] ?MRN: [id number] ?DOB:09-14-1945, 76 y.o., female ?Today's Date: 06/06/2021 ? ?PCP: Clayborn Heron, MD ?REFERRING PROVIDER: Clayborn Heron, MD ? ?END OF SESSION:  ? PT End of Session - 06/06/21 1400   ? ? Visit Number 4   ? Date for PT Re-Evaluation 07/19/21   ? Authorization Type [employer]   ? Authorization Time Period Cohere-Approved 12 vl-05/24/2021-07/19/2021-Auth#[id number]  [employer]   ? Authorization - Visit Number 4   ? Authorization - Number of Visits 12   ? Progress Note Due on Visit 10   ? PT Start Time 1400   ? PT Stop Time 1448   ? PT Time Calculation (min) 48 min   ? Activity Tolerance Patient limited by pain   ? Behavior During Therapy [facility] for tasks assessed/performed   ? ?  ?  ? ?  ? ? ? ?Past Medical History:  ?Diagnosis Date  ? Chronic fatigue   ? Depression   ? Diabetes mellitus without complication (HCC)   ? Early cataracts, bilateral   ? Fibromyalgia   ? Glaucoma   ? Headache   ? Vestibular migraines  ? History of bronchitis   ? History of pneumonia   ? Hypercholesterolemia   ? RA (rheumatoid arthritis) (HCC)   ? RA (rheumatoid arthritis) (HCC)   ? TIA (transient ischemic attack)   ? Vertigo   ? Wears glasses   ? ?Past Surgical History:  ?Procedure Laterality Date  ? APPENDECTOMY    ? BACK SURGERY  2012, 2017  ? BILATERAL CARPAL TUNNEL RELEASE    ? CESAREAN SECTION    ? x3  ? DILATION AND CURETTAGE OF UTERUS    ? GLAUCOMA SURGERY    ? KNEE ARTHROSCOPY Left   ? MOUTH SURGERY    ? TONSILLECTOMY    ? TOTAL HIP ARTHROPLASTY Right 01/30/2017  ? Procedure: RIGHT TOTAL HIP ARTHROPLASTY ANTERIOR APPROACH;  Surgeon: Kathryne Hitch, MD;  Location: [facility];  Service: Orthopedics;  Laterality: Right;  ? TOTAL HIP REVISION Right 07/28/2019  ? Procedure: TOTAL HIP REVISION, posterior approach femoal comp;  Surgeon: Samson Frederic, MD;  Location: [facility];  Service: Orthopedics;  Laterality: Right;   ? ?Patient Active Problem List  ? Diagnosis Date Noted  ? Acute bronchitis 03/23/2020  ? Elevated troponin 09/23/2019  ? 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 Enterococcal infection 08/24/2019  ? Rheumatoid arthritis (HCC) 08/24/2019  ? 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 Acute deep vein thrombosis (DVT) of right lower extremity (HCC) 04/01/2017  ? Epistaxis 03/14/2017  ? Status post total replacement of right hip 01/30/2017  ? Avascular necrosis of bone of hip, right (HCC) 12/18/2016  ? Trochanteric bursitis, right hip 11/04/2016  ? Pain of right hip joint 11/04/2016  ? S/P lumbar spinal fusion 07/21/2015  ? ? ?REFERRING DIAG:  M54.2 (ICD-10-CM) - Neck pain  ? ?THERAPY DIAG:  ?Cervicalgia ? ?Cramp and spasm ? ?Muscle weakness (generalized) ? ?Abnormal posture ? ?PERTINENT HISTORY: See above; fibromyalgia ? ?PRECAUTIONS: fall ? ?SUBJECTIVE: Patient states she is doing ok.  My hip is hurting but the last treatment for my neck went well. ? ?PAIN:  ?Are you having pain? Yes: NPRS scale: 3-4/10 ?Pain location: neck and shoulders ?Pain description: aching ?Aggravating factors: anything ?Relieving factors: meds ? ? ?OBJECTIVE: (objective measures completed at initial evaluation  unless otherwise dated) ? ? ?OBJECTIVE:  ?  ?DIAGNOSTIC FINDINGS:  ?None in patient chart ?  ?PATIENT SURVEYS:  ?FOTO 40 ?  ?  ?COGNITION: ?Overall cognitive status: Within functional limits for tasks assessed ?  ?  ?SENSATION: ?WFL ?  ?POSTURE:  ?Rounded shoulders, fwd head, thoracic kyphosis, hips flexed, fwd trunk, knees flexed ?  ?PALPATION: ?Tight and tender to palpation bilateral upper traps and suboccipital area.              ?  ?CERVICAL ROM:  ?  ?Active ROM A/PROM (deg) ?05/24/2021  ?Flexion WNL  ?Extension 25%  ?Right lateral flexion 50%  ?Left lateral flexion 50%  ?Right rotation 50%  ?Left rotation 50%  ? (Blank rows = not tested) ?  ?UE  ROM: ?  ?Limited UE flexion and abduction to approx 120 degrees ?  ?UE MMT: ?Weakness bilateral shoulder/rotator cuff: generally 4-/5 bilaterally ?  ?  ?  ?PATIENT SURVEYS:  ?FOTO 40 ?  ?TODAY'S TREATMENT:  ?06/06/21 ?UBE x 5' (2.5 min fwd and 2.5 min backward) PT present to discuss response to treatment and plan for today ?Standing bilateral shoulder extension with yellow tband x 20 ?Standing bilateral shoulder rows with yellow tband x 20 ?Seated bilateral shoulder ER with yellow tband x 20 ?Standing bilateral horizontal abduction with yellow tband x 20 ?Fwd bent shoulder extension and rows x 20 each ?Supine manual treatment (pillow + balance pad under pillow): STM bil upper traps, SO, scalenes, flexion/rotation P/ROM stretch ?Ice to right hip in sitting x 10 min ?05/30/21 ?Nustep L1 x 5' PT present to discuss response to treatment and plan for today ?Standing bilateral shoulder extension with yellow tband x 20 ?Standing bilateral shoulder rows with yellow tband x 20 ?Seated bilateral shoulder ER with yellow tband ?Standing bilateral horizontal abduction with yellow tband ?Shoulder rolls x 10 ?Wall push ups x 12 ?Supine manual treatment (pillow + balance pad under pillow): STM bil upper traps, SO, scalenes, flexion/rotation P/ROM stretch ? ? ?05/29/21 ?NuStep x 5 min (PT present to discuss HEP and progress along with assessing pain) ?Standing bilateral shoulder extension with yellow tband x 20 ?Standing bilateral shoulder rows with yellow tband x 20 ?Seated bilateral shoulder ER with yellow tband ?Seated bilateral horizontal abduction with yellow tband ?Instructed in adding these 4 new exercises to HEP and provided handouts ?Shoulder rolls x 10 ?Supine manual c spine passive ROM, STM to bilateral upper traps and cervical paraspinals, sub occipital release, bilateral upper trap and levator stretch  ? ?TODAY'S TREATMENT:  ?Initial eval completed and initiated HEP ?  ?  ?PATIENT EDUCATION:  ?Education details: Reviewed  HEP.   ?Person educated: Patient ?Education method: Explanation ?Education comprehension: verbalized understanding ?  ?  ?HOME EXERCISE PROGRAM: ?Access Code: [facility] ?URL: [email or web address] ?Date: 05/29/2021 ?Prepared by: Mikey Kirschner ? ?Exercises ?- Seated Scapular Retraction  - 1 x daily - 7 x weekly - 3 sets - 10 reps ?- Shoulder Rolls in Sitting  - 1 x daily - 7 x weekly - 3 sets - 10 reps ?- Seated Cervical Retraction  - 1 x daily - 7 x weekly - 3 sets - 10 reps ?- Seated Cervical Rotation AROM  - 1 x daily - 7 x weekly - 3 sets - 10 reps ?- Seated Cervical Sidebending AROM  - 1 x daily - 7 x weekly - 3 sets - 10 reps ?- Shoulder extension with resistance - Neutral  - 1 x daily - 7 x weekly -  3 sets - 10 reps ?- Standing Shoulder Row with Anchored Resistance  - 1 x daily - 7 x weekly - 3 sets - 10 reps ?- Seated Shoulder External Rotation AAROM with Pulley  - 1 x daily - 7 x weekly - 3 sets - 10 reps ?- Seated Shoulder Horizontal Abduction with Resistance  - 1 x daily - 7 x weekly - 3 sets - 10 reps ?  ?ASSESSMENT: ?  ?CLINICAL IMPRESSION: ?Bonita QuinLinda is responding well to cervical treatment. She demonstrates slight improvements in passive rotation and side bending.  She appears to have insufficient rotator cuff activity bilaterally which is likely contributing to her pain.  We will attempt to restore strength to the rotator cuff, however, patient has a difficult time getting into appropriate positions to isolate each muscle group.  She is having severe right hip pain.  We did some ice to this hip at end of session to see if this will offer relief.   ?  ?OBJECTIVE IMPAIRMENTS Abnormal gait, decreased mobility, decreased ROM, decreased strength, dizziness, hypomobility, increased fascial restrictions, increased muscle spasms, impaired flexibility, impaired UE functional use, postural dysfunction, and pain.  ?  ?ACTIVITY LIMITATIONS cleaning, community activity, driving, meal prep, laundry, yard  work, and shopping.  ?  ?PERSONAL FACTORS Fitness, Past/current experiences, Time since onset of injury/illness/exacerbation, and 1-2 comorbidities: depression, fibromyalgia  are also affecting patient's functi

## 2021-06-07 ENCOUNTER — Ambulatory Visit: Payer: Medicare PPO

## 2021-06-07 DIAGNOSIS — M6281 Muscle weakness (generalized): Secondary | ICD-10-CM

## 2021-06-07 DIAGNOSIS — R293 Abnormal posture: Secondary | ICD-10-CM | POA: Diagnosis not present

## 2021-06-07 DIAGNOSIS — R252 Cramp and spasm: Secondary | ICD-10-CM

## 2021-06-07 DIAGNOSIS — J301 Allergic rhinitis due to pollen: Secondary | ICD-10-CM | POA: Diagnosis not present

## 2021-06-07 DIAGNOSIS — M542 Cervicalgia: Secondary | ICD-10-CM

## 2021-06-07 NOTE — Therapy (Signed)
?OUTPATIENT PHYSICAL THERAPY TREATMENT NOTE ? ? ?Patient Name: [name] ?MRN: [id number] ?DOB:[birth year], 76 y.o., female ?Today's Date: 06/07/2021 ? ?PCP: Aretta Nip, MD ?REFERRING PROVIDER: Ward Givens, NP ? ?END OF SESSION:  ? PT End of Session - 06/07/21 1505   ? ? Visit Number 5   ? Date for PT Re-Evaluation 07/19/21   ? Authorization Type [employer]   ? Authorization Time Period [place]-Approved 12 vl-05/24/2021-07/19/2021-Auth#[id number]  [employer]   ? Authorization - Visit Number 5   ? Authorization - Number of Visits 12   ? Progress Note Due on Visit 10   ? PT Start Time [id number]   ? PT Stop Time 1530   ? PT Time Calculation (min) 45 min   ? Activity Tolerance Patient limited by pain   ? Behavior During Therapy [facility] for tasks assessed/performed   ? ?  ?  ? ?  ? ? ? ?Past Medical History:  ?Diagnosis Date  ? Chronic fatigue   ? Depression   ? Diabetes mellitus without complication ([place])   ? Early cataracts, bilateral   ? Fibromyalgia   ? Glaucoma   ? Headache   ? Vestibular migraines  ? History of bronchitis   ? History of pneumonia   ? Hypercholesterolemia   ? RA (rheumatoid arthritis) ([place])   ? RA (rheumatoid arthritis) ([place])   ? TIA (transient ischemic attack)   ? Vertigo   ? Wears glasses   ? ?Past Surgical History:  ?Procedure Laterality Date  ? APPENDECTOMY    ? BACK SURGERY  2012, 2017  ? BILATERAL CARPAL TUNNEL RELEASE    ? CESAREAN SECTION    ? x3  ? DILATION AND CURETTAGE OF UTERUS    ? GLAUCOMA SURGERY    ? KNEE ARTHROSCOPY Left   ? MOUTH SURGERY    ? TONSILLECTOMY    ? TOTAL HIP ARTHROPLASTY Right 01/30/2017  ? Procedure: RIGHT TOTAL HIP ARTHROPLASTY ANTERIOR APPROACH;  Surgeon: Mcarthur Rossetti, MD;  Location: [facility];  Service: Orthopedics;  Laterality: Right;  ? TOTAL HIP REVISION Right 07/28/2019  ? Procedure: TOTAL HIP REVISION, posterior approach femoal comp;  Surgeon: Rod Can, MD;  Location: [place];  Service: Orthopedics;  Laterality: Right;  ? ?Patient  Active Problem List  ? Diagnosis Date Noted  ? Acute bronchitis 03/23/2020  ? Elevated troponin 09/23/2019  ? 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 Enterococcal infection 08/24/2019  ? Rheumatoid arthritis ([place]) 08/24/2019  ? 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 Acute deep vein thrombosis (DVT) of right lower extremity ([place]) 04/01/2017  ? Epistaxis 03/14/2017  ? Status post total replacement of right hip 01/30/2017  ? Avascular necrosis of bone of hip, right ([place]) 12/18/2016  ? Trochanteric bursitis, right hip 11/04/2016  ? Pain of right hip joint 11/04/2016  ? S/P lumbar spinal fusion 07/21/2015  ? ? ?REFERRING DIAG:  M54.2 (ICD-10-CM) - Neck pain  ? ?THERAPY DIAG:  ?Cervicalgia ? ?Cramp and spasm ? ?Muscle weakness (generalized) ? ?Abnormal posture ? ?PERTINENT HISTORY: See above; fibromyalgia ? ?PRECAUTIONS: fall ? ?SUBJECTIVE: Patient states she is doing a little bit better.  The ice helped my hip and I tried to do this at home but my ice pack is small and not as cold.   ? ?PAIN:  ?Are you having pain? Yes: NPRS scale: 3/10 ?Pain location: neck and shoulders ?Pain description: aching ?Aggravating  factors: anything ?Relieving factors: meds ? ? ?OBJECTIVE: (objective measures completed at initial evaluation unless otherwise dated) ? ? ?OBJECTIVE:  ?  ?DIAGNOSTIC FINDINGS:  ?None in patient chart ?  ?PATIENT SURVEYS:  ?FOTO 40 ?  ?  ?COGNITION: ?Overall cognitive status: Within functional limits for tasks assessed ?  ?  ?SENSATION: ?WFL ?  ?POSTURE:  ?Rounded shoulders, fwd head, thoracic kyphosis, hips flexed, fwd trunk, knees flexed ?  ?PALPATION: ?Tight and tender to palpation bilateral upper traps and suboccipital area.              ?  ?CERVICAL ROM:  ?  ?Active ROM A/PROM (deg) ?05/24/2021  ?Flexion WNL  ?Extension 25%  ?Right lateral flexion 50%  ?Left lateral flexion 50%  ?Right rotation 50%  ?Left rotation 50%  ?  (Blank rows = not tested) ?  ?UE ROM: ?  ?Limited UE flexion and abduction to approx 120 degrees ?  ?UE MMT: ?Weakness bilateral shoulder/rotator cuff: generally 4-/5 bilaterally ?  ?  ?  ?PATIENT SURVEYS:  ?FOTO 40 ?  ?TODAY'S TREATMENT:  ?06/07/21 ?NuStep x 6 min level 5 PT present to discuss response to treatment and plan for today ?Seating bilateral shoulder rows with red tband x 20 ?Seated bilateral shoulder ER with red tband x 20 ?Standing bilateral horizontal abduction with red tband x 20 ?Fwd bent shoulder extension, horizontal abduction, and rows x 20 both ?Seated manual treatment:  STM bil upper traps, SO, addaday to bilateral upper traps and parascapular areas.   ?Ice to right hip in sitting x 10 min (during manual techniques) ?06/06/21 ?UBE x 5' (2.5 min fwd and 2.5 min backward) PT present to discuss response to treatment and plan for today ?Standing bilateral shoulder extension with yellow tband x 20 ?Standing bilateral shoulder rows with yellow tband x 20 ?Seated bilateral shoulder ER with yellow tband x 20 ?Standing bilateral horizontal abduction with yellow tband x 20 ?Fwd bent shoulder extension and rows x 20 each ?Supine manual treatment (pillow + balance pad under pillow): STM bil upper traps, SO, scalenes, flexion/rotation P/ROM stretch ?Ice to right hip in sitting x 10 min ?05/30/21 ?Nustep L1 x 5' PT present to discuss response to treatment and plan for today ?Standing bilateral shoulder extension with yellow tband x 20 ?Standing bilateral shoulder rows with yellow tband x 20 ?Seated bilateral shoulder ER with yellow tband ?Standing bilateral horizontal abduction with yellow tband ?Shoulder rolls x 10 ?Wall push ups x 12 ?Supine manual treatment (pillow + balance pad under pillow): STM bil upper traps, SO, scalenes, flexion/rotation P/ROM stretch ? ? ?  ?  ?PATIENT EDUCATION:  ?Education details: Reviewed HEP.   ?Person educated: Patient ?Education method: Explanation ?Education comprehension:  verbalized understanding ?  ?  ?HOME EXERCISE PROGRAM: ?Access Code: [facility] ?URL: [email or web address] ?Date: 05/29/2021 ?Prepared by: Candyce Churn ? ?Exercises ?- Seated Scapular Retraction  - 1 x daily - 7 x weekly - 3 sets - 10 reps ?- Shoulder Rolls in Sitting  - 1 x daily - 7 x weekly - 3 sets - 10 reps ?- Seated Cervical Retraction  - 1 x daily - 7 x weekly - 3 sets - 10 reps ?- Seated Cervical Rotation AROM  - 1 x daily - 7 x weekly - 3 sets - 10 reps ?- Seated Cervical Sidebending AROM  - 1 x daily - 7 x weekly - 3 sets - 10 reps ?- Shoulder extension with resistance - Neutral  - 1 x daily - 7  x weekly - 3 sets - 10 reps ?- Standing Shoulder Row with Anchored Resistance  - 1 x daily - 7 x weekly - 3 sets - 10 reps ?- Seated Shoulder External Rotation AAROM with Pulley  - 1 x daily - 7 x weekly - 3 sets - 10 reps ?- Seated Shoulder Horizontal Abduction with Resistance  - 1 x daily - 7 x weekly - 3 sets - 10 reps ?  ?ASSESSMENT: ?  ?CLINICAL IMPRESSION: ?Tionne responded well to ice to right hip and is using this at home.  She had no soreness or increased pain from isolated shoulder and scapular strengthening.  She has severe muscle tension and myofascial restriction right upper trap.  Addaday used today along with manual techniques to this area with success.  She continues to have suboccipital pain but this is also slightly improved.   ?  ?OBJECTIVE IMPAIRMENTS Abnormal gait, decreased mobility, decreased ROM, decreased strength, dizziness, hypomobility, increased fascial restrictions, increased muscle spasms, impaired flexibility, impaired UE functional use, postural dysfunction, and pain.  ?  ?ACTIVITY LIMITATIONS cleaning, community activity, driving, meal prep, laundry, yard work, and shopping.  ?  ?PERSONAL FACTORS Fitness, Past/current experiences, Time since onset of injury/illness/exacerbation, and 1-2 comorbidities: depression, fibromyalgia  are also affecting patient's functional  outcome.  ?  ?  ?REHAB POTENTIAL: Fair due to mutliple orthopedic issues and sleeping in recliner ?  ?CLINICAL DECISION MAKING: Evolving/moderate complexity ?  ?EVALUATION COMPLEXITY: Moderate ?  ?  ?[place]

## 2021-06-11 DIAGNOSIS — M7071 Other bursitis of hip, right hip: Secondary | ICD-10-CM | POA: Diagnosis not present

## 2021-06-11 DIAGNOSIS — M25551 Pain in right hip: Secondary | ICD-10-CM | POA: Diagnosis not present

## 2021-06-12 ENCOUNTER — Ambulatory Visit: Payer: Medicare PPO

## 2021-06-12 DIAGNOSIS — R293 Abnormal posture: Secondary | ICD-10-CM

## 2021-06-12 DIAGNOSIS — M542 Cervicalgia: Secondary | ICD-10-CM

## 2021-06-12 DIAGNOSIS — M6281 Muscle weakness (generalized): Secondary | ICD-10-CM

## 2021-06-12 DIAGNOSIS — R252 Cramp and spasm: Secondary | ICD-10-CM

## 2021-06-12 NOTE — Therapy (Signed)
?OUTPATIENT PHYSICAL THERAPY TREATMENT NOTE ? ? ?Patient Name: [name] ?MRN: [id number] ?DOB:1945/07/20, 76 y.o., female ?Today's Date: 06/12/2021 ? ?PCP: Aretta Nip, MD ?REFERRING PROVIDER: Aretta Nip, MD ? ?END OF SESSION:  ? PT End of Session - 06/12/21 1408   ? ? Visit Number 6   ? Date for PT Re-Evaluation 07/19/21   ? Authorization Type [employer]   ? Authorization Time Period [place]-Approved 12 vl-05/24/2021-07/19/2021-Auth#[id number]  [employer]   ? Authorization - Visit Number 6   ? Authorization - Number of Visits 12   ? Progress Note Due on Visit 10   ? PT Start Time [id number]   ? PT Stop Time [id number]   ? PT Time Calculation (min) 43 min   ? Activity Tolerance Patient limited by pain   ? Behavior During Therapy [facility] for tasks assessed/performed   ? ?  ?  ? ?  ? ? ? ?Past Medical History:  ?Diagnosis Date  ? Chronic fatigue   ? Depression   ? Diabetes mellitus without complication ([place])   ? Early cataracts, bilateral   ? Fibromyalgia   ? Glaucoma   ? Headache   ? Vestibular migraines  ? History of bronchitis   ? History of pneumonia   ? Hypercholesterolemia   ? RA (rheumatoid arthritis) ([place])   ? RA (rheumatoid arthritis) ([place])   ? TIA (transient ischemic attack)   ? Vertigo   ? Wears glasses   ? ?Past Surgical History:  ?Procedure Laterality Date  ? APPENDECTOMY    ? BACK SURGERY  2012, 2017  ? BILATERAL CARPAL TUNNEL RELEASE    ? CESAREAN SECTION    ? x3  ? DILATION AND CURETTAGE OF UTERUS    ? GLAUCOMA SURGERY    ? KNEE ARTHROSCOPY Left   ? MOUTH SURGERY    ? TONSILLECTOMY    ? TOTAL HIP ARTHROPLASTY Right 01/30/2017  ? Procedure: RIGHT TOTAL HIP ARTHROPLASTY ANTERIOR APPROACH;  Surgeon: Mcarthur Rossetti, MD;  Location: [facility];  Service: Orthopedics;  Laterality: Right;  ? TOTAL HIP REVISION Right 07/28/2019  ? Procedure: TOTAL HIP REVISION, posterior approach femoal comp;  Surgeon: Rod Can, MD;  Location: [place];  Service: Orthopedics;  Laterality: Right;   ? ?Patient Active Problem List  ? Diagnosis Date Noted  ? Acute bronchitis 03/23/2020  ? Elevated troponin 09/23/2019  ? 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 Enterococcal infection 08/24/2019  ? Rheumatoid arthritis ([place]) 08/24/2019  ? 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 Acute deep vein thrombosis (DVT) of right lower extremity ([place]) 04/01/2017  ? Epistaxis 03/14/2017  ? Status post total replacement of right hip 01/30/2017  ? Avascular necrosis of bone of hip, right ([place]) 12/18/2016  ? Trochanteric bursitis, right hip 11/04/2016  ? Pain of right hip joint 11/04/2016  ? S/P lumbar spinal fusion 07/21/2015  ? ? ?REFERRING DIAG:  M54.2 (ICD-10-CM) - Neck pain  ? ?THERAPY DIAG:  ?Cervicalgia ? ?Cramp and spasm ? ?Muscle weakness (generalized) ? ?Abnormal posture ? ?PERTINENT HISTORY: See above; fibromyalgia ? ?PRECAUTIONS: fall ? ?SUBJECTIVE: Patient states she is doing ok.  "I started my therapy for my hip at the other place and she wore me out.  My right shoulder feels pretty good but I still have pain at the base of my neck on the right" ?PAIN:  ?Are you having pain? Yes: NPRS scale: 3/10 ?  Pain location: neck and shoulders ?Pain description: aching ?Aggravating factors: anything ?Relieving factors: meds ? ? ?OBJECTIVE: (objective measures completed at initial evaluation unless otherwise dated) ? ? ?OBJECTIVE:  ?  ?DIAGNOSTIC FINDINGS:  ?None in patient chart ?  ?PATIENT SURVEYS:  ?FOTO 40 ?  ?  ?COGNITION: ?Overall cognitive status: Within functional limits for tasks assessed ?  ?  ?SENSATION: ?WFL ?  ?POSTURE:  ?Rounded shoulders, fwd head, thoracic kyphosis, hips flexed, fwd trunk, knees flexed ?  ?PALPATION: ?Tight and tender to palpation bilateral upper traps and suboccipital area.              ?  ?CERVICAL ROM:  ?  ?Active ROM A/PROM (deg) ?05/24/2021  ?Flexion WNL  ?Extension 25%  ?Right lateral flexion 50%   ?Left lateral flexion 50%  ?Right rotation 50%  ?Left rotation 50%  ? (Blank rows = not tested) ?  ?UE ROM: ?  ?Limited UE flexion and abduction to approx 120 degrees ?  ?UE MMT: ?Weakness bilateral shoulder/rotator cuff: generally 4-/5 bilaterally ?  ?  ?  ?PATIENT SURVEYS:  ?FOTO 40 ?  ?TODAY'S TREATMENT:  ?06/12/21 ?UBE x 5 min (2.5 min fwd and 2.5 min backward) ?Fwd bent shoulder extension, horizontal abduction, and rows x 20 both with 2 lb ?Seating bilateral shoulder rows with red tband x 20 ?Seated bilateral shoulder ER with red tband x 20 ?Seated bilateral horizontal abduction with red tband x 20 ?Standing 4 D ball rolls both x 20 each ?Standing 3 way scapular stabilization ?Seated manual treatment:  STM bil upper traps, SO, addaday to bilateral upper traps and parascapular areas.   ? ?06/07/21 ? ?NuStep x 6 min level 5 PT present to discuss response to treatment and plan for today ?Seating bilateral shoulder rows with red tband x 20 ?Seated bilateral shoulder ER with red tband x 20 ?Standing bilateral horizontal abduction with red tband x 20 ?Fwd bent shoulder extension, horizontal abduction, and rows x 20 both ?Seated manual treatment:  STM bil upper traps, SO, addaday to bilateral upper traps and parascapular areas.   ?Ice to right hip in sitting x 10 min (during manual techniques) ?06/06/21 ?UBE x 5' (2.5 min fwd and 2.5 min backward) PT present to discuss response to treatment and plan for today ?Standing bilateral shoulder extension with yellow tband x 20 ?Standing bilateral shoulder rows with yellow tband x 20 ?Seated bilateral shoulder ER with yellow tband x 20 ?Standing bilateral horizontal abduction with yellow tband x 20 ?Fwd bent shoulder extension and rows x 20 each ?Supine manual treatment (pillow + balance pad under pillow): STM bil upper traps, SO, scalenes, flexion/rotation P/ROM stretch ?Ice to right hip in sitting x 10 min ?05/30/21 ?Nustep L1 x 5' PT present to discuss response to treatment and  plan for today ?Standing bilateral shoulder extension with yellow tband x 20 ?Standing bilateral shoulder rows with yellow tband x 20 ?Seated bilateral shoulder ER with yellow tband ?Standing bilateral horizontal abduction with yellow tband ?Shoulder rolls x 10 ?Wall push ups x 12 ?Supine manual treatment (pillow + balance pad under pillow): STM bil upper traps, SO, scalenes, flexion/rotation P/ROM stretch ? ? ?  ?  ?PATIENT EDUCATION:  ?Education details: Reviewed HEP.   ?Person educated: Patient ?Education method: Explanation ?Education comprehension: verbalized understanding ?  ?  ?HOME EXERCISE PROGRAM: ?Access Code: [facility] ?URL: [email or web address] ?Date: 05/29/2021 ?Prepared by: Candyce Churn ? ?Exercises ?- Seated Scapular Retraction  - 1 x daily - 7 x weekly - 3  sets - 10 reps ?- Shoulder Rolls in Sitting  - 1 x daily - 7 x weekly - 3 sets - 10 reps ?- Seated Cervical Retraction  - 1 x daily - 7 x weekly - 3 sets - 10 reps ?- Seated Cervical Rotation AROM  - 1 x daily - 7 x weekly - 3 sets - 10 reps ?- Seated Cervical Sidebending AROM  - 1 x daily - 7 x weekly - 3 sets - 10 reps ?- Shoulder extension with resistance - Neutral  - 1 x daily - 7 x weekly - 3 sets - 10 reps ?- Standing Shoulder Row with Anchored Resistance  - 1 x daily - 7 x weekly - 3 sets - 10 reps ?- Seated Shoulder External Rotation AAROM with Pulley  - 1 x daily - 7 x weekly - 3 sets - 10 reps ?- Seated Shoulder Horizontal Abduction with Resistance  - 1 x daily - 7 x weekly - 3 sets - 10 reps ?  ?ASSESSMENT: ?  ?CLINICAL IMPRESSION: ?Chamara responded well to bilateral scapular stabilization exercises.  She has severe postural issues that slow her progress but she is very willing to try various approaches to improving her condition.   She continues to have suboccipital pain but this is also slightly improved.  She would benefit from continued PT for postural strengthening and c spine ROM.   ?  ?OBJECTIVE IMPAIRMENTS  Abnormal gait, decreased mobility, decreased ROM, decreased strength, dizziness, hypomobility, increased fascial restrictions, increased muscle spasms, impaired flexibility, impaired UE functional use, postural dysfunc

## 2021-06-13 ENCOUNTER — Other Ambulatory Visit: Payer: Medicare PPO

## 2021-06-15 DIAGNOSIS — J301 Allergic rhinitis due to pollen: Secondary | ICD-10-CM | POA: Diagnosis not present

## 2021-06-18 DIAGNOSIS — R030 Elevated blood-pressure reading, without diagnosis of hypertension: Secondary | ICD-10-CM | POA: Diagnosis not present

## 2021-06-18 DIAGNOSIS — E669 Obesity, unspecified: Secondary | ICD-10-CM | POA: Diagnosis not present

## 2021-06-18 DIAGNOSIS — G894 Chronic pain syndrome: Secondary | ICD-10-CM | POA: Diagnosis not present

## 2021-06-18 DIAGNOSIS — M2550 Pain in unspecified joint: Secondary | ICD-10-CM | POA: Diagnosis not present

## 2021-06-18 DIAGNOSIS — Z79899 Other long term (current) drug therapy: Secondary | ICD-10-CM | POA: Diagnosis not present

## 2021-06-18 DIAGNOSIS — E119 Type 2 diabetes mellitus without complications: Secondary | ICD-10-CM | POA: Diagnosis not present

## 2021-06-18 NOTE — Progress Notes (Signed)
?Cardiology Office Note:   ? ?Date:  06/20/2021  ? ?ID:  [name], DOB July 09, 1945, MRN [id number] ? ?PCP:  Aretta Nip, MD  ?Cardiologist:  Sinclair Grooms, MD  ? ?Referring MD: Aretta Nip, MD  ? ?Chief Complaint  ?Patient presents with  ? Hypertension  ? Hyperlipidemia  ? Coronary Artery Disease  ? ? ?History of Present Illness:   ? ?[name] is a 75 y.o. female with a hx of new systolic ejection murmur and DOE, asymptomatic/undiagnosed coronary artery disease based on CT coronary artery and aortic atherosclerosis.  Other problems are DM II, TIA, estrogen deficiency, rheumatoid arthritis, fibromyalgia, spinal stenosis, statin intolerance, and hyperlipidemia. ? ? ?Overall she is doing okay.  She has a lot of questions about her condition.  We talked about diastolic dysfunction in terms that she could understand.  She has started Jardiance and having no side effects.  She cannot tell if there is any improvement in her breathing.  She denies orthopnea and PND.  She has mild lower extremity swelling. ? ?Laboratory data done after starting Jardiance were okay. ? ?Past Medical History:  ?Diagnosis Date  ? Chronic fatigue   ? Depression   ? Diabetes mellitus without complication ([place])   ? Early cataracts, bilateral   ? Fibromyalgia   ? Glaucoma   ? Headache   ? Vestibular migraines  ? History of bronchitis   ? History of pneumonia   ? Hypercholesterolemia   ? RA (rheumatoid arthritis) ([place])   ? RA (rheumatoid arthritis) ([place])   ? TIA (transient ischemic attack)   ? Vertigo   ? Wears glasses   ? ? ?Past Surgical History:  ?Procedure Laterality Date  ? APPENDECTOMY    ? BACK SURGERY  2012, 2017  ? BILATERAL CARPAL TUNNEL RELEASE    ? CESAREAN SECTION    ? x3  ? DILATION AND CURETTAGE OF UTERUS    ? GLAUCOMA SURGERY    ? KNEE ARTHROSCOPY Left   ? MOUTH SURGERY    ? TONSILLECTOMY    ? TOTAL HIP ARTHROPLASTY Right 01/30/2017  ? Procedure: RIGHT TOTAL HIP ARTHROPLASTY ANTERIOR APPROACH;   Surgeon: Mcarthur Rossetti, MD;  Location: [facility];  Service: Orthopedics;  Laterality: Right;  ? TOTAL HIP REVISION Right 07/28/2019  ? Procedure: TOTAL HIP REVISION, posterior approach femoal comp;  Surgeon: Rod Can, MD;  Location: [facility];  Service: Orthopedics;  Laterality: Right;  ? ? ?Current Medications: ?Current Meds  ?Medication Sig  ? ACCU-CHEK GUIDE test strip as needed.  ? Accu-Chek Softclix Lancets lancets SMARTSIG:Topical  ? acetaminophen (TYLENOL) 325 MG tablet Take 2 tablets (650 mg total) by mouth every 6 (six) hours as needed for mild pain (pain score 1-3 or temp > 100.5).  ? amoxicillin (AMOXIL) 500 MG capsule Take 1 capsule (500 mg total) by mouth 2 (two) times daily.  ? cetirizine (ZYRTEC) 10 MG tablet Take 10 mg by mouth daily.   ? Cholecalciferol (VITAMIN D) 50 MCG (2000 UT) tablet Take 2,000 Units by mouth daily.   ? docusate sodium (COLACE) 100 MG capsule Take 1 capsule (100 mg total) by mouth 2 (two) times daily.  ? DULoxetine (CYMBALTA) 30 MG capsule Take 60 mg by mouth daily.   ? empagliflozin (JARDIANCE) 10 MG TABS tablet Take 1 tablet (10 mg total) by mouth daily before breakfast.  ? EPIPEN 2-PAK 0.3 MG/0.3ML SOAJ injection Inject 0.3 mg into the muscle daily as needed. For anaphylactic allergic  reaction  ? ezetimibe (ZETIA) 10 MG tablet Take 10 mg by mouth daily.  ? fenofibrate 54 MG tablet Take 54 mg by mouth daily.  ? folic acid (FOLVITE) 1 MG tablet Take 1 mg by mouth daily.  ? HYDROcodone-acetaminophen (NORCO) 7.5-325 MG tablet Take 1-4 tablets by mouth every 6 (six) hours as needed for moderate pain.  ? hydrOXYzine (ATARAX/VISTARIL) 25 MG tablet Take 25 mg by mouth daily.  ? latanoprost (XALATAN) 0.005 % ophthalmic solution Place 1 drop into both eyes at bedtime.   ? methotrexate 250 MG/10ML injection methotrexate sodium 25 mg/mL injection solution  ? NON FORMULARY Inject 1 Syringe into the skin 2 (two) times a week. Allergy Shots  ? ondansetron (ZOFRAN) 4 MG tablet  Take 1 tablet (4 mg total) by mouth every 6 (six) hours as needed for nausea.  ? pioglitazone (ACTOS) 30 MG tablet Take 30 mg by mouth daily.  ? rosuvastatin (CRESTOR) 5 MG tablet 5 mg. Take Monday, Wednesday ,friday  ? Tocilizumab (ACTEMRA) 200 MG/10ML SOLN   ? topiramate (TOPAMAX) 50 MG tablet Take 1 tablet (50 mg total) by mouth at bedtime.  ?  ? ?Allergies:   Sulfa antibiotics  ? ?Social History  ? ?Socioeconomic History  ? Marital status: Widowed  ?  Spouse name: Not on file  ? Number of children: 4  ? Years of education: 36  ? Highest education level: Not on file  ?Occupational History  ? Occupation: Retired  ?  Comment: Teacher  ?Tobacco Use  ? Smoking status: Never  ? Smokeless tobacco: Never  ?Vaping Use  ? Vaping Use: Never used  ?Substance and Sexual Activity  ? Alcohol use: No  ? Drug use: No  ? Sexual activity: Not on file  ?Other Topics Concern  ? Not on file  ?Social History Narrative  ? Lives at home alone  ? Right-handed  ? Caffeine: tea daily  ? Has 10 grandchildren  ? ?Social Determinants of Health  ? ?Financial Resource Strain: Not on file  ?Food Insecurity: Not on file  ?Transportation Needs: Not on file  ?Physical Activity: Not on file  ?Stress: Not on file  ?Social Connections: Not on file  ?  ? ?Family History: ?The patient's family history includes Diabetes in her father and mother; Heart disease in her father, mother, and sister; High blood pressure in her father; Stroke in her father and mother. There is no history of Migraines. ? ?ROS:   ?Please see the history of present illness.    ?She still has occasional dizziness with sudden movements.  She has bursitis in the right hip.  All other systems reviewed and are negative. ? ?EKGs/Labs/Other Studies Reviewed:   ? ?The following studies were reviewed today: ? ?2 D Doppler echocardiogram 05/14/2021: ?IMPRESSIONS  ? 1. Left ventricular ejection fraction, by estimation, is 60 to 65%. The  ?left ventricle has normal function. The left ventricle  has no regional  ?wall motion abnormalities. There is mild left ventricular hypertrophy of  ?the basal-septal segment. Left  ?ventricular diastolic parameters are consistent with Grade I diastolic  ?dysfunction (impaired relaxation).  ? 2. Right ventricular systolic function is normal. The right ventricular  ?size is mildly enlarged. There is normal pulmonary artery systolic  ?pressure.  ? 3. Left atrial size was severely dilated.  ? 4. No evidence of mitral valve regurgitation.  ? 5. The aortic valve was not well visualized. Aortic valve regurgitation  ?is not visualized.  ? 6. The inferior vena  cava is normal in size with greater than 50%  ?respiratory variability, suggesting right atrial pressure of 3 mmHg.  ? ?Comparison(s): No prior Echocardiogram.  ? ?Conclusion(s)/Recommendation(s): Challenging windows.  ? ?CHEST CT SCAN 04/2021: ?IMPRESSION: ?1. Negative examination for pulmonary embolism. ?2. Mild, diffusely mosaic attenuation of the airspaces, suggesting ?small airways disease. ?3. Cardiomegaly and coronary artery disease. ?  ?Aortic Atherosclerosis (ICD10-I70.0). ?  ? ?EKG:  EKG no new data ? ?Recent Labs: ?No results found for requested labs within last 8760 hours.  ?Recent Lipid Panel ?   ?Component Value Date/Time  ? CHOL (H) 02/25/2007 0520  ?  217        ?ATP III CLASSIFICATION: ? <200     mg/dL   Desirable ? 200-239  mg/dL   Borderline High ? >=240    mg/dL   High  ? TRIG 144 02/25/2007 0520  ? HDL 33 (L) 02/25/2007 0520  ? CHOLHDL 6.6 02/25/2007 0520  ? VLDL 29 02/25/2007 0520  ? [place] (H) 02/25/2007 0520  ?  155        ?Total Cholesterol/HDL:CHD Risk ?Coronary Heart Disease Risk Table ?                    Men   Women ? 1/2 Average Risk   3.4   3.3  ? ? ?Physical Exam:   ? ?VS:  BP 132/60   Pulse 73   Ht 4\' 11"  (1.499 m)   Wt 193 lb 6.4 oz (87.7 kg)   SpO2 95%   BMI 39.06 kg/m?    ? ?Wt Readings from Last 3 Encounters:  ?06/20/21 193 lb 6.4 oz (87.7 kg)  ?05/09/21 186 lb (84.4 kg)  ?04/25/21  194 lb 6.4 oz (88.2 kg)  ?  ? ?GEN: Morbidly obese. No acute distress ?HEENT: Normal ?NECK: No JVD. ?LYMPHATICS: No lymphadenopathy ?CARDIAC: No murmur. RRR no gallop, or edema. ?VASCULAR:  Normal Pulses. No

## 2021-06-19 ENCOUNTER — Ambulatory Visit: Payer: Medicare PPO

## 2021-06-19 DIAGNOSIS — M6281 Muscle weakness (generalized): Secondary | ICD-10-CM

## 2021-06-19 DIAGNOSIS — R293 Abnormal posture: Secondary | ICD-10-CM | POA: Diagnosis not present

## 2021-06-19 DIAGNOSIS — M542 Cervicalgia: Secondary | ICD-10-CM

## 2021-06-19 DIAGNOSIS — R252 Cramp and spasm: Secondary | ICD-10-CM | POA: Diagnosis not present

## 2021-06-19 NOTE — Therapy (Signed)
?OUTPATIENT PHYSICAL THERAPY TREATMENT NOTE ? ? ?Patient Name: [name] ?MRN: [id number] ?DOB:[birth year], 76 y.o., female ?Today's Date: 06/19/2021 ? ?PCP: Clayborn Heron, MD ?REFERRING PROVIDER: Clayborn Heron, MD ? ?END OF SESSION:  ? PT End of Session - 06/19/21 1506   ? ? Visit Number 7   ? Date for PT Re-Evaluation 07/19/21   ? Authorization Type [employer]   ? Authorization Time Period [place]-Approved 12 vl-05/24/2021-07/19/2021-Auth#[id number]  [employer]   ? Authorization - Visit Number 7   ? Authorization - Number of Visits 12   ? Progress Note Due on Visit 10   ? PT Start Time 1445   ? PT Stop Time 1530   ? PT Time Calculation (min) 45 min   ? Activity Tolerance Patient limited by pain   ? Behavior During Therapy [facility] for tasks assessed/performed   ? ?  ?  ? ?  ? ? ? ?Past Medical History:  ?Diagnosis Date  ? Chronic fatigue   ? Depression   ? Diabetes mellitus without complication ([facility])   ? Early cataracts, bilateral   ? Fibromyalgia   ? Glaucoma   ? Headache   ? Vestibular migraines  ? History of bronchitis   ? History of pneumonia   ? Hypercholesterolemia   ? RA (rheumatoid arthritis) ([facility])   ? RA (rheumatoid arthritis) ([facility])   ? TIA (transient ischemic attack)   ? Vertigo   ? Wears glasses   ? ?Past Surgical History:  ?Procedure Laterality Date  ? APPENDECTOMY    ? BACK SURGERY  2012, 2017  ? BILATERAL CARPAL TUNNEL RELEASE    ? CESAREAN SECTION    ? x3  ? DILATION AND CURETTAGE OF UTERUS    ? GLAUCOMA SURGERY    ? KNEE ARTHROSCOPY Left   ? MOUTH SURGERY    ? TONSILLECTOMY    ? TOTAL HIP ARTHROPLASTY Right 01/30/2017  ? Procedure: RIGHT TOTAL HIP ARTHROPLASTY ANTERIOR APPROACH;  Surgeon: Kathryne Hitch, MD;  Location: [facility];  Service: Orthopedics;  Laterality: Right;  ? TOTAL HIP REVISION Right 07/28/2019  ? Procedure: TOTAL HIP REVISION, posterior approach femoal comp;  Surgeon: Samson Frederic, MD;  Location: [facility];  Service: Orthopedics;  Laterality: Right;   ? ?Patient Active Problem List  ? Diagnosis Date Noted  ? Acute bronchitis 03/23/2020  ? Elevated troponin 09/23/2019  ? 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 Enterococcal infection 08/24/2019  ? Rheumatoid arthritis ([facility]) 08/24/2019  ? 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 Acute deep vein thrombosis (DVT) of right lower extremity ([facility]) 04/01/2017  ? Epistaxis 03/14/2017  ? Status post total replacement of right hip 01/30/2017  ? Avascular necrosis of bone of hip, right ([facility]CC) 12/18/2016  ? Trochanteric bursitis, right hip 11/04/2016  ? Pain of right hip joint 11/04/2016  ? S/P lumbar spinal fusion 07/21/2015  ? ? ?REFERRING DIAG:  M54.2 (ICD-10-CM) - Neck pain  ? ?THERAPY DIAG:  ?Cervicalgia ? ?Cramp and spasm ? ?Muscle weakness (generalized) ? ?Abnormal posture ? ?PERTINENT HISTORY: See above; fibromyalgia ? ?PRECAUTIONS: fall ? ?SUBJECTIVE: Patient states she is doing ok, she states she is able to turn her head to the right a little more to look for oncoming traffic.  She admits, however, that she continues to experience pain at the sub occipital area and cranial area.   ?PAIN:  ?Are you having pain? Yes: NPRS  scale: 3/10 ?Pain location: neck and shoulders ?Pain description: aching ?Aggravating factors: anything ?Relieving factors: meds ? ? ?OBJECTIVE: (objective measures completed at initial evaluation unless otherwise dated) ? ? ?OBJECTIVE:  ?  ?DIAGNOSTIC FINDINGS:  ?None in patient chart ?  ?PATIENT SURVEYS:  ?FOTO 40 ?  ?  ?COGNITION: ?Overall cognitive status: Within functional limits for tasks assessed ?  ?  ?SENSATION: ?WFL ?  ?POSTURE:  ?Rounded shoulders, fwd head, thoracic kyphosis, hips flexed, fwd trunk, knees flexed ?  ?PALPATION: ?Tight and tender to palpation bilateral upper traps and suboccipital area.              ?  ?CERVICAL ROM:  ?  ?Active ROM A/PROM (deg) ?05/24/2021  ?Flexion WNL  ?Extension 25%   ?Right lateral flexion 50%  ?Left lateral flexion 50%  ?Right rotation 50%  ?Left rotation 50%  ? (Blank rows = not tested) ?  ?UE ROM: ?  ?Limited UE flexion and abduction to approx 120 degrees ?  ?UE MMT: ?Weakness bilateral shoulder/rotator cuff: generally 4-/5 bilaterally ?  ?  ?  ?PATIENT SURVEYS:  ?FOTO 40 ?  ?TODAY'S TREATMENT:  ?06/19/21 ?UBE x 9 min (4.66min fwd and 4.5 min backward) ?Standing 4 D ball rolls both x 20 each ?Shoulder extension, rows, bilateral ER and horizontal abduction x 20 each (yellow) ?Fwd bent shoulder extension, horizontal abduction, and rows x 20 both with 2 lb ?Shoulder rolls x 20 ?Supine cervical retraction x 5 ?Seated manual treatment:  STM bil upper traps, SO, addaday to bilateral upper traps and parascapular areas.  ?06/12/21 ?UBE x 5 min (2.5 min fwd and 2.5 min backward) ?Fwd bent shoulder extension, horizontal abduction, and rows x 20 both with 2 lb ?Seating bilateral shoulder rows with red tband x 20 ?Seated bilateral shoulder ER with red tband x 20 ?Seated bilateral horizontal abduction with red tband x 20 ?Standing 4 D ball rolls both x 20 each ?Standing 3 way scapular stabilization ?Seated manual treatment:  STM bil upper traps, SO, addaday to bilateral upper traps and parascapular areas.   ? ?06/07/21 ? ?NuStep x 6 min level 5 PT present to discuss response to treatment and plan for today ?Seating bilateral shoulder rows with red tband x 20 ?Seated bilateral shoulder ER with red tband x 20 ?Standing bilateral horizontal abduction with red tband x 20 ?Fwd bent shoulder extension, horizontal abduction, and rows x 20 both ?Seated manual treatment:  STM bil upper traps, SO, addaday to bilateral upper traps and parascapular areas.   ?Ice to right hip in sitting x 10 min (during manual techniques) ? ? ? ?  ?  ?PATIENT EDUCATION:  ?Education details: Reviewed HEP.   ?Person educated: Patient ?Education method: Explanation ?Education comprehension: verbalized understanding ?  ?   ?HOME EXERCISE PROGRAM: ?Access Code: [facility] ?URL: [email or web address] ?Date: 05/29/2021 ?Prepared by: Mikey Kirschner ? ?Exercises ?- Seated Scapular Retraction  - 1 x daily - 7 x weekly - 3 sets - 10 reps ?- Shoulder Rolls in Sitting  - 1 x daily - 7 x weekly - 3 sets - 10 reps ?- Seated Cervical Retraction  - 1 x daily - 7 x weekly - 3 sets - 10 reps ?- Seated Cervical Rotation AROM  - 1 x daily - 7 x weekly - 3 sets - 10 reps ?- Seated Cervical Sidebending AROM  - 1 x daily - 7 x weekly - 3 sets - 10 reps ?- Shoulder extension with resistance - Neutral  - 1  x daily - 7 x weekly - 3 sets - 10 reps ?- Standing Shoulder Row with Anchored Resistance  - 1 x daily - 7 x weekly - 3 sets - 10 reps ?- Seated Shoulder External Rotation AAROM with Pulley  - 1 x daily - 7 x weekly - 3 sets - 10 reps ?- Seated Shoulder Horizontal Abduction with Resistance  - 1 x daily - 7 x weekly - 3 sets - 10 reps ?  ?ASSESSMENT: ?  ?CLINICAL IMPRESSION: ?Kaya is able to rotate slightly more on right to look for oncoming traffic per her report but she continues to be tender at the suboccipital area.  She did mention that she felt well enough to work outside in her yard.   She would benefit from continued PT for postural strengthening and c spine ROM.   ?  ?OBJECTIVE IMPAIRMENTS Abnormal gait, decreased mobility, decreased ROM, decreased strength, dizziness, hypomobility, increased fascial restrictions, increased muscle spasms, impaired flexibility, impaired UE functional use, postural dysfunction, and pain.  ?  ?ACTIVITY LIMITATIONS cleaning, community activity, driving, meal prep, laundry, yard work, and shopping.  ?  ?PERSONAL FACTORS Fitness, Past/current experiences, Time since onset of injury/illness/exacerbation, and 1-2 comorbidities: depression, fibromyalgia  are also affecting patient's functional outcome.  ?  ?  ?REHAB POTENTIAL: Fair due to mutliple orthopedic issues and sleeping in recliner ?  ?CLINICAL  DECISION MAKING: Evolving/moderate complexity ?  ?EVALUATION COMPLEXITY: Moderate ?  ?  ?GOALS: ?Goals reviewed with patient? Yes ?  ?SHORT TERM GOALS: [place] date: 06/21/2021 ?  ?Patient will be independent with ini

## 2021-06-20 ENCOUNTER — Other Ambulatory Visit: Payer: Medicare PPO

## 2021-06-20 ENCOUNTER — Ambulatory Visit: Payer: Medicare PPO | Admitting: Interventional Cardiology

## 2021-06-20 ENCOUNTER — Encounter: Payer: Self-pay | Admitting: Interventional Cardiology

## 2021-06-20 VITALS — BP 132/60 | HR 73 | Ht 59.0 in | Wt 193.4 lb

## 2021-06-20 DIAGNOSIS — I700 Atherosclerosis of aorta: Secondary | ICD-10-CM

## 2021-06-20 DIAGNOSIS — I444 Left anterior fascicular block: Secondary | ICD-10-CM

## 2021-06-20 DIAGNOSIS — Z79899 Other long term (current) drug therapy: Secondary | ICD-10-CM

## 2021-06-20 DIAGNOSIS — M25551 Pain in right hip: Secondary | ICD-10-CM | POA: Diagnosis not present

## 2021-06-20 DIAGNOSIS — I2510 Atherosclerotic heart disease of native coronary artery without angina pectoris: Secondary | ICD-10-CM

## 2021-06-20 DIAGNOSIS — R011 Cardiac murmur, unspecified: Secondary | ICD-10-CM | POA: Diagnosis not present

## 2021-06-20 DIAGNOSIS — I5189 Other ill-defined heart diseases: Secondary | ICD-10-CM | POA: Diagnosis not present

## 2021-06-20 DIAGNOSIS — R0609 Other forms of dyspnea: Secondary | ICD-10-CM

## 2021-06-20 DIAGNOSIS — M7071 Other bursitis of hip, right hip: Secondary | ICD-10-CM | POA: Diagnosis not present

## 2021-06-20 LAB — LIPID PANEL
Chol/HDL Ratio: 2.5 ratio (ref 0.0–4.4)
Cholesterol, Total: 143 mg/dL (ref 100–199)
HDL: 57 mg/dL (ref >39)
LDL Chol Calc (NIH): 68 mg/dL (ref 0–99)
Triglycerides: 98 mg/dL (ref 0–149)
VLDL Cholesterol Cal: 18 mg/dL (ref 5–40)

## 2021-06-20 LAB — COMPREHENSIVE METABOLIC PANEL
ALT: 16 IU/L (ref 0–32)
AST: 25 IU/L (ref 0–40)
Albumin/Globulin Ratio: 1.7 (ref 1.2–2.2)
Albumin: 4.5 g/dL (ref 3.7–4.7)
Alkaline Phosphatase: 70 IU/L (ref 44–121)
BUN/Creatinine Ratio: 16 (ref 12–28)
BUN: 15 mg/dL (ref 8–27)
Bilirubin Total: 0.7 mg/dL (ref 0.0–1.2)
CO2: 26 mmol/L (ref 20–29)
Calcium: 10.3 mg/dL (ref 8.7–10.3)
Chloride: 100 mmol/L (ref 96–106)
Creatinine, Ser: 0.91 mg/dL (ref 0.57–1.00)
Globulin, Total: 2.6 g/dL (ref 1.5–4.5)
Glucose: 125 mg/dL — ABNORMAL HIGH (ref 70–99)
Potassium: 4.6 mmol/L (ref 3.5–5.2)
Sodium: 139 mmol/L (ref 134–144)
Total Protein: 7.1 g/dL (ref 6.0–8.5)
eGFR: 66 mL/min/{1.73_m2} (ref >59)

## 2021-06-20 NOTE — Patient Instructions (Signed)
Medication Instructions:  ?Your physician recommends that you continue on your current medications as directed. Please refer to the Current Medication list given to you today. ? ?*If you need a refill on your cardiac medications before your next appointment, please call your pharmacy* ? ?Lab Work: ?TODAY: Lipid panel, CMET ?If you have labs (blood work) drawn today and your tests are completely normal, you will receive your results only by: ?MyChart Message (if you have MyChart) OR ?A paper copy in the mail ?If you have any lab test that is abnormal or we need to change your treatment, we will call you to review the results. ? ?Testing/Procedures: ?NONE ? ?Follow-Up: ?At [facility], you and your health needs are our priority.  As part of our continuing mission to provide you with exceptional heart care, we have created designated Provider Care Teams.  These Care Teams include your primary Cardiologist (physician) and Advanced Practice Providers (APPs -  Physician Assistants and Nurse Practitioners) who all work together to provide you with the care you need, when you need it. ? ?Your next appointment:   ?3-4 month(s) ? ?The format for your next appointment:   ?In Person ? ?Provider:   ?Lesleigh Noe, MD { ? ? ?Important Information About Sugar ? ? ? ? ?  ?

## 2021-06-21 ENCOUNTER — Ambulatory Visit: Payer: Medicare PPO

## 2021-06-21 DIAGNOSIS — M542 Cervicalgia: Secondary | ICD-10-CM

## 2021-06-21 DIAGNOSIS — R252 Cramp and spasm: Secondary | ICD-10-CM | POA: Diagnosis not present

## 2021-06-21 DIAGNOSIS — J3089 Other allergic rhinitis: Secondary | ICD-10-CM | POA: Diagnosis not present

## 2021-06-21 DIAGNOSIS — J301 Allergic rhinitis due to pollen: Secondary | ICD-10-CM | POA: Diagnosis not present

## 2021-06-21 DIAGNOSIS — R293 Abnormal posture: Secondary | ICD-10-CM | POA: Diagnosis not present

## 2021-06-21 DIAGNOSIS — M6281 Muscle weakness (generalized): Secondary | ICD-10-CM

## 2021-06-21 DIAGNOSIS — J3081 Allergic rhinitis due to animal (cat) (dog) hair and dander: Secondary | ICD-10-CM | POA: Diagnosis not present

## 2021-06-21 NOTE — Therapy (Signed)
OUTPATIENT PHYSICAL THERAPY TREATMENT NOTE   Patient Name: [name] MRN: [id number] DOB:December 26, 1945, 76 y.o., female Today's Date: 06/21/2021  PCP: Clayborn Heron, MD REFERRING PROVIDER: Clayborn Heron, MD  END OF SESSION:   PT End of Session - 06/21/21 0953     Visit Number 8    Date for PT Re-Evaluation 07/19/21    Authorization Type [employer]    Authorization Time Period Cohere-Approved 12 vl-05/24/2021-07/19/2021-Auth#[id number]  [employer]    Authorization - Visit Number 8    Authorization - Number of Visits 12    Progress Note Due on Visit 10    PT Start Time [id number]    PT Stop Time 1020    PT Time Calculation (min) 42 min    Activity Tolerance Patient limited by pain    Behavior During Therapy WFL for tasks assessed/performed              Past Medical History:  Diagnosis Date   Chronic fatigue    Depression    Diabetes mellitus without complication (HCC)    Early cataracts, bilateral    Fibromyalgia    Glaucoma    Headache    Vestibular migraines   History of bronchitis    History of pneumonia    Hypercholesterolemia    RA (rheumatoid arthritis) (HCC)    RA (rheumatoid arthritis) (HCC)    TIA (transient ischemic attack)    Vertigo    Wears glasses    Past Surgical History:  Procedure Laterality Date   APPENDECTOMY     BACK SURGERY  2012, 2017   BILATERAL CARPAL TUNNEL RELEASE     CESAREAN SECTION     x3   DILATION AND CURETTAGE OF UTERUS     GLAUCOMA SURGERY     KNEE ARTHROSCOPY Left    MOUTH SURGERY     TONSILLECTOMY     TOTAL HIP ARTHROPLASTY Right 01/30/2017   Procedure: RIGHT TOTAL HIP ARTHROPLASTY ANTERIOR APPROACH;  Surgeon: Kathryne Hitch, MD;  Location: [facility];  Service: Orthopedics;  Laterality: Right;   TOTAL HIP REVISION Right 07/28/2019   Procedure: TOTAL HIP REVISION, posterior approach femoal comp;  Surgeon: Samson Frederic, MD;  Location: [facility];  Service: Orthopedics;  Laterality: Right;    Patient Active Problem List   Diagnosis Date Noted   Acute bronchitis 03/23/2020   Elevated troponin 09/23/2019   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) 08/24/2019   Enterococcal infection 08/24/2019   Rheumatoid arthritis (HCC) 08/24/2019   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) 01/13/2019   Acute deep vein thrombosis (DVT) of right lower extremity (HCC) 04/01/2017   Epistaxis 03/14/2017   Status post total replacement of right hip 01/30/2017   Avascular necrosis of bone of hip, right (HCC) 12/18/2016   Trochanteric bursitis, right hip 11/04/2016   Pain of right hip joint 11/04/2016   S/P lumbar spinal fusion 07/21/2015    REFERRING DIAG:  M54.2 (ICD-10-CM) - Neck pain   THERAPY DIAG:  Cervicalgia  Cramp and spasm  Muscle weakness (generalized)  Abnormal posture  PERTINENT HISTORY: See above; fibromyalgia  PRECAUTIONS: fall  SUBJECTIVE: Patient states she is having increased pain today.  She fell asleep on the couch with her head in an awkward position.  She continues to experience pain at the sub occipital area and cranial area.   PAIN:  Are you having pain? Yes: NPRS scale: 5/10 Pain location: neck and shoulders Pain  description: aching Aggravating factors: anything Relieving factors: meds   OBJECTIVE: (objective measures completed at initial evaluation unless otherwise dated)   OBJECTIVE:    DIAGNOSTIC FINDINGS:  None in patient chart   PATIENT SURVEYS:  FOTO 40     COGNITION: Overall cognitive status: Within functional limits for tasks assessed     SENSATION: WFL   POSTURE:  Rounded shoulders, fwd head, thoracic kyphosis, hips flexed, fwd trunk, knees flexed   PALPATION: Tight and tender to palpation bilateral upper traps and suboccipital area.                CERVICAL ROM:    Active ROM A/PROM (deg) 05/24/2021  Flexion WNL  Extension 25%  Right lateral flexion 50%  Left  lateral flexion 50%  Right rotation 50%  Left rotation 50%   (Blank rows = not tested)   UE ROM:   Limited UE flexion and abduction to approx 120 degrees   UE MMT: Weakness bilateral shoulder/rotator cuff: generally 4-/5 bilaterally       PATIENT SURVEYS:  FOTO 40   TODAY'S TREATMENT:  06/19/21 UBE x 9 min (4.11min fwd and 4.5 min backward) Shoulder extension, rows, bilateral ER and horizontal abduction x 20 each (red) Moist heat with interferential e stim to bilateral cervical paraspinals and upper traps  06/12/21 UBE x 5 min (2.5 min fwd and 2.5 min backward) Fwd bent shoulder extension, horizontal abduction, and rows x 20 both with 2 lb Seating bilateral shoulder rows with red tband x 20 Seated bilateral shoulder ER with red tband x 20 Seated bilateral horizontal abduction with red tband x 20 Standing 4 D ball rolls both x 20 each Standing 3 way scapular stabilization Seated manual treatment:  STM bil upper traps, SO, addaday to bilateral upper traps and parascapular areas.    06/07/21  NuStep x 6 min level 5 PT present to discuss response to treatment and plan for today Seating bilateral shoulder rows with red tband x 20 Seated bilateral shoulder ER with red tband x 20 Standing bilateral horizontal abduction with red tband x 20 Fwd bent shoulder extension, horizontal abduction, and rows x 20 both Seated manual treatment:  STM bil upper traps, SO, addaday to bilateral upper traps and parascapular areas.   Ice to right hip in sitting x 10 min (during manual techniques)        PATIENT EDUCATION:  Education details: Reviewed HEP.   Person educated: Patient Education method: Explanation Education comprehension: verbalized understanding     HOME EXERCISE PROGRAM: Access Code: [facility] URL: [email or web address] Date: 05/29/2021 Prepared by: Mikey Kirschner  Exercises - Seated Scapular Retraction  - 1 x daily - 7 x weekly - 3 sets - 10 reps - Shoulder  Rolls in Sitting  - 1 x daily - 7 x weekly - 3 sets - 10 reps - Seated Cervical Retraction  - 1 x daily - 7 x weekly - 3 sets - 10 reps - Seated Cervical Rotation AROM  - 1 x daily - 7 x weekly - 3 sets - 10 reps - Seated Cervical Sidebending AROM  - 1 x daily - 7 x weekly - 3 sets - 10 reps - Shoulder extension with resistance - Neutral  - 1 x daily - 7 x weekly - 3 sets - 10 reps - Standing Shoulder Row with Anchored Resistance  - 1 x daily - 7 x weekly - 3 sets - 10 reps - Seated Shoulder External Rotation AAROM with Pulley  -  1 x daily - 7 x weekly - 3 sets - 10 reps - Seated Shoulder Horizontal Abduction with Resistance  - 1 x daily - 7 x weekly - 3 sets - 10 reps   ASSESSMENT:   CLINICAL IMPRESSION: Caprina had somewhat of a set back due to falling asleep on couch with her head in an awkward position. She was able to do the postural exercises but was having a lot of suboccipital pain. We focused on pain control today.  She responded well to moist heat and estim, reporting decreased pain post treatment.   She would benefit from continued PT for postural strengthening and c spine ROM.     OBJECTIVE IMPAIRMENTS Abnormal gait, decreased mobility, decreased ROM, decreased strength, dizziness, hypomobility, increased fascial restrictions, increased muscle spasms, impaired flexibility, impaired UE functional use, postural dysfunction, and pain.    ACTIVITY LIMITATIONS cleaning, community activity, driving, meal prep, laundry, yard work, and shopping.    PERSONAL FACTORS Fitness, Past/current experiences, Time since onset of injury/illness/exacerbation, and 1-2 comorbidities: depression, fibromyalgia  are also affecting patient's functional outcome.      REHAB POTENTIAL: Fair due to mutliple orthopedic issues and sleeping in recliner   CLINICAL DECISION MAKING: Evolving/moderate complexity   EVALUATION COMPLEXITY: Moderate     GOALS: Goals reviewed with patient? Yes   SHORT TERM GOALS:  Target date: 06/21/2021   Patient will be independent with initial HEP  Baseline: na Goal status: INITIAL   2.  Pain report to be no greater than 4/10  Baseline: na Goal status: INITIAL     LONG TERM GOALS: Target date: 07/19/2021    Patient to be independent with advanced HEP  Baseline: na Goal status: INITIAL   2.  Patient to report pain no greater than 2/10  Baseline: na Goal status: INITIAL   3.  FOTO to be 54 Baseline: 40 Goal status: INITIAL   4.  Patient to report 50% improvement in overall symptoms Baseline: na Goal status: INITIAL   5.  Patient to be able to rotate C spine safely to look for oncoming traffic and back safely when driving Baseline: na Goal status: INITIAL   6.  Patient to be able to sleep in bed. Baseline: na Goal status: INITIAL     PLAN: PT FREQUENCY: 2x/week   PT DURATION: 8 weeks   PLANNED INTERVENTIONS: Therapeutic exercises, Therapeutic activity, Neuromuscular re-education, Balance training, Gait training, Patient/Family education, Joint mobilization, Vestibular training, Canalith repositioning, DME instructions, Aquatic Therapy, Dry Needling, Electrical stimulation, Spinal mobilization, Cryotherapy, Moist heat, Taping, Traction, Ultrasound, Ionotophoresis /ml Dexamethasone, and Manual therapy   PLAN FOR NEXT SESSION:  Continue bilateral rotator cuff strengthening, addaday to bilateral upper traps, progress manual C spine ROM and postural strengthening, add in rotator cuff strengthening and scapular stabilization to reduce over activity of upper trap and levator scapula which is likely causing her sub occipital pain.   Victorino Dike B. Sophia Cubero, PT 06/21/21 11:05 AM

## 2021-06-22 DIAGNOSIS — M7071 Other bursitis of hip, right hip: Secondary | ICD-10-CM | POA: Diagnosis not present

## 2021-06-22 DIAGNOSIS — M25551 Pain in right hip: Secondary | ICD-10-CM | POA: Diagnosis not present

## 2021-06-25 ENCOUNTER — Telehealth: Payer: Self-pay | Admitting: Interventional Cardiology

## 2021-06-25 DIAGNOSIS — R079 Chest pain, unspecified: Secondary | ICD-10-CM

## 2021-06-25 NOTE — Telephone Encounter (Signed)
Pt called into office today regarding chest pain that occurred yesterday.  She reports working out side doing yard work for 6 hours or more.  She was mowing, emptying "huge, very heavy grass bagger into garage bags,  planting flowers etc."  She took a shower when done and had a pain in the center of her chest that took her breath away.  She reports it lasted 5 or 6 minutes.  She denies any other s/s such as n/v, radiation, diaphoresis or shortness of breath.  She toweled off, took (2) asa and sat in her recliner to relax.  Today she reports having a sensation of soreness in that area.  Movement of arms does not cause any s/s.  Advised to continue to monitor signs and symptoms.  She will contact the office or call 911 if s/s reoccur.   Will sent to Dr Katrinka Blazing for his review and any further orders.

## 2021-06-25 NOTE — Telephone Encounter (Signed)
Pt c/o of Chest Pain: STAT if CP now or developed within 24 hours  1. Are you having CP right now?  Patient states her chest is sore from episode yesterday, but no major pain  2. Are you experiencing any other symptoms (ex. SOB, nausea, vomiting, sweating)?  Mild dizziness, but patient states she always has dizziness   3. How long have you been experiencing CP?  Since yesterday, 5/21  4. Is your CP continuous or coming and going?  Coming and going  5. Have you taken Nitroglycerin?  No, but patient states she took 2 Aspirin yesterday  Patient states yesterday she was doing yard work for about 6 hours and she went inside to shower and when she got out of the shower and was drying off she experienced a sharp pain in the center of her chest. She states it took her breath away and she was unable to do anything for a while. She states eventually she was able to go downstairs, still in pain. She took 2 Aspirin, which helped to relieve the pain, but today her chest is still sore. She states she has an appointment for physical therapy this afternoon and would like to know if she should still go.

## 2021-06-26 ENCOUNTER — Ambulatory Visit: Payer: Medicare PPO

## 2021-06-26 DIAGNOSIS — R293 Abnormal posture: Secondary | ICD-10-CM

## 2021-06-26 DIAGNOSIS — M542 Cervicalgia: Secondary | ICD-10-CM

## 2021-06-26 DIAGNOSIS — M6281 Muscle weakness (generalized): Secondary | ICD-10-CM | POA: Diagnosis not present

## 2021-06-26 DIAGNOSIS — R252 Cramp and spasm: Secondary | ICD-10-CM | POA: Diagnosis not present

## 2021-06-26 NOTE — Therapy (Signed)
OUTPATIENT PHYSICAL THERAPY TREATMENT NOTE   Patient Name: [name] MRN: [id number] DOB:[birth year], 76 y.o., female Today's Date: 06/26/2021  PCP: Clayborn Heronankins, Victoria R, MD REFERRING PROVIDER: Butch PennyMillikan, Megan, NP  END OF SESSION:   PT End of Session - 06/26/21 1502     Visit Number 9    Date for PT Re-Evaluation 07/19/21    Authorization Type [employer]    Authorization Time Period Cohere-Approved 12 vl-05/24/2021-07/19/2021-Auth#[id number]  [employer]    Authorization - Visit Number 9    Authorization - Number of Visits 12    PT Start Time 1450    PT Stop Time 1530    PT Time Calculation (min) 40 min    Activity Tolerance Patient limited by pain    Behavior During Therapy WFL for tasks assessed/performed              Past Medical History:  Diagnosis Date   Chronic fatigue    Depression    Diabetes mellitus without complication (HCC)    Early cataracts, bilateral    Fibromyalgia    Glaucoma    Headache    Vestibular migraines   History of bronchitis    History of pneumonia    Hypercholesterolemia    RA (rheumatoid arthritis) (HCC)    RA (rheumatoid arthritis) (HCC)    TIA (transient ischemic attack)    Vertigo    Wears glasses    Past Surgical History:  Procedure Laterality Date   APPENDECTOMY     BACK SURGERY  2012, 2017   BILATERAL CARPAL TUNNEL RELEASE     CESAREAN SECTION     x3   DILATION AND CURETTAGE OF UTERUS     GLAUCOMA SURGERY     KNEE ARTHROSCOPY Left    MOUTH SURGERY     TONSILLECTOMY     TOTAL HIP ARTHROPLASTY Right 01/30/2017   Procedure: RIGHT TOTAL HIP ARTHROPLASTY ANTERIOR APPROACH;  Surgeon: Kathryne HitchBlackman, Christopher Y, MD;  Location: [facility];  Service: Orthopedics;  Laterality: Right;   TOTAL HIP REVISION Right 07/28/2019   Procedure: TOTAL HIP REVISION, posterior approach femoal comp;  Surgeon: Samson FredericSwinteck, Brian, MD;  Location: [facility];  Service: Orthopedics;  Laterality: Right;   Patient Active Problem List   Diagnosis  Date Noted   Acute bronchitis 03/23/2020   Elevated troponin 09/23/2019   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) 08/24/2019   Enterococcal infection 08/24/2019   Rheumatoid arthritis (HCC) 08/24/2019   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) 01/13/2019   Acute deep vein thrombosis (DVT) of right lower extremity (HCC) 04/01/2017   Epistaxis 03/14/2017   Status post total replacement of right hip 01/30/2017   Avascular necrosis of bone of hip, right (HCC) 12/18/2016   Trochanteric bursitis, right hip 11/04/2016   Pain of right hip joint 11/04/2016   S/P lumbar spinal fusion 07/21/2015    REFERRING DIAG:  M54.2 (ICD-10-CM) - Neck pain   THERAPY DIAG:  Cervicalgia  Cramp and spasm  Muscle weakness (generalized)  Abnormal posture  PERTINENT HISTORY: See above; fibromyalgia  PRECAUTIONS: fall  SUBJECTIVE: Patient states she is having increased pain today.  She fell asleep on the couch with her head in an awkward position.  She continues to experience pain at the sub occipital area and cranial area.   PAIN:  Are you having pain? Yes: NPRS scale: 5/10 Pain location: neck and shoulders Pain description: aching Aggravating factors: anything Relieving factors: meds  OBJECTIVE: (objective measures completed at initial evaluation unless otherwise dated)   OBJECTIVE:    DIAGNOSTIC FINDINGS:  None in patient chart   PATIENT SURVEYS:  FOTO 40     COGNITION: Overall cognitive status: Within functional limits for tasks assessed     SENSATION: WFL   POSTURE:  Rounded shoulders, fwd head, thoracic kyphosis, hips flexed, fwd trunk, knees flexed   PALPATION: Tight and tender to palpation bilateral upper traps and suboccipital area.                CERVICAL ROM:    Active ROM A/PROM (deg) 05/24/2021  Flexion WNL  Extension 25%  Right lateral flexion 50%  Left lateral flexion 50%  Right rotation 50%   Left rotation 50%   (Blank rows = not tested)   UE ROM:   Limited UE flexion and abduction to approx 120 degrees   UE MMT: Weakness bilateral shoulder/rotator cuff: generally 4-/5 bilaterally       PATIENT SURVEYS:  FOTO 40   TODAY'S TREATMENT:  06/26/21 NuStep x 5 min level 4 Seated thoracic extension with purple ball x 10 Seated cervical retraction 2 x 10 Back against wall shoulder retraction 2 x 10, cervical retraction 2 x 10 (vc's to aim base of head toward wall) Facing wall arm slides up, then down to 90 degrees with lift off x 10 Facing wall lower trap lift offs 2 x 10 Instructed in use of thera cane and where to purchase for bilateral upper traps and sub-occipital area. Bilateral shoulder ER x 20 06/19/21 UBE x 9 min (4.6min fwd and 4.5 min backward) Shoulder extension, rows, bilateral ER and horizontal abduction x 20 each (red) Moist heat with interferential e stim to bilateral cervical paraspinals and upper traps  06/12/21 UBE x 5 min (2.5 min fwd and 2.5 min backward) Fwd bent shoulder extension, horizontal abduction, and rows x 20 both with 2 lb Seating bilateral shoulder rows with red tband x 20 Seated bilateral shoulder ER with red tband x 20 Seated bilateral horizontal abduction with red tband x 20 Standing 4 D ball rolls both x 20 each Standing 3 way scapular stabilization Seated manual treatment:  STM bil upper traps, SO, addaday to bilateral upper traps and parascapular areas.           PATIENT EDUCATION:  Education details: Reviewed HEP.   Person educated: Patient Education method: Explanation Education comprehension: verbalized understanding     HOME EXERCISE PROGRAM: Access Code: [facility] URL: [email or web address] Date: 05/29/2021 Prepared by: Candyce Churn  Exercises - Seated Scapular Retraction  - 1 x daily - 7 x weekly - 3 sets - 10 reps - Shoulder Rolls in Sitting  - 1 x daily - 7 x weekly - 3 sets - 10 reps - Seated  Cervical Retraction  - 1 x daily - 7 x weekly - 3 sets - 10 reps - Seated Cervical Rotation AROM  - 1 x daily - 7 x weekly - 3 sets - 10 reps - Seated Cervical Sidebending AROM  - 1 x daily - 7 x weekly - 3 sets - 10 reps - Shoulder extension with resistance - Neutral  - 1 x daily - 7 x weekly - 3 sets - 10 reps - Standing Shoulder Row with Anchored Resistance  - 1 x daily - 7 x weekly - 3 sets - 10 reps - Seated Shoulder External Rotation AAROM with Pulley  - 1 x daily - 7 x weekly - 3 sets -  10 reps - Seated Shoulder Horizontal Abduction with Resistance  - 1 x daily - 7 x weekly - 3 sets - 10 reps   ASSESSMENT:   CLINICAL IMPRESSION: [name] is progressing slowly due to chronic multi joint arthritis and poor fitness and posture.  She was able to complete all tasks today but with difficulty. She is able to do outdoor tasks and other activities that are inconsistent with her presentation in clinic.  She walks into clinic with cane and moves extremely slowly from one task to the next but speaks of working outside in her yard doing yard work.  She states that over the weekend, she bagged up large piles of grass to take to the curb.    She would benefit from continued PT for postural strengthening and c spine ROM.     OBJECTIVE IMPAIRMENTS Abnormal gait, decreased mobility, decreased ROM, decreased strength, dizziness, hypomobility, increased fascial restrictions, increased muscle spasms, impaired flexibility, impaired UE functional use, postural dysfunction, and pain.    ACTIVITY LIMITATIONS cleaning, community activity, driving, meal prep, laundry, yard work, and shopping.    PERSONAL FACTORS Fitness, Past/current experiences, Time since onset of injury/illness/exacerbation, and 1-2 comorbidities: depression, fibromyalgia  are also affecting patient's functional outcome.      REHAB POTENTIAL: Fair due to mutliple orthopedic issues and sleeping in recliner   CLINICAL DECISION MAKING:  Evolving/moderate complexity   EVALUATION COMPLEXITY: Moderate     GOALS: Goals reviewed with patient? Yes   SHORT TERM GOALS: Target date: 06/21/2021   Patient will be independent with initial HEP  Baseline: na Goal status: INITIAL   2.  Pain report to be no greater than 4/10  Baseline: na Goal status: INITIAL     LONG TERM GOALS: Target date: 07/19/2021    Patient to be independent with advanced HEP  Baseline: na Goal status: INITIAL   2.  Patient to report pain no greater than 2/10  Baseline: na Goal status: INITIAL   3.  FOTO to be 54 Baseline: 40 Goal status: INITIAL   4.  Patient to report 50% improvement in overall symptoms Baseline: na Goal status: INITIAL   5.  Patient to be able to rotate C spine safely to look for oncoming traffic and back safely when driving Baseline: na Goal status: INITIAL   6.  Patient to be able to sleep in bed. Baseline: na Goal status: INITIAL     PLAN: PT FREQUENCY: 2x/week   PT DURATION: 8 weeks   PLANNED INTERVENTIONS: Therapeutic exercises, Therapeutic activity, Neuromuscular re-education, Balance training, Gait training, Patient/Family education, Joint mobilization, Vestibular training, Canalith repositioning, DME instructions, Aquatic Therapy, Dry Needling, Electrical stimulation, Spinal mobilization, Cryotherapy, Moist heat, Taping, Traction, Ultrasound, Ionotophoresis 4mg /ml Dexamethasone, and Manual therapy   PLAN FOR NEXT SESSION:  Continue bilateral rotator cuff strengthening, addaday to bilateral upper traps, progress manual C spine ROM and postural strengthening, add in rotator cuff strengthening and scapular stabilization to reduce over activity of upper trap and levator scapula which is likely causing her sub occipital pain.   Anderson Malta B. Rayel Santizo, PT 06/26/21 5:22 PM

## 2021-06-27 DIAGNOSIS — M7071 Other bursitis of hip, right hip: Secondary | ICD-10-CM | POA: Diagnosis not present

## 2021-06-27 DIAGNOSIS — M25551 Pain in right hip: Secondary | ICD-10-CM | POA: Diagnosis not present

## 2021-06-28 ENCOUNTER — Ambulatory Visit: Payer: Medicare PPO

## 2021-06-29 DIAGNOSIS — J3081 Allergic rhinitis due to animal (cat) (dog) hair and dander: Secondary | ICD-10-CM | POA: Diagnosis not present

## 2021-06-29 DIAGNOSIS — J3089 Other allergic rhinitis: Secondary | ICD-10-CM | POA: Diagnosis not present

## 2021-06-29 DIAGNOSIS — J301 Allergic rhinitis due to pollen: Secondary | ICD-10-CM | POA: Diagnosis not present

## 2021-07-03 ENCOUNTER — Ambulatory Visit: Payer: Medicare PPO

## 2021-07-03 DIAGNOSIS — M542 Cervicalgia: Secondary | ICD-10-CM

## 2021-07-03 DIAGNOSIS — R252 Cramp and spasm: Secondary | ICD-10-CM | POA: Diagnosis not present

## 2021-07-03 DIAGNOSIS — R293 Abnormal posture: Secondary | ICD-10-CM | POA: Diagnosis not present

## 2021-07-03 DIAGNOSIS — M6281 Muscle weakness (generalized): Secondary | ICD-10-CM | POA: Diagnosis not present

## 2021-07-03 MED ORDER — METOPROLOL TARTRATE 100 MG PO TABS: 100.0000 mg | ORAL_TABLET | Freq: Once | ORAL | 0 refills | Status: DC

## 2021-07-03 NOTE — Addendum Note (Signed)
Addended by: Franchot Gallo on: 07/03/2021 11:17 AM   Modules accepted: Orders

## 2021-07-03 NOTE — Therapy (Signed)
OUTPATIENT PHYSICAL THERAPY TREATMENT NOTE   Patient Name: Ikeisha Blumberg MRN: [id number] DOB:1945-05-03, 76 y.o., female Today's Date: 07/03/2021  PCP: Clayborn Heron, MD REFERRING PROVIDER: Butch Penny, NP  END OF SESSION:   PT End of Session - 07/03/21 1516     Visit Number 10    Date for PT Re-Evaluation 07/19/21    Authorization Type [employer]    Authorization Time Period Cohere-Approved 12 vl-05/24/2021-07/19/2021-Auth#[id number]  [employer]    Authorization - Visit Number 10    Authorization - Number of Visits 12    Progress Note Due on Visit 10    PT Start Time 1448    PT Stop Time 1530    PT Time Calculation (min) 42 min    Activity Tolerance Patient limited by pain    Behavior During Therapy WFL for tasks assessed/performed              Past Medical History:  Diagnosis Date   Chronic fatigue    Depression    Diabetes mellitus without complication (HCC)    Early cataracts, bilateral    Fibromyalgia    Glaucoma    Headache    Vestibular migraines   History of bronchitis    History of pneumonia    Hypercholesterolemia    RA (rheumatoid arthritis) (HCC)    RA (rheumatoid arthritis) (HCC)    TIA (transient ischemic attack)    Vertigo    Wears glasses    Past Surgical History:  Procedure Laterality Date   APPENDECTOMY     BACK SURGERY  2012, 2017   BILATERAL CARPAL TUNNEL RELEASE     CESAREAN SECTION     x3   DILATION AND CURETTAGE OF UTERUS     GLAUCOMA SURGERY     KNEE ARTHROSCOPY Left    MOUTH SURGERY     TONSILLECTOMY     TOTAL HIP ARTHROPLASTY Right 01/30/2017   Procedure: RIGHT TOTAL HIP ARTHROPLASTY ANTERIOR APPROACH;  Surgeon: Kathryne Hitch, MD;  Location: [facility];  Service: Orthopedics;  Laterality: Right;   TOTAL HIP REVISION Right 07/28/2019   Procedure: TOTAL HIP REVISION, posterior approach femoal comp;  Surgeon: Samson Frederic, MD;  Location: [facility];  Service: Orthopedics;  Laterality: Right;    Patient Active Problem List   Diagnosis Date Noted   Acute bronchitis 03/23/2020   Elevated troponin 09/23/2019   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) 08/24/2019   Enterococcal infection 08/24/2019   Rheumatoid arthritis (HCC) 08/24/2019   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) 01/13/2019   Acute deep vein thrombosis (DVT) of right lower extremity (HCC) 04/01/2017   Epistaxis 03/14/2017   Status post total replacement of right hip 01/30/2017   Avascular necrosis of bone of hip, right (HCC) 12/18/2016   Trochanteric bursitis, right hip 11/04/2016   Pain of right hip joint 11/04/2016   S/P lumbar spinal fusion 07/21/2015    REFERRING DIAG:  M54.2 (ICD-10-CM) - Neck pain   THERAPY DIAG:  Cervicalgia  Cramp and spasm  Muscle weakness (generalized)  Abnormal posture  PERTINENT HISTORY: See above; fibromyalgia  PRECAUTIONS: fall  SUBJECTIVE: Patient states she had a lot of pain in her right hip since beginning therapy at the other PT place.  I can hardly move.   PAIN:  Are you having pain? Yes: NPRS scale: 5/10 Pain location: neck and shoulders Pain description: aching Aggravating factors: anything Relieving factors: meds   OBJECTIVE: (objective  measures completed at initial evaluation unless otherwise dated)   OBJECTIVE:    DIAGNOSTIC FINDINGS:  None in patient chart   PATIENT SURVEYS:  FOTO 40     COGNITION: Overall cognitive status: Within functional limits for tasks assessed     SENSATION: WFL   POSTURE:  Rounded shoulders, fwd head, thoracic kyphosis, hips flexed, fwd trunk, knees flexed   PALPATION: Tight and tender to palpation bilateral upper traps and suboccipital area.                CERVICAL ROM:    Active ROM A/PROM (deg) 05/24/2021  Flexion WNL  Extension 25%  Right lateral flexion 50%  Left lateral flexion 50%  Right rotation 50%  Left rotation 50%   (Blank rows =  not tested)   UE ROM:   Limited UE flexion and abduction to approx 120 degrees   UE MMT: Weakness bilateral shoulder/rotator cuff: generally 4-/5 bilaterally       PATIENT SURVEYS:  FOTO 40   TODAY'S TREATMENT:  07/03/21 NuStep x 10 min level 4 Seated shoulder bilateral shoulder ER and bilateral shoulder abduction Fwd bent shoulder extension, horizontal abduction, and rows x 20 both with 2 lb 4D ball rolls x 20 each UE 3 way scapular stabilization with green loop x 10 each UE Discussion re: activities she is able to do at home: mowing her lawn, emptying her bagger on her lawn mower, bagging grass clippings and working outside  06/26/21 NuStep x 5 min level 4 Seated thoracic extension with purple ball x 10 Seated cervical retraction 2 x 10 Back against wall shoulder retraction 2 x 10, cervical retraction 2 x 10 (vc's to aim base of head toward wall) Facing wall arm slides up, then down to 90 degrees with lift off x 10 Facing wall lower trap lift offs 2 x 10 Instructed in use of thera cane and where to purchase for bilateral upper traps and sub-occipital area. Bilateral shoulder ER x 20 06/19/21 UBE x 9 min (4.2min fwd and 4.5 min backward) Shoulder extension, rows, bilateral ER and horizontal abduction x 20 each (red) Moist heat with interferential e stim to bilateral cervical paraspinals and upper traps         PATIENT EDUCATION:  Education details: Reviewed HEP.   Person educated: Patient Education method: Explanation Education comprehension: verbalized understanding     HOME EXERCISE PROGRAM: Access Code: [facility] URL: [email or web address] Date: 05/29/2021 Prepared by: Mikey Kirschner  Exercises - Seated Scapular Retraction  - 1 x daily - 7 x weekly - 3 sets - 10 reps - Shoulder Rolls in Sitting  - 1 x daily - 7 x weekly - 3 sets - 10 reps - Seated Cervical Retraction  - 1 x daily - 7 x weekly - 3 sets - 10 reps - Seated Cervical Rotation AROM   - 1 x daily - 7 x weekly - 3 sets - 10 reps - Seated Cervical Sidebending AROM  - 1 x daily - 7 x weekly - 3 sets - 10 reps - Shoulder extension with resistance - Neutral  - 1 x daily - 7 x weekly - 3 sets - 10 reps - Standing Shoulder Row with Anchored Resistance  - 1 x daily - 7 x weekly - 3 sets - 10 reps - Seated Shoulder External Rotation AAROM with Pulley  - 1 x daily - 7 x weekly - 3 sets - 10 reps - Seated Shoulder Horizontal Abduction with Resistance  - 1 x  daily - 7 x weekly - 3 sets - 10 reps   ASSESSMENT:   CLINICAL IMPRESSION: Bonita QuinLinda is responding well to postural and shoulder strengthening.  She continues to work outside mowing her lawn and bagging grass clippings.   She would benefit from continued PT for postural strengthening and c spine ROM.     OBJECTIVE IMPAIRMENTS Abnormal gait, decreased mobility, decreased ROM, decreased strength, dizziness, hypomobility, increased fascial restrictions, increased muscle spasms, impaired flexibility, impaired UE functional use, postural dysfunction, and pain.    ACTIVITY LIMITATIONS cleaning, community activity, driving, meal prep, laundry, yard work, and shopping.    PERSONAL FACTORS Fitness, Past/current experiences, Time since onset of injury/illness/exacerbation, and 1-2 comorbidities: depression, fibromyalgia  are also affecting patient's functional outcome.      REHAB POTENTIAL: Fair due to mutliple orthopedic issues and sleeping in recliner   CLINICAL DECISION MAKING: Evolving/moderate complexity   EVALUATION COMPLEXITY: Moderate     GOALS: Goals reviewed with patient? Yes   SHORT TERM GOALS: Target date: 06/21/2021   Patient will be independent with initial HEP  Baseline: na Goal status: INITIAL   2.  Pain report to be no greater than 4/10  Baseline: na Goal status: INITIAL     LONG TERM GOALS: Target date: 07/19/2021    Patient to be independent with advanced HEP  Baseline: na Goal status: INITIAL   2.   Patient to report pain no greater than 2/10  Baseline: na Goal status: INITIAL   3.  FOTO to be 54 Baseline: 40 Goal status: INITIAL   4.  Patient to report 50% improvement in overall symptoms Baseline: na Goal status: INITIAL   5.  Patient to be able to rotate C spine safely to look for oncoming traffic and back safely when driving Baseline: na Goal status: INITIAL   6.  Patient to be able to sleep in bed. Baseline: na Goal status: INITIAL     PLAN: PT FREQUENCY: 2x/week   PT DURATION: 8 weeks   PLANNED INTERVENTIONS: Therapeutic exercises, Therapeutic activity, Neuromuscular re-education, Balance training, Gait training, Patient/Family education, Joint mobilization, Vestibular training, Canalith repositioning, DME instructions, Aquatic Therapy, Dry Needling, Electrical stimulation, Spinal mobilization, Cryotherapy, Moist heat, Taping, Traction, Ultrasound, Ionotophoresis 4mg /ml Dexamethasone, and Manual therapy   PLAN FOR NEXT SESSION:  Continue bilateral rotator cuff strengthening, addaday to bilateral upper traps, progress manual C spine ROM and postural strengthening, add in rotator cuff strengthening and scapular stabilization to reduce over activity of upper trap and levator scapula which is likely causing her sub occipital pain.   Victorino DikeJennifer B. Demonica Farrey, PT 07/03/21 9:33 PM

## 2021-07-03 NOTE — Telephone Encounter (Signed)
Spoke with patient and discussed coronary CTA for CP per Dr. Tamala Julian.  Patient agreeable, instructions reviewed.  Radiology scheduler to reach out and schedule appt with patient. Patient verbalized understanding.

## 2021-07-03 NOTE — Telephone Encounter (Signed)
Pt returning call. Transferred to Kristie Johnson, RN.  

## 2021-07-03 NOTE — Telephone Encounter (Signed)
Left message for patient to callback to discuss coronary CTA.

## 2021-07-04 DIAGNOSIS — M7071 Other bursitis of hip, right hip: Secondary | ICD-10-CM | POA: Diagnosis not present

## 2021-07-04 DIAGNOSIS — M25551 Pain in right hip: Secondary | ICD-10-CM | POA: Diagnosis not present

## 2021-07-05 ENCOUNTER — Ambulatory Visit: Payer: Medicare PPO | Attending: Adult Health

## 2021-07-05 DIAGNOSIS — Z79899 Other long term (current) drug therapy: Secondary | ICD-10-CM | POA: Diagnosis not present

## 2021-07-05 DIAGNOSIS — R293 Abnormal posture: Secondary | ICD-10-CM | POA: Diagnosis present

## 2021-07-05 DIAGNOSIS — M542 Cervicalgia: Secondary | ICD-10-CM | POA: Insufficient documentation

## 2021-07-05 DIAGNOSIS — M6281 Muscle weakness (generalized): Secondary | ICD-10-CM | POA: Insufficient documentation

## 2021-07-05 DIAGNOSIS — R252 Cramp and spasm: Secondary | ICD-10-CM | POA: Diagnosis present

## 2021-07-05 DIAGNOSIS — M0589 Other rheumatoid arthritis with rheumatoid factor of multiple sites: Secondary | ICD-10-CM | POA: Diagnosis not present

## 2021-07-05 NOTE — Therapy (Signed)
OUTPATIENT PHYSICAL THERAPY TREATMENT NOTE   Patient Name: [name] MRN: [id number] DOB:[birth year], 76 y.o., female Today's Date: 07/05/2021  PCP: Aretta Nip, MD REFERRING PROVIDER: Ward Givens, NP  END OF SESSION:   PT End of Session - 07/05/21 1501     Visit Number 11    Date for PT Re-Evaluation 07/19/21    Authorization Type [employer]    Authorization Time Period Cohere-Approved 50 vl-05/24/2021-07/19/2021-Auth#[id number]  [employer]    Authorization - Visit Number 11    Authorization - Number of Visits 12    PT Start Time 1450    PT Stop Time 1530    PT Time Calculation (min) 40 min    Activity Tolerance Patient limited by pain    Behavior During Therapy WFL for tasks assessed/performed              Past Medical History:  Diagnosis Date   Chronic fatigue    Depression    Diabetes mellitus without complication ([place])    Early cataracts, bilateral    Fibromyalgia    Glaucoma    Headache    Vestibular migraines   History of bronchitis    History of pneumonia    Hypercholesterolemia    RA (rheumatoid arthritis) ([place])    RA (rheumatoid arthritis) ([place])    TIA (transient ischemic attack)    Vertigo    Wears glasses    Past Surgical History:  Procedure Laterality Date   APPENDECTOMY     BACK SURGERY  2012, 2017   [place]     x3   DILATION AND CURETTAGE OF UTERUS     GLAUCOMA SURGERY     KNEE ARTHROSCOPY Left    MOUTH SURGERY     TONSILLECTOMY     TOTAL HIP ARTHROPLASTY Right 01/30/2017   Procedure: RIGHT TOTAL HIP ARTHROPLASTY ANTERIOR APPROACH;  Surgeon: Mcarthur Rossetti, MD;  Location: [facility];  Service: Orthopedics;  Laterality: Right;   TOTAL HIP REVISION Right 07/28/2019   Procedure: TOTAL HIP REVISION, posterior approach femoal comp;  Surgeon: Rod Can, MD;  Location: [place];  Service: Orthopedics;  Laterality: Right;   Patient Active Problem List    Diagnosis Date Noted   Acute bronchitis 03/23/2020   Elevated troponin 09/23/2019   Infection of right prosthetic hip joint ([place]) 08/24/2019   Enterococcal infection 08/24/2019   Rheumatoid arthritis ([place]) 08/24/2019   Failed total hip arthroplasty ([place]) 07/28/2019   Femoral loosening of prosthetic right hip ([place]) 01/13/2019   Acute deep vein thrombosis (DVT) of right lower extremity ([place]) 04/01/2017   Epistaxis 03/14/2017   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  She is ready to graduate.  She feels she is still limited with her side bending to right but she is pleased that she can turn her head now for driving purposes.  She is continuing to go to her therapy for her hip.    PAIN:  Are you having pain? Yes: NPRS  scale: 4/10 Pain location: neck and shoulders Pain description: aching Aggravating factors: anything Relieving factors: meds   OBJECTIVE: (objective measures completed at initial evaluation unless otherwise dated)   OBJECTIVE:    DIAGNOSTIC FINDINGS:  None in patient chart   PATIENT SURVEYS:  FOTO 40 (on DC 07/05/21 = 49)     COGNITION: Overall cognitive status: Within functional limits for tasks assessed     SENSATION: WFL   POSTURE:  Rounded shoulders, fwd head, thoracic kyphosis, hips flexed, fwd trunk, knees flexed   PALPATION: Tight and tender to palpation bilateral upper traps and suboccipital area.                CERVICAL ROM:    Active ROM A/PROM (deg) 07/05/2021  Flexion WNL   Extension 50%  Right lateral flexion 50%  Left lateral flexion 75%  Right rotation 75%  Left rotation 75%   (Blank rows = not tested)   UE ROM:    UE flexion and abduction improved to approx 140 degrees   UE MMT: bilateral shoulder/rotator cuff improved to: generally 4/5 bilaterally       PATIENT SURVEYS:  FOTO 40  (on DC 07/05/21 = 49)   TODAY'S TREATMENT:  07/05/21 NuStep x 10 min level 4 Reviewed HEP PROM C spine, sub occipital release, upper trap and levator stretch x 10 min Educated on appropriate DC plan  07/03/21 NuStep x 10 min level 4 Seated shoulder bilateral shoulder ER and bilateral shoulder abduction Fwd bent shoulder extension, horizontal abduction, and rows x 20 both with 2 lb 4D ball rolls x 20 each UE 3 way scapular stabilization with green loop x 10 each UE Discussion re: activities she is able to do at home: mowing her lawn, emptying her bagger on her lawn mower, bagging grass clippings and working outside  06/26/21 NuStep x 5 min level 4 Seated thoracic extension with purple ball x 10 Seated cervical retraction 2 x 10 Back against wall shoulder retraction 2 x 10, cervical retraction 2 x 10 (vc's to aim base of head toward wall) Facing wall arm slides up, then down to 90 degrees with lift off x 10 Facing wall lower trap lift offs 2 x 10 Instructed in use of thera cane and where to purchase for bilateral upper traps and sub-occipital area. Bilateral shoulder ER x 20 06/19/21 UBE x 9 min (4.39mn fwd and 4.5 min backward) Shoulder extension, rows, bilateral ER and horizontal abduction x 20 each (red) Moist heat with interferential e stim to bilateral cervical paraspinals and upper traps         PATIENT EDUCATION:  Education details: Reviewed HEP.   Person educated: Patient Education method: Explanation Education comprehension: verbalized understanding     HOME EXERCISE PROGRAM: [facility]: [facility]:  [email or web address] Date: 05/29/2021 Prepared by: JCandyce Churn Exercises - Seated Scapular Retraction  - 1 x daily - 7 x weekly - 3 sets - 10 reps - Shoulder Rolls in Sitting  - 1 x daily - 7 x weekly - 3 sets - 10 reps - Seated Cervical Retraction  - 1 x daily - 7 x weekly - 3 sets - 10 reps - Seated Cervical Rotation AROM  - 1 x daily - 7 x weekly - 3 sets - 10 reps - Seated Cervical Sidebending AROM  - 1 x daily - 7 x weekly - 3 sets - 10 reps - Shoulder extension with resistance - Neutral  - 1 x daily - 7 x weekly -  3 sets - 10 reps - Standing Shoulder Row with Anchored Resistance  - 1 x daily - 7 x weekly - 3 sets - 10 reps - Seated Shoulder External Rotation AAROM with Pulley  - 1 x daily - 7 x weekly - 3 sets - 10 reps - Seated Shoulder Horizontal Abduction with Resistance  - 1 x daily - 7 x weekly - 3 sets - 10 reps   ASSESSMENT:   CLINICAL IMPRESSION: Caylan has met max potential with her therapy and has met most of her goals.  She is able to do fairly high level activity around her home such as mowing and bagging grass clippings and carrying these to the street.   She is independent with all ADL's and IADL's.  She should continue to improve.     OBJECTIVE IMPAIRMENTS Abnormal gait, decreased mobility, decreased ROM, decreased strength, dizziness, hypomobility, increased fascial restrictions, increased muscle spasms, impaired flexibility, impaired UE functional use, postural dysfunction, and pain.    ACTIVITY LIMITATIONS cleaning, community activity, driving, meal prep, laundry, yard work, and shopping.    PERSONAL FACTORS Fitness, Past/current experiences, Time since onset of injury/illness/exacerbation, and 1-2 comorbidities: depression, fibromyalgia  are also affecting patient's functional outcome.      REHAB POTENTIAL: Fair due to mutliple orthopedic issues and sleeping in recliner   CLINICAL DECISION MAKING: Evolving/moderate complexity   EVALUATION  COMPLEXITY: Moderate     GOALS: Goals reviewed with patient? Yes   SHORT TERM GOALS: Target date: 06/21/2021   Patient will be independent with initial HEP  Baseline: na Goal status: MET   2.  Pain report to be no greater than 4/10  Baseline: na Goal status: MET     LONG TERM GOALS: Target date: 07/19/2021    Patient to be independent with advanced HEP  Baseline: na Goal status: Partially met   2.  Patient to report pain no greater than 2/10  Baseline: na Goal status: Partially met   3.  FOTO to be 54 Baseline: 40 Goal status: Not met (49 final score)   4.  Patient to report 50% improvement in overall symptoms Baseline: na Goal status: MET   5.  Patient to be able to rotate C spine safely to look for oncoming traffic and back safely when driving Baseline: na Goal status: MET   6.  Patient to be able to sleep in bed. Baseline: na Goal status: Partially met    PLAN: PT FREQUENCY: 2x/week   PT DURATION: 8 weeks   PLANNED INTERVENTIONS: Therapeutic exercises, Therapeutic activity, Neuromuscular re-education, Balance training, Gait training, Patient/Family education, Joint mobilization, Vestibular training, Canalith repositioning, DME instructions, Aquatic Therapy, Dry Needling, Electrical stimulation, Spinal mobilization, Cryotherapy, Moist heat, Taping, Traction, Ultrasound, Ionotophoresis 71m/ml Dexamethasone, and Manual therapy   PLAN FOR NEXT SESSION:  Continue bilateral rotator cuff strengthening, addaday to bilateral upper traps, progress manual C spine ROM and postural strengthening, add in rotator cuff strengthening and scapular stabilization to reduce over activity of upper trap and levator scapula which is likely causing her sub occipital pain.  PHYSICAL THERAPY DISCHARGE SUMMARY  Visits from Start of Care: 10  Current functional level related to goals / functional outcomes: See above   Remaining deficits: See above   Education / Equipment: See above    Patient agrees to discharge. Patient goals were partially met. Patient is being discharged due to being pleased with the current functional level.   JAnderson MaltaB. Blia Totman, PT 07/05/21 4:55 PM

## 2021-07-06 DIAGNOSIS — M25551 Pain in right hip: Secondary | ICD-10-CM | POA: Diagnosis not present

## 2021-07-06 DIAGNOSIS — M7071 Other bursitis of hip, right hip: Secondary | ICD-10-CM | POA: Diagnosis not present

## 2021-07-09 DIAGNOSIS — J3089 Other allergic rhinitis: Secondary | ICD-10-CM | POA: Diagnosis not present

## 2021-07-09 DIAGNOSIS — J301 Allergic rhinitis due to pollen: Secondary | ICD-10-CM | POA: Diagnosis not present

## 2021-07-09 DIAGNOSIS — J3081 Allergic rhinitis due to animal (cat) (dog) hair and dander: Secondary | ICD-10-CM | POA: Diagnosis not present

## 2021-07-10 DIAGNOSIS — M1712 Unilateral primary osteoarthritis, left knee: Secondary | ICD-10-CM | POA: Diagnosis not present

## 2021-07-11 DIAGNOSIS — M25551 Pain in right hip: Secondary | ICD-10-CM | POA: Diagnosis not present

## 2021-07-11 DIAGNOSIS — M7071 Other bursitis of hip, right hip: Secondary | ICD-10-CM | POA: Diagnosis not present

## 2021-07-13 DIAGNOSIS — J3081 Allergic rhinitis due to animal (cat) (dog) hair and dander: Secondary | ICD-10-CM | POA: Diagnosis not present

## 2021-07-13 DIAGNOSIS — J301 Allergic rhinitis due to pollen: Secondary | ICD-10-CM | POA: Diagnosis not present

## 2021-07-13 DIAGNOSIS — J3089 Other allergic rhinitis: Secondary | ICD-10-CM | POA: Diagnosis not present

## 2021-07-17 DIAGNOSIS — R030 Elevated blood-pressure reading, without diagnosis of hypertension: Secondary | ICD-10-CM | POA: Diagnosis not present

## 2021-07-17 DIAGNOSIS — Z79899 Other long term (current) drug therapy: Secondary | ICD-10-CM | POA: Diagnosis not present

## 2021-07-17 DIAGNOSIS — M2550 Pain in unspecified joint: Secondary | ICD-10-CM | POA: Diagnosis not present

## 2021-07-17 DIAGNOSIS — G894 Chronic pain syndrome: Secondary | ICD-10-CM | POA: Diagnosis not present

## 2021-07-17 DIAGNOSIS — E119 Type 2 diabetes mellitus without complications: Secondary | ICD-10-CM | POA: Diagnosis not present

## 2021-07-17 DIAGNOSIS — E669 Obesity, unspecified: Secondary | ICD-10-CM | POA: Diagnosis not present

## 2021-08-01 ENCOUNTER — Telehealth: Payer: Self-pay | Admitting: Adult Health

## 2021-08-01 ENCOUNTER — Inpatient Hospital Stay (HOSPITAL_COMMUNITY): Admission: RE | Admit: 2021-08-01 | Payer: Medicare PPO | Source: Ambulatory Visit

## 2021-08-01 NOTE — Telephone Encounter (Signed)
Has she restarted the topamax? I would restart at 50 mg daily for 2-3 days if no improvement then can increase to BID

## 2021-08-01 NOTE — Telephone Encounter (Signed)
I called the [place] back. [place] Unfortunately she left her meds at home. She was able to fill some at [facility] including Topiramate but she missed 3 days of it. She started having episodes of vertigo which have woken her up at night, occurred in the morning, etc. She took the medication last night. She was able to eat some breakfast and take her medications this AM so the vertigo is not constant. The patient states she never misses her medication and only occasionally has some vertigo if she lays back too fast. She has a headache but it is not bad. She is asking if it's ok for her to take Topiramate 50 mg BID. She also asked if the missed medication may be the cause of her vertigo. Advised it may be but if she is having severe vertigo she should go to the hospital to be evaluated to be on the safe side. Pt verbalized understanding and will await a call back. Can get more topiramate at pharmacy if needed.

## 2021-08-01 NOTE — Telephone Encounter (Signed)
Pt would like a call from the nurse to discuss my vertigo. Advice on what to do other than the medication.

## 2021-08-01 NOTE — Telephone Encounter (Signed)
Spoke with patient. She took the Topamax last night. She was advised per MM that if she does not see improvement after taking 50 mg daily x 2-3 days, then she can increase to 50 mg BID. Pt's questions were answered and she verbalized appreciation.

## 2021-08-15 ENCOUNTER — Telehealth (HOSPITAL_COMMUNITY): Payer: Self-pay | Admitting: Emergency Medicine

## 2021-08-15 NOTE — Telephone Encounter (Signed)
Attempted to call patient regarding upcoming cardiac CT appointment. °Left message on voicemail with name and callback number °Alexy Bringle RN Navigator Cardiac Imaging °[facility] °[phone] Office °[phone] Cell ° °

## 2021-08-16 ENCOUNTER — Ambulatory Visit (HOSPITAL_COMMUNITY)
Admission: RE | Admit: 2021-08-16 | Discharge: 2021-08-16 | Disposition: A | Discharge status: Home / Self Care | Payer: Medicare PPO | Source: Ambulatory Visit | Attending: Interventional Cardiology | Admitting: Interventional Cardiology

## 2021-08-16 DIAGNOSIS — I2510 Atherosclerotic heart disease of native coronary artery without angina pectoris: Secondary | ICD-10-CM | POA: Diagnosis present

## 2021-08-16 DIAGNOSIS — R931 Abnormal findings on diagnostic imaging of heart and coronary circulation: Secondary | ICD-10-CM | POA: Insufficient documentation

## 2021-08-16 DIAGNOSIS — R079 Chest pain, unspecified: Secondary | ICD-10-CM | POA: Insufficient documentation

## 2021-08-16 MED ORDER — NITROGLYCERIN 0.4 MG SL SUBL
SUBLINGUAL_TABLET | SUBLINGUAL | Status: AC
  Filled 2021-08-16: qty 2

## 2021-08-16 MED ORDER — IOHEXOL 350 MG/ML SOLN
100.0000 mL | Freq: Once | INTRAVENOUS | Status: AC | PRN
Start: 2021-08-16 — End: 2021-08-16
  Administered 2021-08-16: 100 mL via INTRAVENOUS

## 2021-08-16 MED ORDER — NITROGLYCERIN 0.4 MG SL SUBL
0.8000 mg | SUBLINGUAL_TABLET | Freq: Once | SUBLINGUAL | Status: AC
  Administered 2021-08-16: 0.8 mg via SUBLINGUAL

## 2021-08-19 ENCOUNTER — Ambulatory Visit (HOSPITAL_BASED_OUTPATIENT_CLINIC_OR_DEPARTMENT_OTHER)
Admission: RE | Admit: 2021-08-19 | Discharge: 2021-08-19 | Disposition: A | Discharge status: Home / Self Care | Payer: Medicare PPO | Source: Ambulatory Visit | Attending: Cardiovascular Disease | Admitting: Cardiovascular Disease

## 2021-08-19 ENCOUNTER — Other Ambulatory Visit: Payer: Self-pay | Admitting: Cardiovascular Disease

## 2021-08-19 ENCOUNTER — Ambulatory Visit (HOSPITAL_COMMUNITY)
Admission: RE | Admit: 2021-08-19 | Discharge: 2021-08-19 | Disposition: A | Discharge status: Home / Self Care | Payer: Medicare PPO | Source: Ambulatory Visit | Attending: Cardiovascular Disease | Admitting: Cardiovascular Disease

## 2021-08-19 DIAGNOSIS — I2510 Atherosclerotic heart disease of native coronary artery without angina pectoris: Secondary | ICD-10-CM

## 2021-08-19 DIAGNOSIS — R931 Abnormal findings on diagnostic imaging of heart and coronary circulation: Secondary | ICD-10-CM | POA: Diagnosis not present

## 2021-08-19 DIAGNOSIS — R079 Chest pain, unspecified: Secondary | ICD-10-CM | POA: Diagnosis not present

## 2021-08-24 ENCOUNTER — Telehealth: Payer: Self-pay | Admitting: Interventional Cardiology

## 2021-08-24 NOTE — Telephone Encounter (Signed)
Called patient back about her message. Patient stated she has been feeling weak and continues to have SOB. Patient thinks it is the jardiance that it making her feel this way. Encourage patient to hold the medications tomorrow and see if she feels any better. Will send message to Dr. Katrinka Blazing and his nurse for further advisement.

## 2021-08-24 NOTE — Telephone Encounter (Signed)
Pt c/o medication issue:  1. Name of Medication: empagliflozin (JARDIANCE) 10 MG TABS tablet  2. How are you currently taking this medication (dosage and times per day)? Take 1 tablet (10 mg total) by mouth daily before breakfast.  3. Are you having a reaction (difficulty breathing--STAT)? no  4. What is your medication issue? Patient believe this medication is make her weak. Please advise

## 2021-08-27 NOTE — Telephone Encounter (Signed)
Left message for patient informing her that if she feels [name] is causing her to "feel bad" she can stop taking it, and to just call us back to let us know what she decides.  Provided office number for callback.

## 2021-08-28 ENCOUNTER — Ambulatory Visit: Payer: Medicare PPO | Admitting: Internal Medicine

## 2021-08-28 ENCOUNTER — Other Ambulatory Visit: Payer: Self-pay

## 2021-08-28 ENCOUNTER — Encounter: Payer: Self-pay | Admitting: Internal Medicine

## 2021-08-28 DIAGNOSIS — T8451XD Infection and inflammatory reaction due to internal right hip prosthesis, subsequent encounter: Secondary | ICD-10-CM | POA: Diagnosis not present

## 2021-08-28 NOTE — Assessment & Plan Note (Signed)
Her chronic, multifactorial right hip pain is unchanged for months after stopping amoxicillin therapy.  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  She will follow-up here as needed.

## 2021-08-28 NOTE — Progress Notes (Signed)
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   ACCU-CHEK SOFTCLIX LANCETS LANCETS    SMARTSIG:Topical   ACETAMINOPHEN (TYLENOL) 325 MG TABLET    Take 2 tablets (650 mg total) by mouth every 6 (six) hours as needed for mild pain (pain score 1-3 or temp > 100.5).   CETIRIZINE (ZYRTEC) 10 MG TABLET    Take 10 mg by mouth daily.    CHOLECALCIFEROL (VITAMIN D) 50 MCG (2000 UT) TABLET    Take 2,000 Units by mouth daily.    DOCUSATE SODIUM (COLACE) 100 MG CAPSULE    Take 1 capsule (100 mg total) by mouth 2 (two) times daily.   DULOXETINE (CYMBALTA) 30 MG CAPSULE    Take 60 mg by mouth daily.    EMPAGLIFLOZIN (JARDIANCE) 10 MG TABS TABLET    Take 1 tablet (10 mg total) by mouth daily before breakfast.   EPIPEN 2-PAK 0.3 MG/0.3ML SOAJ INJECTION    Inject 0.3 mg into the muscle daily as needed. For anaphylactic allergic reaction   EZETIMIBE (ZETIA) 10 MG TABLET    Take 10 mg by mouth daily.   FENOFIBRATE 54 MG TABLET    Take 54 mg by mouth daily.   FOLIC ACID (FOLVITE) 1 MG TABLET    Take 1 mg by mouth daily.    HYDROCODONE-ACETAMINOPHEN (NORCO) 7.5-325 MG TABLET    Take 1-4 tablets by mouth every 6 (six) hours as needed for moderate pain.   HYDROXYZINE (ATARAX/VISTARIL) 25 MG TABLET    Take 25 mg by mouth daily.   LATANOPROST (XALATAN) 0.005 % OPHTHALMIC SOLUTION    Place 1 drop into both eyes at bedtime.    METOPROLOL TARTRATE (LOPRESSOR) 100 MG TABLET    Take 1 tablet (100 mg total) by mouth once for 1 dose. Take 2 hours prior to CT scan.   NON FORMULARY    Inject 1 Syringe into the skin 2 (two) times a week. Allergy Shots   PIOGLITAZONE (ACTOS) 30 MG TABLET    Take 30 mg by mouth daily.   ROSUVASTATIN (CRESTOR) 5 MG TABLET    5 mg. Take Monday, Wednesday ,friday   TOCILIZUMAB (ACTEMRA) 200 MG/10ML SOLN       TOPIRAMATE (TOPAMAX) 50 MG TABLET    Take 1 tablet (50 mg total) by mouth at bedtime.  Modified Medications   No medications on file  Discontinued Medications   AMOXICILLIN (AMOXIL) 500 MG CAPSULE    Take 1 capsule (500 mg total) by mouth 2 (two) times daily.    Subjective: [name] is in for her  routine follow-up visit.  She was found to have enterococcal right prosthetic hip infection in June 2021.  She had been taking chronic, suppressive amoxicillin ever since but developed diarrhea and stopped taking the amoxicillin on her own about 4 months ago.  Her diarrhea resolved promptly she has chronic right hip pain but that has not changed since stopping her antibiotic.  Review of Systems: Review of Systems  Constitutional:  Negative for fever.  Respiratory:  Positive for shortness of breath. Negative for cough.   Cardiovascular:  Negative for chest pain.  Gastrointestinal:  Negative for diarrhea.  Musculoskeletal:  Positive for joint pain.    Past Medical History:  Diagnosis Date   Chronic fatigue    Depression    Diabetes mellitus without complication (HCC)    Early cataracts, bilateral    Fibromyalgia    Glaucoma    Headache    Vestibular migraines   History of bronchitis     History of pneumonia    Hypercholesterolemia    RA (rheumatoid arthritis) (HCC)    RA (rheumatoid arthritis) (HCC)    TIA (transient ischemic attack)    Vertigo    Wears glasses     Social History   Tobacco Use   Smoking status: Never   Smokeless tobacco: Never  Vaping Use   Vaping Use: Never used  Substance Use Topics   Alcohol use: No   Drug use: No    Family History  Problem Relation Age of Onset   Diabetes Mother    Stroke Mother    Heart disease Mother    High blood pressure Father    Stroke Father    Diabetes Father    Heart disease Father    Heart disease Sister    Migraines Neg Hx     Allergies  Allergen Reactions   Sulfa Antibiotics Other (See Comments)    Don't remember     Objective: Vitals:   08/28/21 1404  Weight: 189 lb (85.7 kg)   Body mass index is 38.17 kg/m.  Physical Exam Constitutional:      Comments: She is in good spirits.  Neurological:     Gait: Gait abnormal.     Comments: She walks slowly with the aid of a cane.  Psychiatric:        Mood and Affect: Mood normal.     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  She will follow-up here as needed.        Cliffton Asters, MD [facility] [phone] pager   [phone] cell 08/28/2021, 2:28 PM

## 2021-10-02 NOTE — Progress Notes (Unsigned)
Cardiology Office Note:    Date:  10/03/2021   ID:  [name], [name] Oct 14, 1945, MRN [id number]  PCP:  Amanda Nip, MD  Cardiologist:  Amanda Grooms, MD   Referring MD: Amanda Nip, MD   Chief Complaint  Patient presents with   Coronary Artery Disease   Congestive Heart Failure    Diastolic.  Unable to tolerate Jardiance.    History of Present Illness:    [name] is a 76 y.o. female with a hx of chronic diastolic heart failure, coronary calcium score 1100, aortic atherosclerosis, DM II, TIA, estrogen deficiency, rheumatoid arthritis, fibromyalgia, spinal stenosis, statin intolerance, and hyperlipidemia.   On Jardiance she had significant nausea vomiting and diarrhea and stopped the medication.  She continues to have lower extremity edema and dyspnea on exertion.  The lower extremities are pitting and tender.  She has a coronary calcium score greater than 1000.  Last LDL was 60 on 5 mg of rosuvastatin Monday, Wednesday, and Friday.  She denies angina.    Past Medical History:  Diagnosis Date   Chronic fatigue    Depression    Diabetes mellitus without complication ([place])    Early cataracts, bilateral    Fibromyalgia    Glaucoma    Headache    Vestibular migraines   History of bronchitis    History of pneumonia    Hypercholesterolemia    RA (rheumatoid arthritis) ([place])    RA (rheumatoid arthritis) ([place])    TIA (transient ischemic attack)    Vertigo    Wears glasses     Past Surgical History:  Procedure Laterality Date   APPENDECTOMY     BACK SURGERY  2012, 2017   BILATERAL CARPAL TUNNEL RELEASE     CESAREAN SECTION     x3   DILATION AND CURETTAGE OF UTERUS     GLAUCOMA SURGERY     KNEE ARTHROSCOPY Left    MOUTH SURGERY     TONSILLECTOMY     TOTAL HIP ARTHROPLASTY Right 01/30/2017   Procedure: RIGHT TOTAL HIP ARTHROPLASTY ANTERIOR APPROACH;  Surgeon: Amanda Rossetti, MD;  Location: [facility];  Service:  Orthopedics;  Laterality: Right;   TOTAL HIP REVISION Right 07/28/2019   Procedure: TOTAL HIP REVISION, posterior approach femoal comp;  Surgeon: Amanda Can, MD;  Location: [place];  Service: Orthopedics;  Laterality: Right;    Current Medications: Current Meds  Medication Sig   ACCU-CHEK GUIDE test strip as needed.   Accu-Chek Softclix Lancets lancets    acetaminophen (TYLENOL) 325 MG tablet Take 2 tablets (650 mg total) by mouth every 6 (six) hours as needed for mild pain (pain score 1-3 or temp > 100.5).   cetirizine (ZYRTEC) 10 MG tablet Take 10 mg by mouth daily.    Cholecalciferol (VITAMIN D) 50 MCG (2000 UT) tablet Take 2,000 Units by mouth daily.    clopidogrel (PLAVIX) 75 MG tablet Take 1 tablet (75 mg total) by mouth daily.   docusate sodium (COLACE) 100 MG capsule Take 1 capsule (100 mg total) by mouth 2 (two) times daily.   DULoxetine (CYMBALTA) 30 MG capsule Take 60 mg by mouth daily.    EPIPEN 2-PAK 0.3 MG/0.3ML SOAJ injection Inject 0.3 mg into the muscle daily as needed. For anaphylactic allergic reaction   ezetimibe (ZETIA) 10 MG tablet Take 10 mg by mouth daily.   fenofibrate 54 MG tablet Take 54 mg by mouth daily.   folic acid (FOLVITE) 1 MG tablet  Take 1 mg by mouth daily.   HYDROcodone-acetaminophen (NORCO) 7.5-325 MG tablet Take 1-4 tablets by mouth every 6 (six) hours as needed for moderate pain.   hydrOXYzine (ATARAX/VISTARIL) 25 MG tablet Take 25 mg by mouth daily.   latanoprost (XALATAN) 0.005 % ophthalmic solution Place 1 drop into both eyes at bedtime.    methotrexate (RHEUMATREX) 2.5 MG tablet Take 10 tablets by mouth once a week.   NON FORMULARY Inject 1 Syringe into the skin 2 (two) times a week. Allergy Shots   pioglitazone (ACTOS) 30 MG tablet Take 30 mg by mouth daily.   rosuvastatin (CRESTOR) 5 MG tablet Take 1 tablet (5 mg total) by mouth daily.   spironolactone (ALDACTONE) 25 MG tablet Take 1 tablet (25 mg total) by mouth daily.   Tocilizumab  (ACTEMRA) 200 MG/10ML SOLN    topiramate (TOPAMAX) 50 MG tablet Take 1 tablet (50 mg total) by mouth at bedtime.   [DISCONTINUED] rosuvastatin (CRESTOR) 5 MG tablet 5 mg. Take Monday, Wednesday ,friday     Allergies:   Sulfa antibiotics   Social History   Socioeconomic History   Marital status: Widowed    Spouse name: Not on file   Number of children: 4   Years of education: 16   Highest education level: Not on file  Occupational History   Occupation: Retired    Comment: Pharmacist, hospital  Tobacco Use   Smoking status: Never   Smokeless tobacco: Never  Vaping Use   Vaping Use: Never used  Substance and Sexual Activity   Alcohol use: No   Drug use: No   Sexual activity: Not on file  Other Topics Concern   Not on file  Social History Narrative   Lives at home alone   Right-handed   Caffeine: tea daily   Has 10 grandchildren   Social Determinants of Health   Financial Resource Strain: Not on file  Food Insecurity: Not on file  Transportation Needs: Not on file  Physical Activity: Not on file  Stress: Not on file  Social Connections: Not on file     Family History: The patient's family history includes Diabetes in her father and mother; Heart disease in her father, mother, and sister; High blood pressure in her father; Stroke in her father and mother. There is no history of Migraines.  ROS:   Please see the history of present illness.    She is having diffuse pain in her legs and of the musculoskeletal territories.  She is being seen by orthopedics, neurology, and will also see or is seeing a rheumatologist, [name].  All other systems reviewed and are negative.  EKGs/Labs/Other Studies Reviewed:    The following studies were reviewed today:  Coronary CTA July 2023: IMPRESSION: 1. Coronary calcium score of 1133. This was 95th percentile for age-, race-, and sex-matched controls.   2. Normal coronary origin with right dominance.   3. Moderate (50-69%) calcified  plaque in the proximal to mid LAD. Will send for FFRct.  4.  Aortic atherosclerosis  Coronary CT FFR: July 2023 IMPRESSION: 1.  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   2. LAD is heavily calcified. Recommend aggressive risk factor modification, including LDL goal <70.    2D Doppler echocardiogram/11/2021: IMPRESSIONS   1. Left ventricular ejection fraction, by estimation, is 60 to 65%. The  left ventricle has normal function. The left ventricle has no regional  wall motion abnormalities. There is mild left ventricular hypertrophy of  the basal-septal segment. Left  ventricular diastolic parameters are consistent with Grade I diastolic  dysfunction (impaired relaxation).   2. Right ventricular systolic function is normal. The right ventricular  size is mildly enlarged. There is normal pulmonary artery systolic  pressure.   3. Left atrial size was severely dilated.   4. No evidence of mitral valve regurgitation.   5. The aortic valve was not well visualized. Aortic valve regurgitation  is not visualized.   6. The inferior vena cava is normal in size with greater than 50%  respiratory variability, suggesting right atrial pressure of 3 mmHg.   EKG:  EKG not performed  Recent Labs: 06/20/2021: ALT 16; BUN 15; Creatinine, Ser 0.91; Potassium 4.6; Sodium 139  Recent Lipid Panel    Component Value Date/Time   CHOL 143 06/20/2021 1040   TRIG 98 06/20/2021 1040   HDL 57 06/20/2021 1040   CHOLHDL 2.5 06/20/2021 1040   CHOLHDL 6.6 02/25/2007 0520   VLDL 29 02/25/2007 0520   LDLCALC 68 06/20/2021 1040    Physical Exam:    VS:  BP (!) 146/78   Pulse 81   Ht 4\' 11"  (1.499 m)   Wt 191 lb (86.6 kg)   SpO2 98%   BMI 38.58 kg/m     Wt Readings from Last 3 Encounters:  10/03/21 191 lb (86.6 kg)  10/03/21 193 lb (87.5 kg)  08/28/21 189 lb (85.7 kg)     GEN: Obese. No acute distress HEENT: Normal NECK: No JVD. LYMPHATICS: No lymphadenopathy CARDIAC: No  murmur. RRR no gallop, but with left greater than right bilateral pitting edema. VASCULAR:  Normal Pulses. No bruits. RESPIRATORY:  Clear to auscultation without rales, wheezing or rhonchi  ABDOMEN: Soft, non-tender, non-distended, No pulsatile mass, MUSCULOSKELETAL: No deformity  SKIN: Warm and dry NEUROLOGIC:  Alert and oriented x 3 PSYCHIATRIC:  Normal affect   ASSESSMENT:    1. Coronary artery disease involving native coronary artery of native heart without angina pectoris   2. Chronic diastolic heart failure (HCC)   3. Hyperlipidemia LDL goal <70   4. LAFB (left anterior fascicular block)    PLAN:    In order of problems listed above:  Wanted to add aspirin 81 mg/day but there is an interaction between methotrexate and aspirin.  Therefore we will use clopidogrel 75 mg daily as antiplatelet therapy.  We also need to drive the LDL to less than 55.  Increase rosuvastatin to 5 mg daily and repeat a liver and lipid panel in 4 to 6 weeks on return.  Coronary calcium score is greater than 1000. SGLT2 therapy was attempted but patient had side effects on Jardiance.  We will start spironolactone 25 mg/day.  Basic metabolic panel in 10 to 14 days. Increase rosuvastatin to 5 mg daily.  In the past, she has had musculoskeletal pain on higher intensity statin therapy-simvastatin. No new EKG performed.  6-week follow-up, liver and lipid panel at that time.  Reassess volume status after 1 month of spironolactone 25 mg/day.  Basic metabolic panel will be done 10 to 14 days after starting spironolactone.   Medication Adjustments/Labs and Tests Ordered: Current medicines are reviewed at length with the patient today.  Concerns regarding medicines are outlined above.  Orders Placed This Encounter  Procedures   Basic metabolic panel   Hepatic function panel   Lipid panel   Meds ordered this encounter  Medications   spironolactone (ALDACTONE) 25 MG tablet    Sig: Take 1 tablet (25 mg total) by  mouth  daily.    Dispense:  90 tablet    Refill:  3   rosuvastatin (CRESTOR) 5 MG tablet    Sig: Take 1 tablet (5 mg total) by mouth daily.    Dispense:  90 tablet    Refill:  3    Dose change   clopidogrel (PLAVIX) 75 MG tablet    Sig: Take 1 tablet (75 mg total) by mouth daily.    Dispense:  90 tablet    Refill:  3    Patient Instructions  Medication Instructions:  Your physician has recommended you make the following change in your medication:   1) START Spironolactone 25mg  daily 2) START Clopidogrel (Plavix) 75mg  daily 3) INCREASE Rosuvastatin (Crestor) to 5mg  daily  *If you need a refill on your cardiac medications before your next appointment, please call your pharmacy*   Lab Work: In 10-14 days: BMET In 4-6 weeks at next office visit: fasting Lipid panel, Hepatic panel  If you have labs (blood work) drawn today and your tests are completely normal, you will receive your results only by: MyChart Message (if you have MyChart) OR A paper copy in the mail If you have any lab test that is abnormal or we need to change your treatment, we will call you to review the results.  Testing/Procedures: NONE  Follow-Up: At [facility], you and your health needs are our priority.  As part of our continuing mission to provide you with exceptional heart care, we have created designated Provider Care Teams.  These Care Teams include your primary Cardiologist (physician) and Advanced Practice Providers (APPs -  Physician Assistants and Nurse Practitioners) who all work together to provide you with the care you need, when you need it.  Your next appointment:   1 month(s)  The format for your next appointment:   In Person  Provider:   , MD    Important Information About Sugar         Signed, 10-30-1978, MD  10/03/2021 2:03 PM    [facility]

## 2021-10-03 ENCOUNTER — Ambulatory Visit: Payer: Medicare PPO | Attending: Interventional Cardiology | Admitting: Interventional Cardiology

## 2021-10-03 ENCOUNTER — Ambulatory Visit: Payer: Medicare PPO | Admitting: Neurology

## 2021-10-03 ENCOUNTER — Encounter: Payer: Self-pay | Admitting: Interventional Cardiology

## 2021-10-03 ENCOUNTER — Encounter: Payer: Self-pay | Admitting: Neurology

## 2021-10-03 VITALS — BP 135/62 | HR 74 | Ht 59.0 in | Wt 193.0 lb

## 2021-10-03 VITALS — BP 146/78 | HR 81 | Ht 59.0 in | Wt 191.0 lb

## 2021-10-03 DIAGNOSIS — M542 Cervicalgia: Secondary | ICD-10-CM | POA: Diagnosis not present

## 2021-10-03 DIAGNOSIS — I5032 Chronic diastolic (congestive) heart failure: Secondary | ICD-10-CM | POA: Diagnosis not present

## 2021-10-03 DIAGNOSIS — R2689 Other abnormalities of gait and mobility: Secondary | ICD-10-CM | POA: Diagnosis not present

## 2021-10-03 DIAGNOSIS — R0609 Other forms of dyspnea: Secondary | ICD-10-CM

## 2021-10-03 DIAGNOSIS — M5412 Radiculopathy, cervical region: Secondary | ICD-10-CM | POA: Diagnosis not present

## 2021-10-03 DIAGNOSIS — M459 Ankylosing spondylitis of unspecified sites in spine: Secondary | ICD-10-CM

## 2021-10-03 DIAGNOSIS — I2510 Atherosclerotic heart disease of native coronary artery without angina pectoris: Secondary | ICD-10-CM | POA: Diagnosis not present

## 2021-10-03 DIAGNOSIS — I444 Left anterior fascicular block: Secondary | ICD-10-CM | POA: Diagnosis not present

## 2021-10-03 DIAGNOSIS — R3989 Other symptoms and signs involving the genitourinary system: Secondary | ICD-10-CM

## 2021-10-03 DIAGNOSIS — M2480 Other specific joint derangements of unspecified joint, not elsewhere classified: Secondary | ICD-10-CM

## 2021-10-03 DIAGNOSIS — W19XXXA Unspecified fall, initial encounter: Secondary | ICD-10-CM | POA: Diagnosis not present

## 2021-10-03 DIAGNOSIS — M5382 Other specified dorsopathies, cervical region: Secondary | ICD-10-CM

## 2021-10-03 DIAGNOSIS — G8929 Other chronic pain: Secondary | ICD-10-CM

## 2021-10-03 DIAGNOSIS — E785 Hyperlipidemia, unspecified: Secondary | ICD-10-CM

## 2021-10-03 DIAGNOSIS — R079 Chest pain, unspecified: Secondary | ICD-10-CM

## 2021-10-03 DIAGNOSIS — R29898 Other symptoms and signs involving the musculoskeletal system: Secondary | ICD-10-CM

## 2021-10-03 DIAGNOSIS — R198 Other specified symptoms and signs involving the digestive system and abdomen: Secondary | ICD-10-CM

## 2021-10-03 MED ORDER — ROSUVASTATIN CALCIUM 5 MG PO TABS: 5.0000 mg | ORAL_TABLET | Freq: Every day | ORAL | 3 refills | Status: DC

## 2021-10-03 MED ORDER — SPIRONOLACTONE 25 MG PO TABS: 25.0000 mg | ORAL_TABLET | Freq: Every day | ORAL | 3 refills | Status: DC

## 2021-10-03 MED ORDER — CLOPIDOGREL BISULFATE 75 MG PO TABS: 75.0000 mg | ORAL_TABLET | Freq: Every day | ORAL | 3 refills | Status: DC

## 2021-10-03 NOTE — Patient Instructions (Signed)
MRI cervical spine 2. Can send you for injections into the spine (epidural steroid injections or facet blocks depending on what the cause is in the neck). Also consider topical occipital nerve blocks (see below for description, topical lidocaine to numb the nerve)   Occipital Neuralgia  Occipital neuralgia is a type of headache that causes brief episodes of very bad pain in the back of the head. Pain from occipital neuralgia may spread (radiate) to other parts of the head. These headaches may be caused by irritation of the nerves that leave the spinal cord high up in the neck, just below the base of the skull (occipital nerves). The occipital nerves transmit sensations from the back of the head, the top of the head, and the areas behind the ears. What are the causes? This condition can occur without any known cause (primary headache syndrome). In other cases, this condition is caused by pressure on or irritation of one of the two occipital nerves. Pressure and irritation may be due to: Muscle spasm in the neck. Neck injury. Wear and tear of the vertebrae in the neck (osteoarthritis). Disease of the disks that separate the vertebrae. Swollen blood vessels that put pressure on the occipital nerves. Infections. Tumors. Diabetes. What are the signs or symptoms? This condition causes brief burning, stabbing, electric, shocking, or shooting pain in the back of the head that can radiate to the top of the head. It can happen on one side or both sides of the head. It can also cause: Pain behind the eye. Pain triggered by neck movement or hair brushing. Scalp tenderness. Aching in the back of the head between episodes of very bad pain. Pain that gets worse with exposure to bright lights. How is this diagnosed? Your health care provider may diagnose the condition based on a physical exam and your symptoms. Tests may be done, such as: Imaging studies of the brain and neck (cervical spine), such as an  MRI or CT scan. These look for causes of pinched nerves. Applying pressure to the nerves in the neck to try to re-create the pain. Injection of numbing medicine into the occipital nerve areas to see if pain goes away (diagnostic nerve block). How is this treated? Treatment for this condition may begin with simple measures, such as: Rest. Massage. Applying heat or cold to the area. Over-the-counter pain relievers. If these measures do not work, you may need other treatments, including: Medicines, such as: Prescription-strength anti-inflammatory medicines. Muscle relaxants. Anti-seizure medicines, which can relieve pain. Antidepressants, which can relieve pain. Injected medicines, such as medicines that numb the area (local anesthetic) and steroids. Pulsed radiofrequency ablation. This is when wires are implanted to deliver electrical impulses that block pain signals from the occipital nerve. Surgery to relieve nerve pressure. Physical therapy. Follow these instructions at home: Managing pain     Avoid any activities that cause pain. Rest when you have an attack of pain. Try gentle massage to relieve pain. Try a different pillow or sleeping position. If directed, apply heat to the affected area as often as told by your health care provider. Use the heat source that your health care provider recommends, such as a moist heat pack or a heating pad. Place a towel between your skin and the heat source. Leave the heat on for 20-30 minutes. Remove the heat if your skin turns bright red. This is especially important if you are unable to feel pain, heat, or cold. You have a greater risk of getting burned.  If directed, put ice on the back of your head and neck area. To do this: Put ice in a plastic bag. Place a towel between your skin and the bag. Leave the ice on for 20 minutes, 2-3 times a day. Remove the ice if your skin turns bright red. This is very important. If you cannot feel pain,  heat, or cold, you have a greater risk of damage to the area. General instructions Take over-the-counter and prescription medicines only as told by your health care provider. Avoid things that make your symptoms worse, such as bright lights. Try to stay active. Get regular exercise that does not cause pain. Ask your health care provider to suggest safe exercises for you. Work with a physical therapist to learn stretching exercises you can do at home. Practice good posture. Keep all follow-up visits. This is important. Contact a health care provider if: Your medicine is not working. You have new or worsening symptoms. Get help right away if: You have very bad head pain that does not go away. You have a sudden change in vision, balance, or speech. These symptoms may represent a serious problem that is an emergency. Do not wait to see if the symptoms will go away. Get medical help right away. Call your local emergency services (911 in the [place] Do not drive yourself to the hospital. Summary Occipital neuralgia is a type of headache that causes brief episodes of very bad pain in the back of the head. Pain from occipital neuralgia may spread (radiate) to other parts of the head. Treatment for this condition includes rest, massage, and medicines. This information is not intended to replace advice given to you by your health care provider. Make sure you discuss any questions you have with your health care provider. Document Revised: 11/21/2019 Document Reviewed: 11/21/2019 Elsevier Patient Education  2023 [employer]  Occipital Nerve Block Patient Information  Description: The occipital nerves originate in the cervical (neck) spinal cord and travel upward through muscle and tissue to supply sensation to the back of the head and top of the scalp.  In addition, the nerves control some of the muscles of the scalp.  Occipital neuralgia is an irritation of these nerves which can cause headaches,  numbness of the scalp, and neck discomfort.     The occipital nerve block will interrupt nerve transmission through these nerves and can relieve pain and spasm.  The block consists of insertion of a small needle under the skin in the back of the head to deposit local anesthetic (numbing medicine) and/or steroids around the nerve.  The entire block usually lasts less than 5 minutes.  Conditions which may be treated by occipital blocks:  Muscular pain and spasm of the scalp Nerve irritation, back of the head Headaches Upper neck pain  Possible side-effects:  Bleeding from needle site Infection (rare, may require surgery) Nerve injury (rare) Hair on back of neck can be tinged with iodine scrub (this will wash out) Light-headedness (temporary) Pain at injection site (several days) Decreased blood pressure (rare, temporary) Seizure (very rare)  Call if you experience:  Hives or difficulty breathing ( go to the emergency room) Inflammation or drainage at the injection site(s)  Please note:  Although the local anesthetic injected can often make your painful muscles or headache feel good for several hours after the injection, the pain may return.  It takes 3-7 days for steroids to work.  You may not notice any pain relief for at least one  week.  If effective, we will often do a series of injections spaced 3-6 weeks apart to maximally decrease your pain.  Occipital Neuralgia  Occipital neuralgia is a type of headache that causes brief episodes of very bad pain in the back of the head. Pain from occipital neuralgia may spread (radiate) to other parts of the head. These headaches may be caused by irritation of the nerves that leave the spinal cord high up in the neck, just below the base of the skull (occipital nerves). The occipital nerves transmit sensations from the back of the head, the top of the head, and the areas behind the ears. What are the causes? This condition can occur without  any known cause (primary headache syndrome). In other cases, this condition is caused by pressure on or irritation of one of the two occipital nerves. Pressure and irritation may be due to: Muscle spasm in the neck. Neck injury. Wear and tear of the vertebrae in the neck (osteoarthritis). Disease of the disks that separate the vertebrae. Swollen blood vessels that put pressure on the occipital nerves. Infections. Tumors. Diabetes. What are the signs or symptoms? This condition causes brief burning, stabbing, electric, shocking, or shooting pain in the back of the head that can radiate to the top of the head. It can happen on one side or both sides of the head. It can also cause: Pain behind the eye. Pain triggered by neck movement or hair brushing. Scalp tenderness. Aching in the back of the head between episodes of very bad pain. Pain that gets worse with exposure to bright lights. How is this diagnosed? Your health care provider may diagnose the condition based on a physical exam and your symptoms. Tests may be done, such as: Imaging studies of the brain and neck (cervical spine), such as an MRI or CT scan. These look for causes of pinched nerves. Applying pressure to the nerves in the neck to try to re-create the pain. Injection of numbing medicine into the occipital nerve areas to see if pain goes away (diagnostic nerve block). How is this treated? Treatment for this condition may begin with simple measures, such as: Rest. Massage. Applying heat or cold to the area. Over-the-counter pain relievers. If these measures do not work, you may need other treatments, including: Medicines, such as: Prescription-strength anti-inflammatory medicines. Muscle relaxants. Anti-seizure medicines, which can relieve pain. Antidepressants, which can relieve pain. Injected medicines, such as medicines that numb the area (local anesthetic) and steroids. Pulsed radiofrequency ablation. This is when  wires are implanted to deliver electrical impulses that block pain signals from the occipital nerve. Surgery to relieve nerve pressure. Physical therapy. Follow these instructions at home: Managing pain     Avoid any activities that cause pain. Rest when you have an attack of pain. Try gentle massage to relieve pain. Try a different pillow or sleeping position. If directed, apply heat to the affected area as often as told by your health care provider. Use the heat source that your health care provider recommends, such as a moist heat pack or a heating pad. Place a towel between your skin and the heat source. Leave the heat on for 20-30 minutes. Remove the heat if your skin turns bright red. This is especially important if you are unable to feel pain, heat, or cold. You have a greater risk of getting burned. If directed, put ice on the back of your head and neck area. To do this: Put ice in a plastic bag.  Place a towel between your skin and the bag. Leave the ice on for 20 minutes, 2-3 times a day. Remove the ice if your skin turns bright red. This is very important. If you cannot feel pain, heat, or cold, you have a greater risk of damage to the area. General instructions Take over-the-counter and prescription medicines only as told by your health care provider. Avoid things that make your symptoms worse, such as bright lights. Try to stay active. Get regular exercise that does not cause pain. Ask your health care provider to suggest safe exercises for you. Work with a physical therapist to learn stretching exercises you can do at home. Practice good posture. Keep all follow-up visits. This is important. Contact a health care provider if: Your medicine is not working. You have new or worsening symptoms. Get help right away if: You have very bad head pain that does not go away. You have a sudden change in vision, balance, or speech. These symptoms may represent a serious problem that  is an emergency. Do not wait to see if the symptoms will go away. Get medical help right away. Call your local emergency services (911 in the [place] Do not drive yourself to the hospital. Summary Occipital neuralgia is a type of headache that causes brief episodes of very bad pain in the back of the head. Pain from occipital neuralgia may spread (radiate) to other parts of the head. Treatment for this condition includes rest, massage, and medicines. This information is not intended to replace advice given to you by your health care provider. Make sure you discuss any questions you have with your health care provider. Document Revised: 11/21/2019 Document Reviewed: 11/21/2019 Elsevier Patient Education  2023 [employer].

## 2021-10-03 NOTE — Patient Instructions (Addendum)
Medication Instructions:  Your physician has recommended you make the following change in your medication:   1) START Spironolactone 25mg  daily 2) START Clopidogrel (Plavix) 75mg  daily 3) INCREASE Rosuvastatin (Crestor) to 5mg  daily  *If you need a refill on your cardiac medications before your next appointment, please call your pharmacy*   Lab Work: In 10-14 days: BMET In 4-6 weeks at next office visit: fasting Lipid panel, Hepatic panel  If you have labs (blood work) drawn today and your tests are completely normal, you will receive your results only by: MyChart Message (if you have MyChart) OR A paper copy in the mail If you have any lab test that is abnormal or we need to change your treatment, we will call you to review the results.  Testing/Procedures: NONE  Follow-Up: At [facility], you and your health needs are our priority.  As part of our continuing mission to provide you with exceptional heart care, we have created designated Provider Care Teams.  These Care Teams include your primary Cardiologist (physician) and Advanced Practice Providers (APPs -  Physician Assistants and Nurse Practitioners) who all work together to provide you with the care you need, when you need it.  Your next appointment:   1 month(s)  The format for your next appointment:   In Person  Provider:   , MD    Important Information About Sugar

## 2021-10-03 NOTE — Progress Notes (Signed)
PATIENT: Syretta Kochel DOB: 1945/09/21  REASON FOR VISIT: follow up HISTORY FROM: patient  Chief Complaint  Patient presents with   RM 4    Here alone for pain in the back, lower part of her head. She saw Aundra Millet in April and went to therapy. Her neck is a little better but not her head.    Follow up 10/03/2021: Dr Lucia Gaskins; She went to PT for her neck pain but she has decreased range of motion, stiffness, soreness on the right at the base on the neck, and radiates down the right side, can't move neck side to side, reduced, hurts to extend and flex, she completed PT without any relief, started 8 months ago, getting worse, she has rheumatoid arthritis. Order an MRI cervical spine. Also has osteoarthritis. She is having changes in bowel and bladder. She had a fall.   Patient complains of symptoms per HPI as well as the following symptoms: chronic neck pain . Pertinent negatives and positives per HPI. All others negative   HISTORY OF PRESENT ILLNESS: Today 05/09/2021 Butch Penny  Ms. Trnka is a 76 year old female. She returns today for follow-up. Continues on Topamax 50 mg daily. Only had two migraines in the last year. May get vertigo if she lays back to quick. This only happens on occasion. Currently happy with treatment  Reports that 4 months ago she noticed neck stiffness. She reports no known injuries to the neck. She has to sleep on her back. Reports that her balance is a little off- feels like she may fall backwards. Pain in the neck is on both sides but only radiates to the back of the head on the right. She is unsure if its due to her sleep. Not tried  neck exercises. Takes hydrocodone already for pain. Has not tried anything else for the pain.  05/17/20: Ms. Ghazarian is a 76 year old female with a history of vestibular migraines.  She returns today for follow-up.  She is currently on Topamax 50 mg daily.  She reports that this works fairly well for her.  She states over the last  year she is only had a couple of vestibular migraines.  She reports that occasionally she will have episodes of vertigo but this is typically quick and occurs when she is laying back in bed.  She denies any new symptoms.  She returns today for an evaluation.  04/27/19: Ms. Ketner is a 76 year old female with a history of vestibular migraines.  She reports that she has done well on Qudexy.  She reports her insurance no longer covers this medication.  Since she has been taking it she is only had 1 vestibular migraine.  She states that she typically does not have any dizziness unless she has a migraine.  She would like to switch back to Topamax that she originally started with this medication and it worked well for her.  HISTORY   She continue to have vertigo. Migraines are improved, just one episode. She continues on the Topamax.  The new dizziness started 4 months ago. She was in PT for her hips and legs, when she laid back she noticed it. When she goes to bed and lay down that is when she feels it. Brief and the room spins, 1 minute, sitting still improves also dizzy in the morning. She is not drinking enough fluids.    HPI:  Jovee Dettinger is a 76 y.o. female here as a referral from Dr. Zachery Dauer  for dizziness. Medical history migraines(since  hypertension, hypercholesterolemia, spinal stenosis, diabetes, osteoarthritis and polyarthropathy, trochanteric bursitis, vitamin D deficiency, glaucoma, rheumatoid arthritis, spondylolisthesis.October 17 she woke up with severe spinning, nausea. Then it happened again January 29th worsening, she can incapacitated with a headache for 3 days. She has headaches at the temples with light sensitivity, nausea, pounding/throbbing, feels like her eyes are going to pop out. She has daily headache. She is taking 6-8 extra strength excedrin every day. Worse in the morning and at night. Headaches wake her up. She has gained 14 pounds since July. Blurry vision and vertigo.  Severe, continuous, daily headaches. She has neck pain.No other focal neurologic deficits, associated symptoms, inciting events or modifiable factors.   Reviewed notes, labs and imaging from outside physicians, which showed:   CT head showed No acute intracranial abnormalities including mass lesion or mass effect, hydrocephalus, extra-axial fluid collection, midline shift, hemorrhage, or acute infarction, large ischemic events (personally reviewed images)    REVIEW OF SYSTEMS: Out of a complete 14 system review of symptoms, the patient complains only of the following symptoms, and all other reviewed systems are negative.   See HPI  ALLERGIES: Allergies  Allergen Reactions   Sulfa Antibiotics Other (See Comments)    Don't remember     HOME MEDICATIONS: Outpatient Medications Prior to Visit  Medication Sig Dispense Refill   ACCU-CHEK GUIDE test strip as needed.     Accu-Chek Softclix Lancets lancets      acetaminophen (TYLENOL) 325 MG tablet Take 2 tablets (650 mg total) by mouth every 6 (six) hours as needed for mild pain (pain score 1-3 or temp > 100.5). 120 tablet 1   cetirizine (ZYRTEC) 10 MG tablet Take 10 mg by mouth daily.      Cholecalciferol (VITAMIN D) 50 MCG (2000 UT) tablet Take 2,000 Units by mouth daily.      docusate sodium (COLACE) 100 MG capsule Take 1 capsule (100 mg total) by mouth 2 (two) times daily. 60 capsule 1   DULoxetine (CYMBALTA) 30 MG capsule Take 60 mg by mouth daily.      EPIPEN 2-PAK 0.3 MG/0.3ML SOAJ injection Inject 0.3 mg into the muscle daily as needed. For anaphylactic allergic reaction  1   ezetimibe (ZETIA) 10 MG tablet Take 10 mg by mouth daily.     fenofibrate 54 MG tablet Take 54 mg by mouth daily.     folic acid (FOLVITE) 1 MG tablet Take 1 mg by mouth daily.     HYDROcodone-acetaminophen (NORCO) 7.5-325 MG tablet Take 1-4 tablets by mouth every 6 (six) hours as needed for moderate pain.     hydrOXYzine (ATARAX/VISTARIL) 25 MG tablet Take 25  mg by mouth daily.  1   latanoprost (XALATAN) 0.005 % ophthalmic solution Place 1 drop into both eyes at bedtime.      NON FORMULARY Inject 1 Syringe into the skin 2 (two) times a week. Allergy Shots     pioglitazone (ACTOS) 30 MG tablet Take 30 mg by mouth daily.     rosuvastatin (CRESTOR) 5 MG tablet 5 mg. Take Monday, Wednesday ,friday     Tocilizumab (ACTEMRA) 200 MG/10ML SOLN      topiramate (TOPAMAX) 50 MG tablet Take 1 tablet (50 mg total) by mouth at bedtime. 90 tablet 3   empagliflozin (JARDIANCE) 10 MG TABS tablet Take 1 tablet (10 mg total) by mouth daily before breakfast. (Patient not taking: Reported on 10/03/2021) 30 tablet 11   metoprolol tartrate (LOPRESSOR) 100 MG  tablet Take 1 tablet (100 mg total) by mouth once for 1 dose. Take 2 hours prior to CT scan. (Patient not taking: Reported on 10/03/2021) 1 tablet 0   No facility-administered medications prior to visit.    PAST MEDICAL HISTORY: Past Medical History:  Diagnosis Date   Chronic fatigue    Depression    Diabetes mellitus without complication (HCC)    Early cataracts, bilateral    Fibromyalgia    Glaucoma    Headache    Vestibular migraines   History of bronchitis    History of pneumonia    Hypercholesterolemia    RA (rheumatoid arthritis) (HCC)    RA (rheumatoid arthritis) (HCC)    TIA (transient ischemic attack)    Vertigo    Wears glasses     PAST SURGICAL HISTORY: Past Surgical History:  Procedure Laterality Date   APPENDECTOMY     BACK SURGERY  2012, 2017   BILATERAL CARPAL TUNNEL RELEASE     CESAREAN SECTION     x3   DILATION AND CURETTAGE OF UTERUS     GLAUCOMA SURGERY     KNEE ARTHROSCOPY Left    MOUTH SURGERY     TONSILLECTOMY     TOTAL HIP ARTHROPLASTY Right 01/30/2017   Procedure: RIGHT TOTAL HIP ARTHROPLASTY ANTERIOR APPROACH;  Surgeon: Kathryne Hitch, MD;  Location: [facility];  Service: Orthopedics;  Laterality: Right;   TOTAL HIP REVISION Right 07/28/2019   Procedure: TOTAL HIP  REVISION, posterior approach femoal comp;  Surgeon: Samson Frederic, MD;  Location: [facility];  Service: Orthopedics;  Laterality: Right;    FAMILY HISTORY: Family History  Problem Relation Age of Onset   Diabetes Mother    Stroke Mother    Heart disease Mother    High blood pressure Father    Stroke Father    Diabetes Father    Heart disease Father    Heart disease Sister    Migraines Neg Hx     SOCIAL HISTORY: Social History   Socioeconomic History   Marital status: Widowed    Spouse name: Not on file   Number of children: 4   Years of education: 16   Highest education level: Not on file  Occupational History   Occupation: Retired    Comment: Runner, broadcasting/film/video  Tobacco Use   Smoking status: Never   Smokeless tobacco: Never  Vaping Use   Vaping Use: Never used  Substance and Sexual Activity   Alcohol use: No   Drug use: No   Sexual activity: Not on file  Other Topics Concern   Not on file  Social History Narrative   Lives at home alone   Right-handed   Caffeine: tea daily   Has 10 grandchildren   Social Determinants of Health   Financial Resource Strain: Not on file  Food Insecurity: Not on file  Transportation Needs: Not on file  Physical Activity: Not on file  Stress: Not on file  Social Connections: Not on file  Intimate Partner Violence: Not on file      PHYSICAL EXAM  Vitals:   10/03/21 1051  BP: 135/62  Pulse: 74  Weight: 193 lb (87.5 kg)  Height: 4\' 11"  (1.499 m)   Body mass index is 38.98 kg/m.  Exam: NAD, pleasant        MSK: Point tenderness at emergence of the right occipital nerve and decreased range of motion of the neck in all directions  Speech:    Speech is normal; fluent and spontaneous with normal comprehension.  Cognition:    The patient is oriented to person, place, and time;     recent and remote memory intact;     language fluent;    Cranial Nerves:    The pupils are equal, round, and reactive to light.Trigeminal  sensation is intact and the muscles of mastication are normal. The face is symmetric. The palate elevates in the midline. Hearing intact. Voice is normal. Shoulder shrug is normal. The tongue has normal motion without fasciculations.   Coordination:  No dysmetria  Motor Observation:    No asymmetry, no atrophy, and no involuntary movements noted. Tone:    Normal muscle tone.     Strength: weakness proximal right arm, otherwise strength is V/V in the upper and lower limbs.      Sensation: intact to LT  Gait: walker, wide based, stooped, antalgic       DIAGNOSTIC DATA (LABS, IMAGING, TESTING) - I reviewed patient records, labs, notes, testing and imaging myself where available.  Lab Results  Component Value Date   WBC 7.4 09/14/2019   HGB 10.8 (L) 09/14/2019   HCT 35.6 (L) 09/14/2019   MCV 96.5 09/14/2019   PLT 374 09/14/2019      Component Value Date/Time   NA 139 06/20/2021 1041   K 4.6 06/20/2021 1041   CL 100 06/20/2021 1041   CO2 26 06/20/2021 1041   GLUCOSE 125 (H) 06/20/2021 1041   GLUCOSE 165 (H) 09/14/2019 1752   BUN 15 06/20/2021 1041   CREATININE 0.91 06/20/2021 1041   CREATININE 0.84 09/08/2019 1323   CALCIUM 10.3 06/20/2021 1041   PROT 7.1 06/20/2021 1041   ALBUMIN 4.5 06/20/2021 1041   AST 25 06/20/2021 1041   ALT 16 06/20/2021 1041   ALKPHOS 70 06/20/2021 1041   BILITOT 0.7 06/20/2021 1041   GFRNONAA >60 09/14/2019 1752   GFRAA >60 09/14/2019 1752   Lab Results  Component Value Date   CHOL 143 06/20/2021   HDL 57 06/20/2021   LDLCALC 68 06/20/2021   TRIG 98 06/20/2021   CHOLHDL 2.5 06/20/2021   Lab Results  Component Value Date   HGBA1C 6.7 (H) 07/20/2019      ASSESSMENT AND PLAN 76 y.o. year old female  has a past medical history of Chronic fatigue, Depression, Diabetes mellitus without complication (HCC), Early cataracts, bilateral, Fibromyalgia, Glaucoma, Headache, History of bronchitis, History of pneumonia, Hypercholesterolemia,  RA (rheumatoid arthritis) (HCC), RA (rheumatoid arthritis) (HCC), TIA (transient ischemic attack), Vertigo, and Wears glasses. here with:  MRI cervical Spine: She went to PT for her neck pain but she still has decreased range of motion, stiffness, soreness on the right at the base on the neck, and radiates down the right side, can't move neck side to side, reduced, hurts to extend and flex, she completed PT without any relief, started 8 months ago, getting worse, she has rheumatoid arthritis, right prox arm weakness. Also has osteoarthritis. She is having changes in bowel and bladder. She had a fall. Consider occipital nerve blocks will see what the MRI cervical spine shows first per patient preference.   PLEASE COMMENT ON and changes at C2/C3 she has occipital neuralgia, comment on facet disease, or any other changes so we can get injections if needed  Orders Placed This Encounter  Procedures   MR CERVICAL SPINE WO CONTRAST    1.  Vestibular migraine  -Continue Topamax 50 mg at bedtime -Advised if  headache frequency or dizziness increases she should let us know  Naomie Dean, MD [facility] [address], [address] [place], [place] [address] [phone]  I spent over 30 minutes of face-to-face and non-face-to-face time with patient on the  1. Cervical radiculopathy   2. Chronic neck pain   3. Fall, initial encounter   4. Imbalance   5. Decreased range of motion of intervertebral discs of cervical spine   6. Right arm weakness   7. Alteration in bowel and bladder function   8. Crepitus of cervical spine   9. Rheumatoid arthritis involving vertebra, unspecified whether rheumatoid factor present (HCC)    diagnosis.  This included previsit chart review, lab review, study review, order entry, electronic health record documentation, patient education on the different diagnostic and therapeutic options, counseling and coordination of care, risks and benefits of management,  compliance, or risk factor reduction

## 2021-10-09 ENCOUNTER — Ambulatory Visit: Payer: Medicare PPO | Admitting: Adult Health

## 2021-10-10 ENCOUNTER — Ambulatory Visit: Payer: Medicare PPO | Attending: Physician Assistant | Admitting: Physician Assistant

## 2021-10-10 VITALS — BP 140/80 | HR 100 | Ht 59.0 in | Wt 185.8 lb

## 2021-10-10 DIAGNOSIS — E785 Hyperlipidemia, unspecified: Secondary | ICD-10-CM | POA: Diagnosis not present

## 2021-10-10 DIAGNOSIS — R5383 Other fatigue: Secondary | ICD-10-CM | POA: Diagnosis not present

## 2021-10-10 DIAGNOSIS — I2510 Atherosclerotic heart disease of native coronary artery without angina pectoris: Secondary | ICD-10-CM

## 2021-10-10 DIAGNOSIS — I5032 Chronic diastolic (congestive) heart failure: Secondary | ICD-10-CM

## 2021-10-10 DIAGNOSIS — M79605 Pain in left leg: Secondary | ICD-10-CM

## 2021-10-10 NOTE — Progress Notes (Signed)
Cardiology Office Note:    Date:  10/10/2021   ID:  [name], DOB [birth year], MRN [id number]  PCP:  Clayborn Heron, MD  [facility] Providers Cardiologist:  Lesleigh Noe, MD     Referring MD: Clayborn Heron, MD   Chief Complaint:  No chief complaint on file.     History of Present Illness:   [name] is a 76 y.o. female  with a hx of chronic diastolic heart failure, coronary calcium score 1100, aortic atherosclerosis, DM II, TIA, estrogen deficiency, rheumatoid arthritis, fibromyalgia, spinal stenosis, statin intolerance, and hyperlipidemia.   On Jardiance she had significant nausea vomiting and diarrhea and stopped the medication.   Patient saw Dr. Katrinka Blazing 10/03/21 with LE edema and DOE and started on spironolactone. Rosuvastatin increased 5 mg daily.  Patient has lost 7 lbs since and edema improved since spironolactone. Patient wanted to be seen because she feels terrible-so fatigued, miserable, weak, no energy.  She thinks she's overmedicated. Has tender legs-hurts to shave.         Past Medical History:  Diagnosis Date   Chronic fatigue    Depression    Diabetes mellitus without complication (HCC)    Early cataracts, bilateral    Fibromyalgia    Glaucoma    Headache    Vestibular migraines   History of bronchitis    History of pneumonia    Hypercholesterolemia    RA (rheumatoid arthritis) (HCC)    RA (rheumatoid arthritis) (HCC)    TIA (transient ischemic attack)    Vertigo    Wears glasses    Current Medications: Current Meds  Medication Sig   ACCU-CHEK GUIDE test strip as needed.   Accu-Chek Softclix Lancets lancets    acetaminophen (TYLENOL) 325 MG tablet Take 2 tablets (650 mg total) by mouth every 6 (six) hours as needed for mild pain (pain score 1-3 or temp > 100.5).   cetirizine (ZYRTEC) 10 MG tablet Take 10 mg by mouth daily.    Cholecalciferol (VITAMIN D) 50 MCG (2000 UT) tablet Take 2,000 Units by mouth  daily.    clopidogrel (PLAVIX) 75 MG tablet Take 1 tablet (75 mg total) by mouth daily.   docusate sodium (COLACE) 100 MG capsule Take 1 capsule (100 mg total) by mouth 2 (two) times daily.   DULoxetine (CYMBALTA) 30 MG capsule Take 60 mg by mouth daily.    EPIPEN 2-PAK 0.3 MG/0.3ML SOAJ injection Inject 0.3 mg into the muscle daily as needed. For anaphylactic allergic reaction   ezetimibe (ZETIA) 10 MG tablet Take 10 mg by mouth daily.   fenofibrate 54 MG tablet Take 54 mg by mouth daily.   folic acid (FOLVITE) 1 MG tablet Take 1 mg by mouth daily.   HYDROcodone-acetaminophen (NORCO) 7.5-325 MG tablet Take 1-4 tablets by mouth every 6 (six) hours as needed for moderate pain.   hydrOXYzine (ATARAX/VISTARIL) 25 MG tablet Take 25 mg by mouth daily.   latanoprost (XALATAN) 0.005 % ophthalmic solution Place 1 drop into both eyes at bedtime.    methotrexate (RHEUMATREX) 2.5 MG tablet Take 10 tablets by mouth once a week.   NON FORMULARY Inject 1 Syringe into the skin 2 (two) times a week. Allergy Shots   pioglitazone (ACTOS) 30 MG tablet Take 30 mg by mouth daily.   rosuvastatin (CRESTOR) 5 MG tablet Take 1 tablet (5 mg total) by mouth daily.   spironolactone (ALDACTONE) 25 MG tablet Take 1 tablet (25 mg total) by  mouth daily.   Tocilizumab (ACTEMRA) 200 MG/10ML SOLN    topiramate (TOPAMAX) 50 MG tablet Take 1 tablet (50 mg total) by mouth at bedtime.    Allergies:   Leflunomide and Sulfa antibiotics   Social History   Tobacco Use   Smoking status: Never   Smokeless tobacco: Never  Vaping Use   Vaping Use: Never used  Substance Use Topics   Alcohol use: No   Drug use: No    Family Hx: The patient's family history includes Diabetes in her father and mother; Heart disease in her father, mother, and sister; High blood pressure in her father; Stroke in her father and mother. There is no history of Migraines.  ROS   EKGs/Labs/Other Test Reviewed:    EKG:  EKG is  not ordered today.     Recent Labs: 06/20/2021: ALT 16; BUN 15; Creatinine, Ser 0.91; Potassium 4.6; Sodium 139   Recent Lipid Panel Recent Labs    06/20/21 1040  CHOL 143  TRIG 98  HDL 57  LDLCALC 68     Prior CV Studies:   Coronary CTA July 2023: IMPRESSION: 1. Coronary calcium score of 1133. This was 95th percentile for age-, race-, and sex-matched controls.   2. Normal coronary origin with right dominance.   3. Moderate (50-69%) calcified plaque in the proximal to mid LAD. Will send for FFRct.   4.  Aortic atherosclerosis   Coronary CT FFR: July 2023 IMPRESSION: 1.  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   2. LAD is heavily calcified. Recommend aggressive risk factor modification, including LDL goal <70.     2D Doppler echocardiogram/11/2021: IMPRESSIONS   1. Left ventricular ejection fraction, by estimation, is 60 to 65%. The  left ventricle has normal function. The left ventricle has no regional  wall motion abnormalities. There is mild left ventricular hypertrophy of  the basal-septal segment. Left  ventricular diastolic parameters are consistent with Grade I diastolic  dysfunction (impaired relaxation).   2. Right ventricular systolic function is normal. The right ventricular  size is mildly enlarged. There is normal pulmonary artery systolic  pressure.   3. Left atrial size was severely dilated.   4. No evidence of mitral valve regurgitation.   5. The aortic valve was not well visualized. Aortic valve regurgitation  is not visualized.   6. The inferior vena cava is normal in size with greater than 50%  respiratory variability, suggesting right atrial pressure of 3 mmHg.       Risk Assessment/Calculations/Metrics:            Physical Exam:    VS:  BP (!) 140/80 (BP Location: Left Arm, Patient Position: Sitting, Cuff Size: Large)   Pulse 100   Ht 4\' 11"  (1.499 m)   Wt 185 lb 12.8 oz (84.3 kg)   SpO2 98%   BMI 37.53 kg/m     Wt Readings  from Last 3 Encounters:  10/10/21 185 lb 12.8 oz (84.3 kg)  10/03/21 191 lb (86.6 kg)  10/03/21 193 lb (87.5 kg)    Physical Exam  GEN: Obese, in no acute distress  Neck: no JVD, carotid bruits, or masses Cardiac:RRR; no murmurs, rubs, or gallops  Respiratory:  clear to auscultation bilaterally, normal work of breathing GI: soft, nontender, nondistended, + BS Ext: tender left lower ext otherwise without cyanosis, clubbing, or edema, Good distal pulses bilaterally Neuro:  Alert and Oriented x 3, Psych: euthymic mood, full affect       ASSESSMENT &  PLAN:   No problem-specific Assessment & Plan notes found for this encounter.   Chronic diastolic CHF with LE edema started on spironolactone-has lost 7 lbs and doing better.  CAD calcium score >1100 on Plavix monotherapy because of interaction methotrexate and ASA  HLD crestor increased 5 mg daily for goal LDL less than 55. Has had M-S pain on statins in the past.  Fatigue, weakness, aches all over.  Will check labs and have her discuss with PCP next week.  Left leg pain/tenderness- no swelling or erythema. she's concerned about blood clot-will order doppler.           Dispo:  No follow-ups on file.   Medication Adjustments/Labs and Tests Ordered: Current medicines are reviewed at length with the patient today.  Concerns regarding medicines are outlined above.  Tests Ordered: Orders Placed This Encounter  Procedures   Comprehensive metabolic panel   CBC   TSH   VAS [place] LOWER EXTREMITY VENOUS (DVT)   Medication Changes: No orders of the defined types were placed in this encounter.  Signed, Ermalinda Barrios, PA-C  10/10/2021 10:01 AM    [address] [place], [place], [place]  [address] Phone: [phone]; Fax: [phone]

## 2021-10-10 NOTE — Patient Instructions (Signed)
Medication Instructions:  Your physician recommends that you continue on your current medications as directed. Please refer to the Current Medication list given to you today.  *If you need a refill on your cardiac medications before your next appointment, please call your pharmacy*   Lab Work: TODAY: CMET, CBC, TSH  If you have labs (blood work) drawn today and your tests are completely normal, you will receive your results only by: MyChart Message (if you have MyChart) OR A paper copy in the mail If you have any lab test that is abnormal or we need to change your treatment, we will call you to review the results.   Testing/Procedures: Your physician has requested that you have a lower extremity arterial duplex. During this test, ultrasound are used to evaluate arterial blood flow in the legs. Allow one hour for this exam. There are no restrictions or special instructions.   Follow-Up: At [facility], you and your health needs are our priority.  As part of our continuing mission to provide you with exceptional heart care, we have created designated Provider Care Teams.  These Care Teams include your primary Cardiologist (physician) and Advanced Practice Providers (APPs -  Physician Assistants and Nurse Practitioners) who all work together to provide you with the care you need, when you need it.   Your next appointment:   As scheduled

## 2021-10-11 ENCOUNTER — Ambulatory Visit (HOSPITAL_COMMUNITY)
Admission: RE | Admit: 2021-10-11 | Payer: Medicare PPO | Source: Ambulatory Visit | Attending: Physician Assistant | Admitting: Physician Assistant

## 2021-10-11 LAB — COMPREHENSIVE METABOLIC PANEL
ALT: 20 IU/L (ref 0–32)
AST: 17 IU/L (ref 0–40)
Albumin/Globulin Ratio: 2 (ref 1.2–2.2)
Albumin: 4.7 g/dL (ref 3.8–4.8)
Alkaline Phosphatase: 93 IU/L (ref 44–121)
BUN/Creatinine Ratio: 22 (ref 12–28)
BUN: 24 mg/dL (ref 8–27)
Bilirubin Total: 0.8 mg/dL (ref 0.0–1.2)
CO2: 24 mmol/L (ref 20–29)
Calcium: 10.5 mg/dL — ABNORMAL HIGH (ref 8.7–10.3)
Chloride: 98 mmol/L (ref 96–106)
Creatinine, Ser: 1.09 mg/dL — ABNORMAL HIGH (ref 0.57–1.00)
Globulin, Total: 2.3 g/dL (ref 1.5–4.5)
Glucose: 157 mg/dL — ABNORMAL HIGH (ref 70–99)
Potassium: 4.5 mmol/L (ref 3.5–5.2)
Sodium: 133 mmol/L — ABNORMAL LOW (ref 134–144)
Total Protein: 7 g/dL (ref 6.0–8.5)
eGFR: 53 mL/min/{1.73_m2} — ABNORMAL LOW (ref >59)

## 2021-10-11 LAB — CBC
Hematocrit: 43.1 % (ref 34.0–46.6)
Hemoglobin: 14.1 g/dL (ref 11.1–15.9)
MCH: 32.3 pg (ref 26.6–33.0)
MCHC: 32.7 g/dL (ref 31.5–35.7)
MCV: 99 fL — ABNORMAL HIGH (ref 79–97)
Platelets: 339 10*3/uL (ref 150–450)
RBC: 4.36 x10E6/uL (ref 3.77–5.28)
RDW: 13.1 % (ref 11.7–15.4)
WBC: 6.3 10*3/uL (ref 3.4–10.8)

## 2021-10-11 LAB — TSH: TSH: 1.14 u[IU]/mL (ref 0.450–4.500)

## 2021-10-12 ENCOUNTER — Telehealth: Payer: Self-pay | Admitting: Neurology

## 2021-10-12 NOTE — Telephone Encounter (Signed)
Francine Graven Berkley Harvey: [id number] exp. 10/12/21-11/11/21 sent to GI

## 2021-10-15 ENCOUNTER — Ambulatory Visit (HOSPITAL_COMMUNITY)
Admission: RE | Admit: 2021-10-15 | Discharge: 2021-10-15 | Disposition: A | Discharge status: Home / Self Care | Payer: Medicare PPO | Source: Ambulatory Visit | Attending: Physician Assistant | Admitting: Physician Assistant

## 2021-10-15 DIAGNOSIS — M79605 Pain in left leg: Secondary | ICD-10-CM | POA: Diagnosis present

## 2021-10-16 ENCOUNTER — Ambulatory Visit: Payer: Medicare PPO | Admitting: Interventional Cardiology

## 2021-10-16 ENCOUNTER — Other Ambulatory Visit: Payer: Medicare PPO

## 2021-10-23 ENCOUNTER — Ambulatory Visit
Admission: RE | Admit: 2021-10-23 | Discharge: 2021-10-23 | Disposition: A | Discharge status: Home / Self Care | Payer: Medicare PPO | Source: Ambulatory Visit | Attending: Neurology | Admitting: Neurology

## 2021-10-23 DIAGNOSIS — R3989 Other symptoms and signs involving the genitourinary system: Secondary | ICD-10-CM

## 2021-10-23 DIAGNOSIS — R29898 Other symptoms and signs involving the musculoskeletal system: Secondary | ICD-10-CM

## 2021-10-23 DIAGNOSIS — M5412 Radiculopathy, cervical region: Secondary | ICD-10-CM

## 2021-10-23 DIAGNOSIS — W19XXXA Unspecified fall, initial encounter: Secondary | ICD-10-CM

## 2021-10-23 DIAGNOSIS — R2689 Other abnormalities of gait and mobility: Secondary | ICD-10-CM | POA: Diagnosis not present

## 2021-10-23 DIAGNOSIS — M2480 Other specific joint derangements of unspecified joint, not elsewhere classified: Secondary | ICD-10-CM

## 2021-10-23 DIAGNOSIS — M459 Ankylosing spondylitis of unspecified sites in spine: Secondary | ICD-10-CM

## 2021-10-23 DIAGNOSIS — M5382 Other specified dorsopathies, cervical region: Secondary | ICD-10-CM

## 2021-10-23 DIAGNOSIS — M542 Cervicalgia: Secondary | ICD-10-CM | POA: Diagnosis not present

## 2021-10-23 DIAGNOSIS — G8929 Other chronic pain: Secondary | ICD-10-CM

## 2021-10-29 ENCOUNTER — Telehealth: Payer: Self-pay | Admitting: Neurology

## 2021-10-29 DIAGNOSIS — G8929 Other chronic pain: Secondary | ICD-10-CM

## 2021-10-29 NOTE — Telephone Encounter (Signed)
Pod 4 please discuss with patient please: The MRI of the cervical spine showed that you do have arthritis in your neck, nothing that would necessitate surgery but arthritis in the joints of the neck can cause a lot of pain.  The spinal cord appears normal.  At this time we could send you for injections into the neck and see if that helps.  Please let us know and we can send you to someone who may be able to help you with that.  (Would send to Dr. Nelva Bush unless she already has a relationship with another ortho or neurosurgeon)

## 2021-10-30 NOTE — Telephone Encounter (Signed)
I called the pt and LVM (ok per DPR) asking for call back to discuss her MRI cervical spine result.

## 2021-10-31 NOTE — Telephone Encounter (Signed)
I called the pt and discussed her MRI cervical spine results as noted below by Dr Jaynee Eagles. Pt is leery of having injections into neck but she is amenable to a referral to Dr Nelva Bush for evaluation. She said she actually just saw him today for her hip and had fluid drawn off. She did ask about a nerve block that Dr Jaynee Eagles had discussed previously. Pt aware this would not be the same procedure as what Dr Nelva Bush would do and it would only be temporary and doesn't treat the inflammation from arthritis. Will run by Dr Jaynee Eagles. She had one scheduled but then it was decided that she would wait until after MRI. She states it hurts mostly at night and she can live with the pain. She verbalized appreciation for the call.

## 2021-11-02 NOTE — Progress Notes (Unsigned)
Office Visit    Patient Name: [name] Date of Encounter: 11/02/2021  PCP:  Aretta Nip, MD   [place]  Cardiologist:  Sinclair Grooms, MD  Advanced Practice Provider:  No care team member to display Electrophysiologist:  None    Chief Complaint    [name] is a 76 y.o. female with a past medical history significant for chronic diastolic heart failure, coronary calcium score 1100, aortic atherosclerosis, diabetes mellitus type 2, TIA, estrogen insufficiency, rheumatoid arthritis, fibromyalgia, spinal stenosis, statin intolerance, and hyperlipidemia presents today for 1 month follow-up appointment.  Of note, patient had significant nausea and vomiting and diarrhea on Jardiance.  Medication had been stopped.  Seen by Dr. Tamala Julian 10/03/2021 with lower extremity edema and DOE and started on spironolactone.  Rosuvastatin increased to 5 mg daily.  Last seen by Amie Portland, PA 10/10/2021.  Patient lost 7 pounds since seen in August.  Edema improved on spironolactone.  Patient wanted to be seen because she felt fatigued, miserable, weak with no energy.  She believes she was overmedicated.  Her legs were tender to the touch.  Labs were checked and she was set up with her primary care.  Today, she ***  Past Medical History    Past Medical History:  Diagnosis Date   Chronic fatigue    Depression    Diabetes mellitus without complication ([place])    Early cataracts, bilateral    Fibromyalgia    Glaucoma    Headache    Vestibular migraines   History of bronchitis    History of pneumonia    Hypercholesterolemia    RA (rheumatoid arthritis) ([place])    RA (rheumatoid arthritis) ([place])    TIA (transient ischemic attack)    Vertigo    Wears glasses    Past Surgical History:  Procedure Laterality Date   APPENDECTOMY     BACK SURGERY  2012, 2017   BILATERAL CARPAL TUNNEL RELEASE     CESAREAN SECTION     x3   DILATION AND  CURETTAGE OF UTERUS     GLAUCOMA SURGERY     KNEE ARTHROSCOPY Left    MOUTH SURGERY     TONSILLECTOMY     TOTAL HIP ARTHROPLASTY Right 01/30/2017   Procedure: RIGHT TOTAL HIP ARTHROPLASTY ANTERIOR APPROACH;  Surgeon: Mcarthur Rossetti, MD;  Location: [facility];  Service: Orthopedics;  Laterality: Right;   TOTAL HIP REVISION Right 07/28/2019   Procedure: TOTAL HIP REVISION, posterior approach femoal comp;  Surgeon: Rod Can, MD;  Location: [facility];  Service: Orthopedics;  Laterality: Right;    Allergies  Allergies  Allergen Reactions   Leflunomide Diarrhea   Sulfa Antibiotics Other (See Comments)    Don't remember      EKGs/Labs/Other Studies Reviewed:   The following studies were reviewed today:  Coronary CTA July 2023: IMPRESSION: 1. Coronary calcium score of 1133. This was 95th percentile for age-, race-, and sex-matched controls.   2. Normal coronary origin with right dominance.   3. Moderate (50-69%) calcified plaque in the proximal to mid LAD. Will send for FFRct.   4.  Aortic atherosclerosis   Coronary CT FFR: July 2023 IMPRESSION: 1.  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   2. LAD is heavily calcified. Recommend aggressive risk factor modification, including LDL goal <70.     2D Doppler echocardiogram/11/2021: IMPRESSIONS   1. Left ventricular ejection fraction, by estimation, is 60 to 65%. The  left ventricle has normal function. The left ventricle has no regional  wall motion abnormalities. There is mild left ventricular hypertrophy of  the basal-septal segment. Left  ventricular diastolic parameters are consistent with Grade I diastolic  dysfunction (impaired relaxation).   2. Right ventricular systolic function is normal. The right ventricular  size is mildly enlarged. There is normal pulmonary artery systolic  pressure.   3. Left atrial size was severely dilated.   4. No evidence of mitral valve regurgitation.   5. The  aortic valve was not well visualized. Aortic valve regurgitation  is not visualized.   6. The inferior vena cava is normal in size with greater than 50%  respiratory variability, suggesting right atrial pressure of 3 mmHg.       EKG:  EKG is *** ordered today.  The ekg ordered today demonstrates ***  Recent Labs: 10/10/2021: ALT 20; BUN 24; Creatinine, Ser 1.09; Hemoglobin 14.1; Platelets 339; Potassium 4.5; Sodium 133; TSH 1.140  Recent Lipid Panel    Component Value Date/Time   CHOL 143 06/20/2021 1040   TRIG 98 06/20/2021 1040   HDL 57 06/20/2021 1040   CHOLHDL 2.5 06/20/2021 1040   CHOLHDL 6.6 02/25/2007 0520   VLDL 29 02/25/2007 0520   LDLCALC 68 06/20/2021 1040    Risk Assessment/Calculations:  {Does this patient have ATRIAL FIBRILLATION?[phone]}  Home Medications   No outpatient medications have been marked as taking for the 11/05/21 encounter (Appointment) with Elgie Collard, PA-C.     Review of Systems   ***   All other systems reviewed and are otherwise negative except as noted above.  Physical Exam    VS:  There were no vitals taken for this visit. , BMI There is no height or weight on file to calculate BMI.  Wt Readings from Last 3 Encounters:  10/10/21 185 lb 12.8 oz (84.3 kg)  10/03/21 191 lb (86.6 kg)  10/03/21 193 lb (87.5 kg)     GEN: Well nourished, well developed, in no acute distress. HEENT: normal. Neck: Supple, no JVD, carotid bruits, or masses. Cardiac: ***RRR, no murmurs, rubs, or gallops. No clubbing, cyanosis, edema.  ***Radials/PT 2+ and equal bilaterally.  Respiratory:  ***Respirations regular and unlabored, clear to auscultation bilaterally. GI: Soft, nontender, nondistended. MS: No deformity or atrophy. Skin: Warm and dry, no rash. Neuro:  Strength and sensation are intact. Psych: Normal affect.  Assessment & Plan    Chronic diastolic CHF CAD, calcium score greater than 1100 Hyperlipidemia Fatigue, weakness, aches Left leg  pain/tenderness  No BP recorded.  {Refresh Note OR Click here to enter BP  :1}***      Disposition: Follow up {follow up:15908} with Sinclair Grooms, MD or APP.  Signed, Elgie Collard, PA-C 11/02/2021, 9:51 AM [place]

## 2021-11-05 ENCOUNTER — Encounter: Payer: Self-pay | Admitting: Physician Assistant

## 2021-11-05 ENCOUNTER — Ambulatory Visit: Payer: Medicare PPO | Attending: Physician Assistant | Admitting: Physician Assistant

## 2021-11-05 VITALS — BP 126/84 | HR 93 | Ht 59.0 in | Wt 182.8 lb

## 2021-11-05 DIAGNOSIS — E785 Hyperlipidemia, unspecified: Secondary | ICD-10-CM | POA: Diagnosis not present

## 2021-11-05 DIAGNOSIS — I5032 Chronic diastolic (congestive) heart failure: Secondary | ICD-10-CM

## 2021-11-05 DIAGNOSIS — R5383 Other fatigue: Secondary | ICD-10-CM

## 2021-11-05 DIAGNOSIS — I2510 Atherosclerotic heart disease of native coronary artery without angina pectoris: Secondary | ICD-10-CM

## 2021-11-05 DIAGNOSIS — M79605 Pain in left leg: Secondary | ICD-10-CM

## 2021-11-05 LAB — LIPID PANEL
Chol/HDL Ratio: 2.4 ratio (ref 0.0–4.4)
Cholesterol, Total: 149 mg/dL (ref 100–199)
HDL: 62 mg/dL (ref >39)
LDL Chol Calc (NIH): 68 mg/dL (ref 0–99)
Triglycerides: 105 mg/dL (ref 0–149)
VLDL Cholesterol Cal: 19 mg/dL (ref 5–40)

## 2021-11-05 LAB — HEPATIC FUNCTION PANEL
ALT: 19 IU/L (ref 0–32)
AST: 22 IU/L (ref 0–40)
Albumin: 4.8 g/dL (ref 3.8–4.8)
Alkaline Phosphatase: 49 IU/L (ref 44–121)
Bilirubin Total: 0.6 mg/dL (ref 0.0–1.2)
Bilirubin, Direct: 0.23 mg/dL (ref 0.00–0.40)
Total Protein: 7.1 g/dL (ref 6.0–8.5)

## 2021-11-05 NOTE — Addendum Note (Signed)
Addended by: Gildardo Griffes on: 11/05/2021 11:35 AM   Modules accepted: Orders

## 2021-11-05 NOTE — Telephone Encounter (Signed)
Referral faxed to Dr. Ramos at [facility], phone # [phone]. 

## 2021-11-05 NOTE — Telephone Encounter (Signed)
Called pt & LVM (ok per DPR) advising Dr Jaynee Eagles says she would just pursue the neck injections with Dr Nelva Bush and not the occipital nerve blocks. I left our office number for call back if needed and advised we would send referral to Dr Nelva Bush as previously discussed with pt.   Referral order placed to Dr Nelva Bush per v.o. Dr Jaynee Eagles

## 2021-11-05 NOTE — Patient Instructions (Signed)
Medication Instructions:  Your physician recommends that you continue on your current medications as directed. Please refer to the Current Medication list given to you today.  *If you need a refill on your cardiac medications before your next appointment, please call your pharmacy*   Lab Work: Fasting lipids and lft's today If you have labs (blood work) drawn today and your tests are completely normal, you will receive your results only by: [place] (if you have MyChart) OR A paper copy in the mail If you have any lab test that is abnormal or we need to change your treatment, we will call you to review the results.   Testing/Procedures: Your physician has requested that you have an echocardiogram prior to August 2024 appointment. Echocardiography is a painless test that uses sound waves to create images of your heart. It provides your doctor with information about the size and shape of your heart and how well your heart's chambers and valves are working. This procedure takes approximately one hour. There are no restrictions for this procedure.    Follow-Up: At [facility], you and your health needs are our priority.  As part of our continuing mission to provide you with exceptional heart care, we have created designated Provider Care Teams.  These Care Teams include your primary Cardiologist (physician) and Advanced Practice Providers (APPs -  Physician Assistants and Nurse Practitioners) who all work together to provide you with the care you need, when you need it.  Your next appointment:   10 month(s), with an ECHO prior  The format for your next appointment:   In Person  Provider:   Sinclair Grooms, MD    Important Information About Sugar

## 2021-11-07 ENCOUNTER — Telehealth: Payer: Self-pay | Admitting: Interventional Cardiology

## 2021-11-07 NOTE — Telephone Encounter (Signed)
Returned call to patient and discussed lab results.  Per Nicholes Rough, PA-C: Your HDL 62 which is excellent, LDL is 68 which is at goal.  Total cholesterol 149.  Your triglycerides are 105 which is also excellent.  Great results!  Continue current medications.  Patient verbalized understanding and expressed appreciation for call.

## 2021-11-07 NOTE — Telephone Encounter (Signed)
Patient called to get test results. 

## 2021-11-19 ENCOUNTER — Ambulatory Visit: Payer: Medicare PPO | Admitting: Adult Health

## 2021-12-05 DIAGNOSIS — J301 Allergic rhinitis due to pollen: Secondary | ICD-10-CM | POA: Diagnosis not present

## 2021-12-07 DIAGNOSIS — J3081 Allergic rhinitis due to animal (cat) (dog) hair and dander: Secondary | ICD-10-CM | POA: Diagnosis not present

## 2021-12-07 DIAGNOSIS — J301 Allergic rhinitis due to pollen: Secondary | ICD-10-CM | POA: Diagnosis not present

## 2021-12-07 DIAGNOSIS — J3089 Other allergic rhinitis: Secondary | ICD-10-CM | POA: Diagnosis not present

## 2021-12-08 DIAGNOSIS — M5030 Other cervical disc degeneration, unspecified cervical region: Secondary | ICD-10-CM | POA: Diagnosis not present

## 2021-12-13 DIAGNOSIS — J3089 Other allergic rhinitis: Secondary | ICD-10-CM | POA: Diagnosis not present

## 2021-12-13 DIAGNOSIS — J301 Allergic rhinitis due to pollen: Secondary | ICD-10-CM | POA: Diagnosis not present

## 2021-12-18 DIAGNOSIS — M0589 Other rheumatoid arthritis with rheumatoid factor of multiple sites: Secondary | ICD-10-CM | POA: Diagnosis not present

## 2021-12-18 DIAGNOSIS — Z79899 Other long term (current) drug therapy: Secondary | ICD-10-CM | POA: Diagnosis not present

## 2021-12-20 DIAGNOSIS — M797 Fibromyalgia: Secondary | ICD-10-CM | POA: Diagnosis not present

## 2021-12-20 DIAGNOSIS — Z6834 Body mass index (BMI) 34.0-34.9, adult: Secondary | ICD-10-CM | POA: Diagnosis not present

## 2021-12-20 DIAGNOSIS — M1991 Primary osteoarthritis, unspecified site: Secondary | ICD-10-CM | POA: Diagnosis not present

## 2021-12-20 DIAGNOSIS — M0579 Rheumatoid arthritis with rheumatoid factor of multiple sites without organ or systems involvement: Secondary | ICD-10-CM | POA: Diagnosis not present

## 2021-12-20 DIAGNOSIS — Z79899 Other long term (current) drug therapy: Secondary | ICD-10-CM | POA: Diagnosis not present

## 2021-12-20 DIAGNOSIS — M47812 Spondylosis without myelopathy or radiculopathy, cervical region: Secondary | ICD-10-CM | POA: Diagnosis not present

## 2021-12-20 DIAGNOSIS — R600 Localized edema: Secondary | ICD-10-CM | POA: Diagnosis not present

## 2021-12-20 DIAGNOSIS — E669 Obesity, unspecified: Secondary | ICD-10-CM | POA: Diagnosis not present

## 2021-12-25 DIAGNOSIS — J3081 Allergic rhinitis due to animal (cat) (dog) hair and dander: Secondary | ICD-10-CM | POA: Diagnosis not present

## 2021-12-25 DIAGNOSIS — J301 Allergic rhinitis due to pollen: Secondary | ICD-10-CM | POA: Diagnosis not present

## 2021-12-25 DIAGNOSIS — Z9181 History of falling: Secondary | ICD-10-CM | POA: Diagnosis not present

## 2021-12-25 DIAGNOSIS — E669 Obesity, unspecified: Secondary | ICD-10-CM | POA: Diagnosis not present

## 2021-12-25 DIAGNOSIS — J3089 Other allergic rhinitis: Secondary | ICD-10-CM | POA: Diagnosis not present

## 2021-12-25 DIAGNOSIS — E119 Type 2 diabetes mellitus without complications: Secondary | ICD-10-CM | POA: Diagnosis not present

## 2021-12-25 DIAGNOSIS — Z79899 Other long term (current) drug therapy: Secondary | ICD-10-CM | POA: Diagnosis not present

## 2021-12-25 DIAGNOSIS — R030 Elevated blood-pressure reading, without diagnosis of hypertension: Secondary | ICD-10-CM | POA: Diagnosis not present

## 2021-12-25 DIAGNOSIS — M2550 Pain in unspecified joint: Secondary | ICD-10-CM | POA: Diagnosis not present

## 2021-12-25 DIAGNOSIS — G894 Chronic pain syndrome: Secondary | ICD-10-CM | POA: Diagnosis not present

## 2022-01-09 DIAGNOSIS — E119 Type 2 diabetes mellitus without complications: Secondary | ICD-10-CM | POA: Diagnosis not present

## 2022-01-09 DIAGNOSIS — I5030 Unspecified diastolic (congestive) heart failure: Secondary | ICD-10-CM | POA: Diagnosis not present

## 2022-01-10 ENCOUNTER — Encounter: Payer: Self-pay | Admitting: Interventional Cardiology

## 2022-01-10 NOTE — Telephone Encounter (Signed)
Error

## 2022-01-14 ENCOUNTER — Encounter: Payer: Self-pay | Admitting: Cardiovascular Disease

## 2022-01-14 DIAGNOSIS — I5030 Unspecified diastolic (congestive) heart failure: Secondary | ICD-10-CM | POA: Diagnosis not present

## 2022-01-14 NOTE — Progress Notes (Signed)
No show  This encounter was created in error - please disregard.

## 2022-01-15 ENCOUNTER — Ambulatory Visit: Payer: Medicare PPO | Attending: Cardiovascular Disease | Admitting: Cardiovascular Disease

## 2022-01-16 DIAGNOSIS — D649 Anemia, unspecified: Secondary | ICD-10-CM | POA: Diagnosis not present

## 2022-01-16 DIAGNOSIS — M0589 Other rheumatoid arthritis with rheumatoid factor of multiple sites: Secondary | ICD-10-CM | POA: Diagnosis not present

## 2022-01-21 DIAGNOSIS — J3081 Allergic rhinitis due to animal (cat) (dog) hair and dander: Secondary | ICD-10-CM | POA: Diagnosis not present

## 2022-01-21 DIAGNOSIS — J3089 Other allergic rhinitis: Secondary | ICD-10-CM | POA: Diagnosis not present

## 2022-01-21 DIAGNOSIS — J301 Allergic rhinitis due to pollen: Secondary | ICD-10-CM | POA: Diagnosis not present

## 2022-01-31 DIAGNOSIS — Z79899 Other long term (current) drug therapy: Secondary | ICD-10-CM | POA: Diagnosis not present

## 2022-01-31 DIAGNOSIS — Z0000 Encounter for general adult medical examination without abnormal findings: Secondary | ICD-10-CM | POA: Diagnosis not present

## 2022-01-31 DIAGNOSIS — R030 Elevated blood-pressure reading, without diagnosis of hypertension: Secondary | ICD-10-CM | POA: Diagnosis not present

## 2022-01-31 DIAGNOSIS — Z9181 History of falling: Secondary | ICD-10-CM | POA: Diagnosis not present

## 2022-01-31 DIAGNOSIS — G894 Chronic pain syndrome: Secondary | ICD-10-CM | POA: Diagnosis not present

## 2022-01-31 DIAGNOSIS — E669 Obesity, unspecified: Secondary | ICD-10-CM | POA: Diagnosis not present

## 2022-01-31 DIAGNOSIS — M2550 Pain in unspecified joint: Secondary | ICD-10-CM | POA: Diagnosis not present

## 2022-01-31 DIAGNOSIS — E119 Type 2 diabetes mellitus without complications: Secondary | ICD-10-CM | POA: Diagnosis not present

## 2022-02-11 DIAGNOSIS — M1712 Unilateral primary osteoarthritis, left knee: Secondary | ICD-10-CM | POA: Diagnosis not present

## 2022-02-13 DIAGNOSIS — I5030 Unspecified diastolic (congestive) heart failure: Secondary | ICD-10-CM | POA: Diagnosis not present

## 2022-02-13 DIAGNOSIS — Z6838 Body mass index (BMI) 38.0-38.9, adult: Secondary | ICD-10-CM | POA: Diagnosis not present

## 2022-02-14 DIAGNOSIS — D649 Anemia, unspecified: Secondary | ICD-10-CM | POA: Diagnosis not present

## 2022-02-14 DIAGNOSIS — M0589 Other rheumatoid arthritis with rheumatoid factor of multiple sites: Secondary | ICD-10-CM | POA: Diagnosis not present

## 2022-02-15 DIAGNOSIS — H40013 Open angle with borderline findings, low risk, bilateral: Secondary | ICD-10-CM | POA: Diagnosis not present

## 2022-02-15 DIAGNOSIS — H25811 Combined forms of age-related cataract, right eye: Secondary | ICD-10-CM | POA: Diagnosis not present

## 2022-02-18 ENCOUNTER — Telehealth: Payer: Self-pay | Admitting: Interventional Cardiology

## 2022-02-18 NOTE — Telephone Encounter (Signed)
Pt c/o swelling: STAT is pt has developed SOB within 24 hours  How much weight have you gained and in what time span?  Patient states her weight fluctuates. Last month she gained 10 lbs. During recent PCP visit she had lost 5 lbs   If swelling, where is the swelling located?  Feet/ankles +legs weeping fluid  Are you currently taking a fluid pill?  Patient states yesterday she started on a fluid pill, but she does not know the name of it  Are you currently SOB?  No   Do you have a log of your daily weights (if so, list)?  No   Have you gained 3 pounds in a day or 5 pounds in a week?   Have you traveled recently?

## 2022-02-18 NOTE — Telephone Encounter (Signed)
Returned call to patient.  She states she has swelling in her feet and ankles, denies any SOB. She states her PCP Dr. Wynelle Cleveland would like for her to follow-up with cardiologist to determine if swelling is heart related.   Patient requests to establish with new cardiologist since Dr. Tamala Julian has retired.  Offered next available appt with Dr. Marlou Porch on 02/27/2022 at 9:30AM. Patient accepted appointment and expressed appreciation for assistance.

## 2022-02-20 DIAGNOSIS — I5032 Chronic diastolic (congestive) heart failure: Secondary | ICD-10-CM | POA: Diagnosis not present

## 2022-02-20 DIAGNOSIS — R609 Edema, unspecified: Secondary | ICD-10-CM | POA: Diagnosis not present

## 2022-02-20 DIAGNOSIS — Z23 Encounter for immunization: Secondary | ICD-10-CM | POA: Diagnosis not present

## 2022-02-20 DIAGNOSIS — Z6837 Body mass index (BMI) 37.0-37.9, adult: Secondary | ICD-10-CM | POA: Diagnosis not present

## 2022-02-27 ENCOUNTER — Ambulatory Visit: Payer: Medicare PPO | Attending: Cardiology | Admitting: Cardiology

## 2022-02-27 ENCOUNTER — Encounter: Payer: Self-pay | Admitting: Cardiology

## 2022-02-27 VITALS — BP 132/68 | HR 67 | Ht 59.0 in | Wt 191.0 lb

## 2022-02-27 DIAGNOSIS — Z79899 Other long term (current) drug therapy: Secondary | ICD-10-CM

## 2022-02-27 MED ORDER — FUROSEMIDE 20 MG PO TABS: 20.0000 mg | ORAL_TABLET | Freq: Every day | ORAL | 3 refills | Status: DC

## 2022-02-27 NOTE — Patient Instructions (Addendum)
Medication Instructions:  Your physician has recommended you make the following change in your medication:   1) STOP pioglitazone (Actos) 2) Continue taking furosemide (Lasix) 20mg  daily  *If you need a refill on your cardiac medications before your next appointment, please call your pharmacy*  Lab Work: TODAY: BMET If you have labs (blood work) drawn today and your tests are completely normal, you will receive your results only by: [place] (if you have MyChart) OR A paper copy in the mail If you have any lab test that is abnormal or we need to change your treatment, we will call you to review the results.  Testing/Procedures: NONE  Follow-Up: At [facility], you and your health needs are our priority.  As part of our continuing mission to provide you with exceptional heart care, we have created designated Provider Care Teams.  These Care Teams include your primary Cardiologist (physician) and Advanced Practice Providers (APPs -  Physician Assistants and Nurse Practitioners) who all work together to provide you with the care you need, when you need it.  Your next appointment:   3 month(s)  Provider:   Nicholes Rough, PA-C  Other Instructions Please limit your water intake to 2 liters daily.

## 2022-02-27 NOTE — Progress Notes (Signed)
Cardiology Office Note:    Date:  02/27/2022   ID:  Amanda, [name] 03/25/1945, MRN [id number]  PCP:  [facility] Cardiologist:  Sinclair Grooms, MD     Referring MD: [facility]    History of Present Illness:    [name] is a 77 y.o. female here for the evaluation of edema.  Former patient of Dr. Daneen Schick.  She recently called our office stating that she had swelling in her feet and ankles but denied any shortness of breath.  Her legs were weeping fluid.  She started on a fluid pill but did not Dr. Edwina Barth, her primary care physician wished her to follow-up with cardiologist.  Wanted to know if swelling was cardiac related.  She has been recently seen in October 2023 by Nicholes Rough, PA  2017 hip revision. Pain has never stopped. Not so much swelling. Been to every doctor. Dr. Delfino Lovett. Now has swelling and pain and weeping. Hard to sleep at night.   Has RA. Dr. Lenna Gilford. 12 day predinose taper. Now finished and pain is back. Prednisone helped with the pain.   SOB is better now.   A1c 6.0   Past Medical History:  Diagnosis Date   Chronic fatigue    Depression    Diabetes mellitus without complication ([name])    Early cataracts, bilateral    Fibromyalgia    Glaucoma    Headache    Vestibular migraines   History of bronchitis    History of pneumonia    Hypercholesterolemia    RA (rheumatoid arthritis) ([place])    RA (rheumatoid arthritis) ([place])    TIA (transient ischemic attack)    Vertigo    Wears glasses     Past Surgical History:  Procedure Laterality Date   APPENDECTOMY     BACK SURGERY  2012, 2017   BILATERAL CARPAL TUNNEL RELEASE     CESAREAN SECTION     x3   DILATION AND CURETTAGE OF UTERUS     GLAUCOMA SURGERY     KNEE ARTHROSCOPY Left    MOUTH SURGERY     TONSILLECTOMY     TOTAL HIP ARTHROPLASTY Right 01/30/2017   Procedure: RIGHT TOTAL HIP  ARTHROPLASTY ANTERIOR APPROACH;  Surgeon: Mcarthur Rossetti, MD;  Location: [facility];  Service: Orthopedics;  Laterality: Right;   TOTAL HIP REVISION Right 07/28/2019   Procedure: TOTAL HIP REVISION, posterior approach femoal comp;  Surgeon: Rod Can, MD;  Location: [facility];  Service: Orthopedics;  Laterality: Right;    Current Medications: Current Meds  Medication Sig   ACCU-CHEK GUIDE test strip as needed.   Accu-Chek Softclix Lancets lancets    acetaminophen (TYLENOL) 325 MG tablet Take 2 tablets (650 mg total) by mouth every 6 (six) hours as needed for mild pain (pain score 1-3 or temp > 100.5).   cetirizine (ZYRTEC) 10 MG tablet Take 10 mg by mouth daily.    Cholecalciferol (VITAMIN D) 50 MCG (2000 UT) tablet Take 2,000 Units by mouth daily.    clopidogrel (PLAVIX) 75 MG tablet Take 1 tablet (75 mg total) by mouth daily.   docusate sodium (COLACE) 100 MG capsule Take 1 capsule (100 mg total) by mouth 2 (two) times daily. (Patient taking differently: Take 100 mg by mouth as needed for mild constipation.)   DULoxetine (CYMBALTA) 30 MG capsule Take 60 mg by mouth daily.    EPIPEN 2-PAK 0.3  MG/0.3ML SOAJ injection Inject 0.3 mg into the muscle daily as needed. For anaphylactic allergic reaction   ezetimibe (ZETIA) 10 MG tablet Take 10 mg by mouth daily.   fenofibrate 54 MG tablet Take 54 mg by mouth daily.   folic acid (FOLVITE) 1 MG tablet Take 1 mg by mouth daily.   gabapentin (NEURONTIN) 300 MG capsule Take 300 mg by mouth 2 (two) times daily.   HYDROcodone-acetaminophen (NORCO) 7.5-325 MG tablet Take 1-4 tablets by mouth every 6 (six) hours as needed for moderate pain.   hydrOXYzine (ATARAX/VISTARIL) 25 MG tablet Take 25 mg by mouth daily.   latanoprost (XALATAN) 0.005 % ophthalmic solution Place 1 drop into both eyes at bedtime.    methotrexate (RHEUMATREX) 2.5 MG tablet Take 10 tablets by mouth once a week.   NON FORMULARY Inject 1 Syringe into the skin 2 (two) times a week.  Allergy Shots   rosuvastatin (CRESTOR) 5 MG tablet Take 1 tablet (5 mg total) by mouth daily.   spironolactone (ALDACTONE) 25 MG tablet Take 1 tablet (25 mg total) by mouth daily.   Tocilizumab (ACTEMRA) 200 MG/10ML SOLN    topiramate (TOPAMAX) 50 MG tablet Take 1 tablet (50 mg total) by mouth at bedtime.   [DISCONTINUED] furosemide (LASIX) 20 MG tablet daily.   [DISCONTINUED] pioglitazone (ACTOS) 30 MG tablet Take 30 mg by mouth daily.     Allergies:   Leflunomide, Simvastatin, and Sulfa antibiotics   Social History   Socioeconomic History   Marital status: Widowed    Spouse name: Not on file   Number of children: 4   Years of education: 16   Highest education level: Not on file  Occupational History   Occupation: Retired    Comment: Runner, broadcasting/film/video  Tobacco Use   Smoking status: Never   Smokeless tobacco: Never  Vaping Use   Vaping Use: Never used  Substance and Sexual Activity   Alcohol use: No   Drug use: No   Sexual activity: Not on file  Other Topics Concern   Not on file  Social History Narrative   Lives at home alone   Right-handed   Caffeine: tea daily   Has 10 grandchildren   Social Determinants of Health   Financial Resource Strain: Not on file  Food Insecurity: Not on file  Transportation Needs: Not on file  Physical Activity: Not on file  Stress: Not on file  Social Connections: Not on file     Family History: The patient's family history includes Diabetes in her father and mother; Heart disease in her father, mother, and sister; High blood pressure in her father; Stroke in her father and mother. There is no history of Migraines.  ROS:   Please see the history of present illness.    No fevers chills nausea vomiting all other systems reviewed and are negative.  EKGs/Labs/Other Studies Reviewed:    The following studies were reviewed today:  Echocardiogram 05/14/2021:   1. Left ventricular ejection fraction, by estimation, is 60 to 65%. The  left  ventricle has normal function. The left ventricle has no regional  wall motion abnormalities. There is mild left ventricular hypertrophy of  the basal-septal segment. Left  ventricular diastolic parameters are consistent with Grade I diastolic  dysfunction (impaired relaxation).   2. Right ventricular systolic function is normal. The right ventricular  size is mildly enlarged. There is normal pulmonary artery systolic  pressure.   3. Left atrial size was severely dilated.   4. No evidence  of mitral valve regurgitation.   5. The aortic valve was not well visualized. Aortic valve regurgitation  is not visualized.   6. The inferior vena cava is normal in size with greater than 50%  respiratory variability, suggesting right atrial pressure of 3 mmHg   10/2021 vascular ultrasound venous-no DVTs  Coronary CT scan 08/16/2021: 1. Coronary calcium score of 1133. This was 95th percentile for age-, race-, and sex-matched controls.   2. Normal coronary origin with right dominance.   3. Moderate (50-69%) calcified plaque in the proximal to mid LAD. FFR showed nonobstructive disease.   4.  Aortic atherosclerosis.  EKG: 04/24/2021-sinus bradycardia left axis deviation poor R wave progression  Recent Labs: 10/10/2021: BUN 24; Creatinine, Ser 1.09; Hemoglobin 14.1; Platelets 339; Potassium 4.5; Sodium 133; TSH 1.140 11/05/2021: ALT 19  Recent Lipid Panel    Component Value Date/Time   CHOL 149 11/05/2021 1147   TRIG 105 11/05/2021 1147   HDL 62 11/05/2021 1147   CHOLHDL 2.4 11/05/2021 1147   CHOLHDL 6.6 02/25/2007 0520   VLDL 29 02/25/2007 0520   LDLCALC 68 11/05/2021 1147     Risk Assessment/Calculations:               Physical Exam:    VS:  BP 132/68   Pulse 67   Ht 4\' 11"  (1.499 m)   Wt 191 lb (86.6 kg)   SpO2 99%   BMI 38.58 kg/m     Wt Readings from Last 3 Encounters:  02/27/22 191 lb (86.6 kg)  11/05/21 182 lb 12.8 oz (82.9 kg)  10/10/21 185 lb 12.8 oz (84.3 kg)      GEN: Short in stature ambulates with a cane, slow gait well nourished, well developed in no acute distress HEENT: Normal NECK: No JVD; No carotid bruits LYMPHATICS: No lymphadenopathy CARDIAC: RRR, no murmurs, rubs, gallops RESPIRATORY:  Clear to auscultation without rales, wheezing or rhonchi  ABDOMEN: Soft, non-tender, non-distended MUSCULOSKELETAL:  No edema; No deformity  SKIN: Warm and dry NEUROLOGIC:  Alert and oriented x 3 PSYCHIATRIC:  Normal affect   ASSESSMENT:    1. Medication management    PLAN:    In order of problems listed above:  Chronic diastolic heart failure -Had grade 1 diastolic dysfunction on echocardiogram, mildly increased stiffness.  Given her symptoms, it was recommended that [place] or Jardiance be started by Dr. Tamala Julian.  She tried this but had vomiting and diarrhea and significant nausea and this medication was stopped. -She is now on Lasix as well 20 mg daily.  Instead of a 30-day prescription, we will make this a long-term medication for her.  I also discussed with her lowering her sodium content, she does state that she has issues with this.  I also want her to watch her water intake and limit it to 2 L at the most.  She does drink quite a bit of water daily.  We will check basic metabolic profile today.  Last creatinine was 1.09 in September on her list.  Lower extremity edema - In August 2023 she was started on spironolactone for lower extremity edema and shortness of breath.  She is on furosemide now as well.  Fairly longstanding issue.  Has had lower extremity Dopplers performed, no DVT  Coronary artery disease with coronary calcium score greater than 1100 - No active anginal symptoms - Currently on Plavix 75, Zetia 10 mg, fibrate 54 mg, Crestor 5 mg daily, spironolactone 25 mg a day.  Hyperlipidemia - LDL previously  68.  Continue current therapy.  Leg pain tenderness - Gabapentin-low-dose.  Has had issues with ankle pain since surgery many  years ago.  Diabetes mellitus - She has been on Actos for over 20 years.  Her hemoglobin A1c last check was 6.1.  Actos can lead to worsening lower extremity edema or heart failure type symptoms.  We will go ahead and discontinue this.  There are many alternatives that she can try if necessary.  I have asked her to reach out to Dr. Vincente Poli to see if an alternative agent will be necessary.  Jardiance she did not tolerate.  42 minutes spent with discussion patient review medical records exam labs studies.     3 months [place]    Medication Adjustments/Labs and Tests Ordered: Current medicines are reviewed at length with the patient today.  Concerns regarding medicines are outlined above.  Orders Placed This Encounter  Procedures   Basic metabolic panel   Meds ordered this encounter  Medications   furosemide (LASIX) 20 MG tablet    Sig: Take 1 tablet (20 mg total) by mouth daily.    Dispense:  90 tablet    Refill:  3    Patient Instructions  Medication Instructions:  Your physician has recommended you make the following change in your medication:   1) STOP pioglitazone (Actos) 2) Continue taking furosemide (Lasix) 20mg  daily  *If you need a refill on your cardiac medications before your next appointment, please call your pharmacy*  Lab Work: TODAY: BMET If you have labs (blood work) drawn today and your tests are completely normal, you will receive your results only by: MyChart Message (if you have MyChart) OR A paper copy in the mail If you have any lab test that is abnormal or we need to change your treatment, we will call you to review the results.  Testing/Procedures: NONE  Follow-Up: At [facility], you and your health needs are our priority.  As part of our continuing mission to provide you with exceptional heart care, we have created designated Provider Care Teams.  These Care Teams include your primary Cardiologist (physician) and Advanced Practice Providers  (APPs -  Physician Assistants and Nurse Practitioners) who all work together to provide you with the care you need, when you need it.  Your next appointment:   3 month(s)  Provider:   [facility], PA-C  Other Instructions Please limit your water intake to 2 liters daily.   Signed, Jari Favre, MD  02/27/2022 12:14 PM    [facility]

## 2022-02-28 DIAGNOSIS — E1169 Type 2 diabetes mellitus with other specified complication: Secondary | ICD-10-CM | POA: Diagnosis not present

## 2022-02-28 DIAGNOSIS — Z03818 Encounter for observation for suspected exposure to other biological agents ruled out: Secondary | ICD-10-CM | POA: Diagnosis not present

## 2022-02-28 DIAGNOSIS — M069 Rheumatoid arthritis, unspecified: Secondary | ICD-10-CM | POA: Diagnosis not present

## 2022-02-28 DIAGNOSIS — I10 Essential (primary) hypertension: Secondary | ICD-10-CM | POA: Diagnosis not present

## 2022-02-28 DIAGNOSIS — J029 Acute pharyngitis, unspecified: Secondary | ICD-10-CM | POA: Diagnosis not present

## 2022-02-28 LAB — BASIC METABOLIC PANEL
BUN/Creatinine Ratio: 23 (ref 12–28)
BUN: 24 mg/dL (ref 8–27)
CO2: 25 mmol/L (ref 20–29)
Calcium: 10.1 mg/dL (ref 8.7–10.3)
Chloride: 99 mmol/L (ref 96–106)
Creatinine, Ser: 1.04 mg/dL — ABNORMAL HIGH (ref 0.57–1.00)
Glucose: 115 mg/dL — ABNORMAL HIGH (ref 70–99)
Potassium: 4.3 mmol/L (ref 3.5–5.2)
Sodium: 137 mmol/L (ref 134–144)
eGFR: 56 mL/min/{1.73_m2} — ABNORMAL LOW (ref >59)

## 2022-03-06 DIAGNOSIS — R0981 Nasal congestion: Secondary | ICD-10-CM | POA: Diagnosis not present

## 2022-03-11 DIAGNOSIS — J3081 Allergic rhinitis due to animal (cat) (dog) hair and dander: Secondary | ICD-10-CM | POA: Diagnosis not present

## 2022-03-11 DIAGNOSIS — J3089 Other allergic rhinitis: Secondary | ICD-10-CM | POA: Diagnosis not present

## 2022-03-11 DIAGNOSIS — J301 Allergic rhinitis due to pollen: Secondary | ICD-10-CM | POA: Diagnosis not present

## 2022-03-13 DIAGNOSIS — Z79899 Other long term (current) drug therapy: Secondary | ICD-10-CM | POA: Diagnosis not present

## 2022-03-13 DIAGNOSIS — M2550 Pain in unspecified joint: Secondary | ICD-10-CM | POA: Diagnosis not present

## 2022-03-13 DIAGNOSIS — G894 Chronic pain syndrome: Secondary | ICD-10-CM | POA: Diagnosis not present

## 2022-03-13 DIAGNOSIS — R030 Elevated blood-pressure reading, without diagnosis of hypertension: Secondary | ICD-10-CM | POA: Diagnosis not present

## 2022-03-13 DIAGNOSIS — E119 Type 2 diabetes mellitus without complications: Secondary | ICD-10-CM | POA: Diagnosis not present

## 2022-03-13 DIAGNOSIS — Z9181 History of falling: Secondary | ICD-10-CM | POA: Diagnosis not present

## 2022-03-13 DIAGNOSIS — E669 Obesity, unspecified: Secondary | ICD-10-CM | POA: Diagnosis not present

## 2022-03-14 DIAGNOSIS — J3081 Allergic rhinitis due to animal (cat) (dog) hair and dander: Secondary | ICD-10-CM | POA: Diagnosis not present

## 2022-03-14 DIAGNOSIS — J301 Allergic rhinitis due to pollen: Secondary | ICD-10-CM | POA: Diagnosis not present

## 2022-03-14 DIAGNOSIS — J3089 Other allergic rhinitis: Secondary | ICD-10-CM | POA: Diagnosis not present

## 2022-03-20 DIAGNOSIS — Z79899 Other long term (current) drug therapy: Secondary | ICD-10-CM | POA: Diagnosis not present

## 2022-03-20 DIAGNOSIS — M0589 Other rheumatoid arthritis with rheumatoid factor of multiple sites: Secondary | ICD-10-CM | POA: Diagnosis not present

## 2022-03-22 DIAGNOSIS — J3089 Other allergic rhinitis: Secondary | ICD-10-CM | POA: Diagnosis not present

## 2022-03-22 DIAGNOSIS — J3081 Allergic rhinitis due to animal (cat) (dog) hair and dander: Secondary | ICD-10-CM | POA: Diagnosis not present

## 2022-03-22 DIAGNOSIS — J301 Allergic rhinitis due to pollen: Secondary | ICD-10-CM | POA: Diagnosis not present

## 2022-03-24 ENCOUNTER — Other Ambulatory Visit: Payer: Self-pay | Admitting: Adult Health

## 2022-03-25 DIAGNOSIS — M65879 Other synovitis and tenosynovitis, unspecified ankle and foot: Secondary | ICD-10-CM | POA: Diagnosis not present

## 2022-03-25 DIAGNOSIS — M79672 Pain in left foot: Secondary | ICD-10-CM | POA: Diagnosis not present

## 2022-03-25 DIAGNOSIS — M1712 Unilateral primary osteoarthritis, left knee: Secondary | ICD-10-CM | POA: Diagnosis not present

## 2022-04-05 DIAGNOSIS — Z79899 Other long term (current) drug therapy: Secondary | ICD-10-CM | POA: Diagnosis not present

## 2022-04-05 DIAGNOSIS — M0589 Other rheumatoid arthritis with rheumatoid factor of multiple sites: Secondary | ICD-10-CM | POA: Diagnosis not present

## 2022-04-05 DIAGNOSIS — J3081 Allergic rhinitis due to animal (cat) (dog) hair and dander: Secondary | ICD-10-CM | POA: Diagnosis not present

## 2022-04-05 DIAGNOSIS — J301 Allergic rhinitis due to pollen: Secondary | ICD-10-CM | POA: Diagnosis not present

## 2022-04-05 DIAGNOSIS — J3089 Other allergic rhinitis: Secondary | ICD-10-CM | POA: Diagnosis not present

## 2022-04-09 DIAGNOSIS — M7672 Peroneal tendinitis, left leg: Secondary | ICD-10-CM | POA: Diagnosis not present

## 2022-04-11 DIAGNOSIS — M7672 Peroneal tendinitis, left leg: Secondary | ICD-10-CM | POA: Diagnosis not present

## 2022-04-12 ENCOUNTER — Other Ambulatory Visit: Payer: Self-pay | Admitting: Family Medicine

## 2022-04-12 DIAGNOSIS — Z1211 Encounter for screening for malignant neoplasm of colon: Secondary | ICD-10-CM | POA: Diagnosis not present

## 2022-04-12 DIAGNOSIS — Z0000 Encounter for general adult medical examination without abnormal findings: Secondary | ICD-10-CM | POA: Diagnosis not present

## 2022-04-12 DIAGNOSIS — G894 Chronic pain syndrome: Secondary | ICD-10-CM | POA: Diagnosis not present

## 2022-04-12 DIAGNOSIS — Z79899 Other long term (current) drug therapy: Secondary | ICD-10-CM | POA: Diagnosis not present

## 2022-04-12 DIAGNOSIS — M2550 Pain in unspecified joint: Secondary | ICD-10-CM | POA: Diagnosis not present

## 2022-04-12 DIAGNOSIS — E2839 Other primary ovarian failure: Secondary | ICD-10-CM

## 2022-04-12 DIAGNOSIS — E669 Obesity, unspecified: Secondary | ICD-10-CM | POA: Diagnosis not present

## 2022-04-12 DIAGNOSIS — Z1389 Encounter for screening for other disorder: Secondary | ICD-10-CM | POA: Diagnosis not present

## 2022-04-12 DIAGNOSIS — Z9181 History of falling: Secondary | ICD-10-CM | POA: Diagnosis not present

## 2022-04-12 DIAGNOSIS — Z1231 Encounter for screening mammogram for malignant neoplasm of breast: Secondary | ICD-10-CM

## 2022-04-12 DIAGNOSIS — R030 Elevated blood-pressure reading, without diagnosis of hypertension: Secondary | ICD-10-CM | POA: Diagnosis not present

## 2022-04-12 DIAGNOSIS — E119 Type 2 diabetes mellitus without complications: Secondary | ICD-10-CM | POA: Diagnosis not present

## 2022-04-13 DIAGNOSIS — M542 Cervicalgia: Secondary | ICD-10-CM | POA: Diagnosis not present

## 2022-04-17 DIAGNOSIS — M0589 Other rheumatoid arthritis with rheumatoid factor of multiple sites: Secondary | ICD-10-CM | POA: Diagnosis not present

## 2022-04-18 DIAGNOSIS — M7672 Peroneal tendinitis, left leg: Secondary | ICD-10-CM | POA: Diagnosis not present

## 2022-04-19 DIAGNOSIS — J301 Allergic rhinitis due to pollen: Secondary | ICD-10-CM | POA: Diagnosis not present

## 2022-04-19 DIAGNOSIS — J3081 Allergic rhinitis due to animal (cat) (dog) hair and dander: Secondary | ICD-10-CM | POA: Diagnosis not present

## 2022-04-19 DIAGNOSIS — J3089 Other allergic rhinitis: Secondary | ICD-10-CM | POA: Diagnosis not present

## 2022-04-24 DIAGNOSIS — M7672 Peroneal tendinitis, left leg: Secondary | ICD-10-CM | POA: Diagnosis not present

## 2022-04-25 DIAGNOSIS — Z79899 Other long term (current) drug therapy: Secondary | ICD-10-CM | POA: Diagnosis not present

## 2022-04-25 DIAGNOSIS — Z6835 Body mass index (BMI) 35.0-35.9, adult: Secondary | ICD-10-CM | POA: Diagnosis not present

## 2022-04-25 DIAGNOSIS — M1991 Primary osteoarthritis, unspecified site: Secondary | ICD-10-CM | POA: Diagnosis not present

## 2022-04-25 DIAGNOSIS — M0589 Other rheumatoid arthritis with rheumatoid factor of multiple sites: Secondary | ICD-10-CM | POA: Diagnosis not present

## 2022-04-25 DIAGNOSIS — E669 Obesity, unspecified: Secondary | ICD-10-CM | POA: Diagnosis not present

## 2022-04-25 DIAGNOSIS — M797 Fibromyalgia: Secondary | ICD-10-CM | POA: Diagnosis not present

## 2022-05-02 DIAGNOSIS — J3089 Other allergic rhinitis: Secondary | ICD-10-CM | POA: Diagnosis not present

## 2022-05-02 DIAGNOSIS — J301 Allergic rhinitis due to pollen: Secondary | ICD-10-CM | POA: Diagnosis not present

## 2022-05-02 DIAGNOSIS — J3081 Allergic rhinitis due to animal (cat) (dog) hair and dander: Secondary | ICD-10-CM | POA: Diagnosis not present

## 2022-05-13 DIAGNOSIS — Z79899 Other long term (current) drug therapy: Secondary | ICD-10-CM | POA: Diagnosis not present

## 2022-05-13 DIAGNOSIS — M2550 Pain in unspecified joint: Secondary | ICD-10-CM | POA: Diagnosis not present

## 2022-05-13 DIAGNOSIS — R030 Elevated blood-pressure reading, without diagnosis of hypertension: Secondary | ICD-10-CM | POA: Diagnosis not present

## 2022-05-13 DIAGNOSIS — G894 Chronic pain syndrome: Secondary | ICD-10-CM | POA: Diagnosis not present

## 2022-05-13 DIAGNOSIS — Z9181 History of falling: Secondary | ICD-10-CM | POA: Diagnosis not present

## 2022-05-13 DIAGNOSIS — E669 Obesity, unspecified: Secondary | ICD-10-CM | POA: Diagnosis not present

## 2022-05-13 DIAGNOSIS — E119 Type 2 diabetes mellitus without complications: Secondary | ICD-10-CM | POA: Diagnosis not present

## 2022-05-16 DIAGNOSIS — M0589 Other rheumatoid arthritis with rheumatoid factor of multiple sites: Secondary | ICD-10-CM | POA: Diagnosis not present

## 2022-05-21 DIAGNOSIS — Z79899 Other long term (current) drug therapy: Secondary | ICD-10-CM | POA: Diagnosis not present

## 2022-05-21 DIAGNOSIS — M532X1 Spinal instabilities, occipito-atlanto-axial region: Secondary | ICD-10-CM | POA: Diagnosis not present

## 2022-05-21 DIAGNOSIS — Z5181 Encounter for therapeutic drug level monitoring: Secondary | ICD-10-CM | POA: Diagnosis not present

## 2022-05-24 DIAGNOSIS — E7800 Pure hypercholesterolemia, unspecified: Secondary | ICD-10-CM | POA: Diagnosis not present

## 2022-05-24 DIAGNOSIS — D84821 Immunodeficiency due to drugs: Secondary | ICD-10-CM | POA: Diagnosis not present

## 2022-05-24 DIAGNOSIS — E1136 Type 2 diabetes mellitus with diabetic cataract: Secondary | ICD-10-CM | POA: Diagnosis not present

## 2022-05-24 DIAGNOSIS — Z79631 Long term (current) use of antimetabolite agent: Secondary | ICD-10-CM | POA: Diagnosis not present

## 2022-05-24 DIAGNOSIS — I700 Atherosclerosis of aorta: Secondary | ICD-10-CM | POA: Diagnosis not present

## 2022-05-24 DIAGNOSIS — I10 Essential (primary) hypertension: Secondary | ICD-10-CM | POA: Diagnosis not present

## 2022-05-27 ENCOUNTER — Encounter: Payer: Self-pay | Admitting: Physician Assistant

## 2022-05-27 ENCOUNTER — Ambulatory Visit: Payer: Medicare PPO | Attending: Physician Assistant | Admitting: Physician Assistant

## 2022-05-27 VITALS — BP 140/82 | HR 59 | Ht 59.0 in | Wt 186.0 lb

## 2022-05-27 DIAGNOSIS — E785 Hyperlipidemia, unspecified: Secondary | ICD-10-CM

## 2022-05-27 DIAGNOSIS — I2510 Atherosclerotic heart disease of native coronary artery without angina pectoris: Secondary | ICD-10-CM

## 2022-05-27 DIAGNOSIS — M79605 Pain in left leg: Secondary | ICD-10-CM | POA: Diagnosis not present

## 2022-05-27 DIAGNOSIS — I5032 Chronic diastolic (congestive) heart failure: Secondary | ICD-10-CM

## 2022-05-27 DIAGNOSIS — R5383 Other fatigue: Secondary | ICD-10-CM | POA: Diagnosis not present

## 2022-05-27 NOTE — Progress Notes (Signed)
Office Visit    Patient Name: [name] Date of Encounter: 05/27/2022  PCP:  [facility] @ [facility]  Cardiologist:  Meriam Sprague, MD  Advanced Practice Provider:  No care team member to display Electrophysiologist:  None    HPI    [name] is a 77 y.o. female with a past medical history significant for chronic diastolic heart failure, coronary calcium score 1100, aortic atherosclerosis, diabetes mellitus type 2, TIA, estrogen insufficiency, rheumatoid arthritis, fibromyalgia, spinal stenosis, statin intolerance, and hyperlipidemia presents today for 1 month follow-up appointment.  Of note, patient had significant nausea and vomiting and diarrhea on Jardiance.  Medication had been stopped.  Seen by Dr. Katrinka Blazing 10/03/2021 with lower extremity edema and DOE and started on spironolactone.  Rosuvastatin increased to 5 mg daily.  Last seen by Herma Carson, PA 10/10/2021.  Patient lost 7 pounds since seen in August.  Edema improved on spironolactone.  Patient wanted to be seen because she felt fatigued, miserable, weak with no energy.  She believes she was overmedicated.  Her legs were tender to the touch.  Labs were checked and she was set up with her primary care.  She was seen by me 10/23, she still feels like she is overmedicated.  She asked me today if is necessary for her to take all of her medications.  From a heart standpoint, all her medications are indicated at this time.  She shares that she has had some excessive sweating over the past 4 to 5 months and attributes it to medications.  In reviewing her medications, I did not see any that would have the side effect.  She also states that she has dry mouth but this is likely due to her hydrocodone.  She takes this for her right hip pain.  She is ready had 2 surgeries and is not interested in the third.  She sees Dr. Linna Caprice.  She saw Dr. Anne Fu January  2024 and fluid status was being watched closely.  Today, she tells me that her swelling is much better.  She had issues with RA which is being addressed by her primary.  She has not had any shortness of breath or chest pain.  She thinks that she got food poisoning from [employer] and had a lot of diarrhea.  This has resolved.  Reports no shortness of breath nor dyspnea on exertion. Reports no chest pain, pressure, or tightness. No edema, orthopnea, PND. Reports no palpitations.    Past Medical History    Past Medical History:  Diagnosis Date   Chronic fatigue    Depression    Diabetes mellitus without complication    Early cataracts, bilateral    Fibromyalgia    Glaucoma    Headache    Vestibular migraines   History of bronchitis    History of pneumonia    Hypercholesterolemia    RA (rheumatoid arthritis)    RA (rheumatoid arthritis)    TIA (transient ischemic attack)    Vertigo    Wears glasses    Past Surgical History:  Procedure Laterality Date   APPENDECTOMY     BACK SURGERY  2012, 2017   BILATERAL CARPAL TUNNEL RELEASE     CESAREAN SECTION     x3   DILATION AND CURETTAGE OF UTERUS     GLAUCOMA SURGERY     KNEE ARTHROSCOPY Left    MOUTH SURGERY     TONSILLECTOMY  TOTAL HIP ARTHROPLASTY Right 01/30/2017   Procedure: RIGHT TOTAL HIP ARTHROPLASTY ANTERIOR APPROACH;  Surgeon: Kathryne Hitch, MD;  Location: [facility];  Service: Orthopedics;  Laterality: Right;   TOTAL HIP REVISION Right 07/28/2019   Procedure: TOTAL HIP REVISION, posterior approach femoal comp;  Surgeon: Samson Frederic, MD;  Location: [facility] OR;  Service: Orthopedics;  Laterality: Right;    Allergies  Allergies  Allergen Reactions   Leflunomide Diarrhea   Simvastatin     Other Reaction(s): elevated CPK, muscle pain   Sulfa Antibiotics Other (See Comments)    Don't remember      EKGs/Labs/Other Studies Reviewed:   The following studies were reviewed today:  Coronary CTA July  2023: IMPRESSION: 1. Coronary calcium score of 1133. This was 95th percentile for age-, race-, and sex-matched controls.   2. Normal coronary origin with right dominance.   3. Moderate (50-69%) calcified plaque in the proximal to mid LAD. Will send for FFRct.   4.  Aortic atherosclerosis   Coronary CT FFR: July 2023 IMPRESSION: 1.  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   2. LAD is heavily calcified. Recommend aggressive risk factor modification, including LDL goal <70.     2D Doppler echocardiogram/11/2021: IMPRESSIONS   1. Left ventricular ejection fraction, by estimation, is 60 to 65%. The  left ventricle has normal function. The left ventricle has no regional  wall motion abnormalities. There is mild left ventricular hypertrophy of  the basal-septal segment. Left  ventricular diastolic parameters are consistent with Grade I diastolic  dysfunction (impaired relaxation).   2. Right ventricular systolic function is normal. The right ventricular  size is mildly enlarged. There is normal pulmonary artery systolic  pressure.   3. Left atrial size was severely dilated.   4. No evidence of mitral valve regurgitation.   5. The aortic valve was not well visualized. Aortic valve regurgitation  is not visualized.   6. The inferior vena cava is normal in size with greater than 50%  respiratory variability, suggesting right atrial pressure of 3 mmHg.       EKG:  EKG is  ordered today.  Sinus bradycardia with PACs, rate 59 bpm  Recent Labs: 10/10/2021: Hemoglobin 14.1; Platelets 339; TSH 1.140 11/05/2021: ALT 19 02/27/2022: BUN 24; Creatinine, Ser 1.04; Potassium 4.3; Sodium 137  Recent Lipid Panel    Component Value Date/Time   CHOL 149 11/05/2021 1147   TRIG 105 11/05/2021 1147   HDL 62 11/05/2021 1147   CHOLHDL 2.4 11/05/2021 1147   CHOLHDL 6.6 02/25/2007 0520   VLDL 29 02/25/2007 0520   LDLCALC 68 11/05/2021 1147    Home Medications   Current Meds   Medication Sig   ACCU-CHEK GUIDE test strip as needed.   Accu-Chek Softclix Lancets lancets    acetaminophen (TYLENOL) 325 MG tablet Take 2 tablets (650 mg total) by mouth every 6 (six) hours as needed for mild pain (pain score 1-3 or temp > 100.5).   cetirizine (ZYRTEC) 10 MG tablet Take 10 mg by mouth daily.    Cholecalciferol (VITAMIN D) 50 MCG (2000 UT) tablet Take 2,000 Units by mouth daily.    clopidogrel (PLAVIX) 75 MG tablet Take 1 tablet (75 mg total) by mouth daily.   docusate sodium (COLACE) 100 MG capsule Take 1 capsule (100 mg total) by mouth 2 (two) times daily. (Patient taking differently: Take 100 mg by mouth as needed for mild constipation.)   DULoxetine (CYMBALTA) 30 MG capsule Take 60 mg by mouth  daily.    EPIPEN 2-PAK 0.3 MG/0.3ML SOAJ injection Inject 0.3 mg into the muscle daily as needed. For anaphylactic allergic reaction   ezetimibe (ZETIA) 10 MG tablet Take 10 mg by mouth daily.   fenofibrate 54 MG tablet Take 54 mg by mouth daily.   folic acid (FOLVITE) 1 MG tablet Take 1 mg by mouth daily.   furosemide (LASIX) 20 MG tablet Take 1 tablet (20 mg total) by mouth daily.   gabapentin (NEURONTIN) 300 MG capsule Take 300 mg by mouth 3 (three) times daily.   HYDROcodone-acetaminophen (NORCO) 7.5-325 MG tablet Take 1-4 tablets by mouth every 6 (six) hours as needed for moderate pain.   hydroxychloroquine (PLAQUENIL) 200 MG tablet Take 200 mg by mouth 2 (two) times daily.   latanoprost (XALATAN) 0.005 % ophthalmic solution Place 1 drop into both eyes at bedtime.    methotrexate (RHEUMATREX) 2.5 MG tablet Take 10 tablets by mouth once a week.   NON FORMULARY Inject 1 Syringe into the skin 2 (two) times a week. Allergy Shots   rosuvastatin (CRESTOR) 5 MG tablet Take 1 tablet (5 mg total) by mouth daily.   spironolactone (ALDACTONE) 25 MG tablet Take 1 tablet (25 mg total) by mouth daily.   Tocilizumab (ACTEMRA) 200 MG/10ML SOLN    topiramate (TOPAMAX) 50 MG tablet TAKE 1  TABLET BY MOUTH EVERYDAY AT BEDTIME   [DISCONTINUED] gabapentin (NEURONTIN) 300 MG capsule Take 300 mg by mouth 2 (two) times daily.     Review of Systems      All other systems reviewed and are otherwise negative except as noted above.  Physical Exam    VS:  BP (!) 140/82   Pulse (!) 59   Ht  (1.499 m)   Wt 186 lb (84.4 kg)   SpO2 98%   BMI 37.57 kg/m  , BMI Body mass index is 37.57 kg/m.  Wt Readings from Last 3 Encounters:  05/27/22 186 lb (84.4 kg)  02/27/22 191 lb (86.6 kg)  11/05/21 182 lb 12.8 oz (82.9 kg)     GEN: Well nourished, well developed, in no acute distress. HEENT: normal. Neck: Supple, no JVD, carotid bruits, or masses. Cardiac: RRR, no murmurs, rubs, or gallops. No clubbing, cyanosis, edema.  Radials/PT 2+ and equal bilaterally.  Respiratory:  Respirations regular and unlabored, clear to auscultation bilaterally. GI: Soft, nontender, nondistended. MS: No deformity or atrophy. Skin: Warm and dry, no rash. Neuro:  Strength and sensation are intact. Psych: Normal affect.  Assessment & Plan    Chronic diastolic CHF -She did not tolerate Jardiance due to diarrhea -Doing well on spironolactone -weight continues to go down -no swelling in ankles  CAD, calcium score greater than 1100 -no chest pains -Continue current medications which include Plavix 75 mg daily, Zetia 10 mg daily, fenofibrate 54 mg daily, Crestor 5 mg daily, spironolactone 25 mg daily  Hyperlipidemia -LDL down less 55 would be the goal -LDL back in May was 68 Repeat lipid panel and LFTs  Fatigue, weakness, aches -stable, still having some SOB  -Optimizing heart failure medications   Disposition: Follow up 1 year  with Meriam Sprague, MD or APP.  Signed, Sharlene Dory, PA-C 05/27/2022, 5:31 PM [facility]

## 2022-05-27 NOTE — Patient Instructions (Signed)
Medication Instructions:  Your physician recommends that you continue on your current medications as directed. Please refer to the Current Medication list given to you today.  *If you need a refill on your cardiac medications before your next appointment, please call your pharmacy*  Lab Work: Lipids and lft's in the fall If you have labs (blood work) drawn today and your tests are completely normal, you will receive your results only by: MyChart Message (if you have MyChart) OR A paper copy in the mail If you have any lab test that is abnormal or we need to change your treatment, we will call you to review the results.  Follow-Up: At [facility], you and your health needs are our priority.  As part of our continuing mission to provide you with exceptional heart care, we have created designated Provider Care Teams.  These Care Teams include your primary Cardiologist (physician) and Advanced Practice Providers (APPs -  Physician Assistants and Nurse Practitioners) who all work together to provide you with the care you need, when you need it.  Your next appointment:   6 month(s)  Provider:   Dr Anne Fu or Jari Favre, PA-C       Other Instructions Check your blood pressure daily, 1-2 hrs after taking your medication for 2 weeks, keep a log and send [place] the readings through mychart at the end of the 2 weeks  Weigh daily, after using the restroom, before breakfast and let us know if you have a weight gain of 2 lbs or more overnight or 5 lbs or more in a week   Heart-Healthy Eating Plan Many factors influence your heart health, including eating and exercise habits. Heart health is also called coronary health. Coronary risk increases with abnormal blood fat (lipid) levels. A heart-healthy eating plan includes limiting unhealthy fats, increasing healthy fats, limiting salt (sodium) intake, and making other diet and lifestyle changes. What is my plan? Your health care provider may  recommend that: You limit your fat intake to _________% or less of your total calories each day. You limit your saturated fat intake to _________% or less of your total calories each day. You limit the amount of cholesterol in your diet to less than _________ mg per day. You limit the amount of sodium in your diet to less than _________ mg per day. What are tips for following this plan? Cooking Cook foods using methods other than frying. Baking, boiling, grilling, and broiling are all good options. Other ways to reduce fat include: Removing the skin from poultry. Removing all visible fats from meats. Steaming vegetables in water or broth. Meal planning  At meals, imagine dividing your plate into fourths: Fill one-half of your plate with vegetables and green salads. Fill one-fourth of your plate with whole grains. Fill one-fourth of your plate with lean protein foods. Eat 2-4 cups of vegetables per day. One cup of vegetables equals 1 cup (91 g) broccoli or cauliflower florets, 2 medium carrots, 1 large bell pepper, 1 large sweet potato, 1 large tomato, 1 medium white potato, 2 cups (150 g) raw leafy greens. Eat 1-2 cups of fruit per day. One cup of fruit equals 1 small apple, 1 large banana, 1 cup (237 g) mixed fruit, 1 large orange,  cup (82 g) dried fruit, 1 cup (240 mL) 100% fruit juice. Eat more foods that contain soluble fiber. Examples include apples, broccoli, carrots, beans, peas, and barley. Aim to get 25-30 g of fiber per day. Increase your consumption  of legumes, nuts, and seeds to 4-5 servings per week. One serving of dried beans or legumes equals  cup (90 g) cooked, 1 serving of nuts is  oz (12 almonds, 24 pistachios, or 7 walnut halves), and 1 serving of seeds equals  oz (8 g). Fats Choose healthy fats more often. Choose monounsaturated and polyunsaturated fats, such as olive and canola oils, avocado oil, flaxseeds, walnuts, almonds, and seeds. Eat more omega-3 fats. Choose  salmon, mackerel, sardines, tuna, flaxseed oil, and ground flaxseeds. Aim to eat fish at least 2 times each week. Check food labels carefully to identify foods with trans fats or high amounts of saturated fat. Limit saturated fats. These are found in animal products, such as meats, butter, and cream. Plant sources of saturated fats include palm oil, palm kernel oil, and coconut oil. Avoid foods with partially hydrogenated oils in them. These contain trans fats. Examples are stick margarine, some tub margarines, cookies, crackers, and other baked goods. Avoid fried foods. General information Eat more home-cooked food and less restaurant, buffet, and fast food. Limit or avoid alcohol. Limit foods that are high in added sugar and simple starches such as foods made using white refined flour (white breads, pastries, sweets). Lose weight if you are overweight. Losing just 5-10% of your body weight can help your overall health and prevent diseases such as diabetes and heart disease. Monitor your sodium intake, especially if you have high blood pressure. Talk with your health care provider about your sodium intake. Try to incorporate more vegetarian meals weekly. What foods should I eat? Fruits All fresh, canned (in natural juice), or frozen fruits. Vegetables Fresh or frozen vegetables (raw, steamed, roasted, or grilled). Green salads. Grains Most grains. Choose whole wheat and whole grains most of the time. Rice and pasta, including brown rice and pastas made with whole wheat. Meats and other proteins Lean, well-trimmed beef, veal, pork, and lamb. Chicken and [place] without skin. All fish and shellfish. Wild duck, rabbit, pheasant, and venison. Egg whites or low-cholesterol egg substitutes. Dried beans, peas, lentils, and tofu. Seeds and most nuts. Dairy Low-fat or nonfat cheeses, including ricotta and mozzarella. Skim or 1% milk (liquid, powdered, or evaporated). Buttermilk made with low-fat milk.  Nonfat or low-fat yogurt. Fats and oils Non-hydrogenated (trans-free) margarines. Vegetable oils, including soybean, sesame, sunflower, olive, avocado, peanut, safflower, corn, canola, and cottonseed. Salad dressings or mayonnaise made with a vegetable oil. Beverages Water (mineral or sparkling). Coffee and tea. Unsweetened ice tea. Diet beverages. Sweets and desserts Sherbet, gelatin, and fruit ice. Small amounts of dark chocolate. Limit all sweets and desserts. Seasonings and condiments All seasonings and condiments. The items listed above may not be a complete list of foods and beverages you can eat. Contact a dietitian for more options. What foods should I avoid? Fruits Canned fruit in heavy syrup. Fruit in cream or butter sauce. Fried fruit. Limit coconut. Vegetables Vegetables cooked in cheese, cream, or butter sauce. Fried vegetables. Grains Breads made with saturated or trans fats, oils, or whole milk. Croissants. Sweet rolls. Donuts. High-fat crackers, such as cheese crackers and chips. Meats and other proteins Fatty meats, such as hot dogs, ribs, sausage, bacon, rib-eye roast or steak. High-fat deli meats, such as salami and bologna. Caviar. Domestic duck and goose. Organ meats, such as liver. Dairy Cream, sour cream, cream cheese, and creamed cottage cheese. Whole-milk cheeses. Whole or 2% milk (liquid, evaporated, or condensed). Whole buttermilk. Cream sauce or high-fat cheese sauce. Whole-milk yogurt. Fats and oils  Meat fat, or shortening. Cocoa butter, hydrogenated oils, palm oil, coconut oil, palm kernel oil. Solid fats and shortenings, including bacon fat, salt pork, lard, and butter. Nondairy cream substitutes. Salad dressings with cheese or sour cream. Beverages Regular sodas and any drinks with added sugar. Sweets and desserts Frosting. Pudding. Cookies. Cakes. Pies. Milk chocolate or white chocolate. Buttered syrups. Full-fat ice cream or ice cream drinks. The items  listed above may not be a complete list of foods and beverages to avoid. Contact a dietitian for more information. Summary Heart-healthy meal planning includes limiting unhealthy fats, increasing healthy fats, limiting salt (sodium) intake and making other diet and lifestyle changes. Lose weight if you are overweight. Losing just 5-10% of your body weight can help your overall health and prevent diseases such as diabetes and heart disease. Focus on eating a balance of foods, including fruits and vegetables, low-fat or nonfat dairy, lean protein, nuts and legumes, whole grains, and heart-healthy oils and fats. This information is not intended to replace advice given to you by your health care provider. Make sure you discuss any questions you have with your health care provider. Document Revised: 02/26/2021 Document Reviewed: 02/26/2021 Elsevier Patient Education  2023 Elsevier Inc.  Low-Sodium Eating Plan Sodium, which is an element that makes up salt, helps you maintain a healthy balance of fluids in your body. Too much sodium can increase your blood pressure and cause fluid and waste to be held in your body. Your health care provider or dietitian may recommend following this plan if you have high blood pressure (hypertension), kidney disease, liver disease, or heart failure. Eating less sodium can help lower your blood pressure, reduce swelling, and protect your heart, liver, and kidneys. What are tips for following this plan? Reading food labels The Nutrition Facts label lists the amount of sodium in one serving of the food. If you eat more than one serving, you must multiply the listed amount of sodium by the number of servings. Choose foods with less than 140 mg of sodium per serving. Avoid foods with 300 mg of sodium or more per serving. Shopping  Look for lower-sodium products, often labeled as "low-sodium" or "no salt added." Always check the sodium content, even if foods are labeled as  "unsalted" or "no salt added." Buy fresh foods. Avoid canned foods and pre-made or frozen meals. Avoid canned, cured, or processed meats. Buy breads that have less than 80 mg of sodium per slice. Cooking  Eat more home-cooked food and less restaurant, buffet, and fast food. Avoid adding salt when cooking. Use salt-free seasonings or herbs instead of table salt or sea salt. Check with your health care provider or pharmacist before using salt substitutes. Cook with plant-based oils, such as canola, sunflower, or olive oil. Meal planning When eating at a restaurant, ask that your food be prepared with less salt or no salt, if possible. Avoid dishes labeled as brined, pickled, cured, smoked, or made with soy sauce, miso, or teriyaki sauce. Avoid foods that contain MSG (monosodium glutamate). MSG is sometimes added to [place] food, bouillon, and some canned foods. Make meals that can be grilled, baked, poached, roasted, or steamed. These are generally made with less sodium. General information Most people on this plan should limit their sodium intake to 1,500-2,000 mg (milligrams) of sodium each day. What foods should I eat? Fruits Fresh, frozen, or canned fruit. Fruit juice. Vegetables Fresh or frozen vegetables. "No salt added" canned vegetables. "No salt added" tomato sauce and paste.  Low-sodium or reduced-sodium tomato and vegetable juice. Grains Low-sodium cereals, including oats, puffed wheat and rice, and shredded wheat. Low-sodium crackers. Unsalted rice. Unsalted pasta. Low-sodium bread. Whole-grain breads and whole-grain pasta. Meats and other proteins Fresh or frozen (no salt added) meat, poultry, seafood, and fish. Low-sodium canned tuna and salmon. Unsalted nuts. Dried peas, beans, and lentils without added salt. Unsalted canned beans. Eggs. Unsalted nut butters. Dairy Milk. Soy milk. Cheese that is naturally low in sodium, such as ricotta cheese, fresh mozzarella, or Swiss cheese.  Low-sodium or reduced-sodium cheese. Cream cheese. Yogurt. Seasonings and condiments Fresh and dried herbs and spices. Salt-free seasonings. Low-sodium mustard and ketchup. Sodium-free salad dressing. Sodium-free light mayonnaise. Fresh or refrigerated horseradish. Lemon juice. Vinegar. Other foods Homemade, reduced-sodium, or low-sodium soups. Unsalted popcorn and pretzels. Low-salt or salt-free chips. The items listed above may not be a complete list of foods and beverages you can eat. Contact a dietitian for more information. What foods should I avoid? Vegetables Sauerkraut, pickled vegetables, and relishes. Olives. [place] fries. Onion rings. Regular canned vegetables (not low-sodium or reduced-sodium). Regular canned tomato sauce and paste (not low-sodium or reduced-sodium). Regular tomato and vegetable juice (not low-sodium or reduced-sodium). Frozen vegetables in sauces. Grains Instant hot cereals. Bread stuffing, pancake, and biscuit mixes. Croutons. Seasoned rice or pasta mixes. Noodle soup cups. Boxed or frozen macaroni and cheese. Regular salted crackers. Self-rising flour. Meats and other proteins Meat or fish that is salted, canned, smoked, spiced, or pickled. Precooked or cured meat, such as sausages or meat loaves. Tomasa Blase. Ham. Pepperoni. Hot dogs. Corned beef. Chipped beef. Salt pork. Jerky. Pickled herring. Anchovies and sardines. Regular canned tuna. Salted nuts. Dairy Processed cheese and cheese spreads. Hard cheeses. Cheese curds. Blue cheese. Feta cheese. String cheese. Regular cottage cheese. Buttermilk. Canned milk. Fats and oils Salted butter. Regular margarine. Ghee. Bacon fat. Seasonings and condiments Onion salt, garlic salt, seasoned salt, table salt, and sea salt. Canned and packaged gravies. Worcestershire sauce. Tartar sauce. Barbecue sauce. Teriyaki sauce. Soy sauce, including reduced-sodium. Steak sauce. Fish sauce. Oyster sauce. Cocktail sauce. Horseradish that you  find on the shelf. Regular ketchup and mustard. Meat flavorings and tenderizers. Bouillon cubes. Hot sauce. Pre-made or packaged marinades. Pre-made or packaged taco seasonings. Relishes. Regular salad dressings. Salsa. Other foods Salted popcorn and pretzels. Corn chips and puffs. Potato and tortilla chips. Canned or dried soups. Pizza. Frozen entrees and pot pies. The items listed above may not be a complete list of foods and beverages you should avoid. Contact a dietitian for more information. Summary Eating less sodium can help lower your blood pressure, reduce swelling, and protect your heart, liver, and kidneys. Most people on this plan should limit their sodium intake to 1,500-2,000 mg (milligrams) of sodium each day. Canned, boxed, and frozen foods are high in sodium. Restaurant foods, fast foods, and pizza are also very high in sodium. You also get sodium by adding salt to food. Try to cook at home, eat more fresh fruits and vegetables, and eat less fast food and canned, processed, or prepared foods. This information is not intended to replace advice given to you by your health care provider. Make sure you discuss any questions you have with your health care provider. Document Revised: 12/28/2018 Document Reviewed: 12/23/2018 Elsevier Patient Education  2023 [employer].

## 2022-05-28 DIAGNOSIS — M1712 Unilateral primary osteoarthritis, left knee: Secondary | ICD-10-CM | POA: Diagnosis not present

## 2022-05-28 DIAGNOSIS — M25551 Pain in right hip: Secondary | ICD-10-CM | POA: Diagnosis not present

## 2022-05-29 DIAGNOSIS — J301 Allergic rhinitis due to pollen: Secondary | ICD-10-CM | POA: Diagnosis not present

## 2022-05-29 DIAGNOSIS — J3089 Other allergic rhinitis: Secondary | ICD-10-CM | POA: Diagnosis not present

## 2022-05-29 DIAGNOSIS — J3081 Allergic rhinitis due to animal (cat) (dog) hair and dander: Secondary | ICD-10-CM | POA: Diagnosis not present

## 2022-06-03 DIAGNOSIS — M47892 Other spondylosis, cervical region: Secondary | ICD-10-CM | POA: Diagnosis not present

## 2022-06-05 DIAGNOSIS — E1136 Type 2 diabetes mellitus with diabetic cataract: Secondary | ICD-10-CM | POA: Diagnosis not present

## 2022-06-05 DIAGNOSIS — E1165 Type 2 diabetes mellitus with hyperglycemia: Secondary | ICD-10-CM | POA: Diagnosis not present

## 2022-06-05 DIAGNOSIS — M7071 Other bursitis of hip, right hip: Secondary | ICD-10-CM | POA: Diagnosis not present

## 2022-06-07 DIAGNOSIS — G894 Chronic pain syndrome: Secondary | ICD-10-CM | POA: Diagnosis not present

## 2022-06-07 DIAGNOSIS — M1712 Unilateral primary osteoarthritis, left knee: Secondary | ICD-10-CM | POA: Diagnosis not present

## 2022-06-09 DIAGNOSIS — R051 Acute cough: Secondary | ICD-10-CM | POA: Diagnosis not present

## 2022-06-11 DIAGNOSIS — Z9989 Dependence on other enabling machines and devices: Secondary | ICD-10-CM | POA: Diagnosis not present

## 2022-06-11 DIAGNOSIS — R051 Acute cough: Secondary | ICD-10-CM | POA: Diagnosis not present

## 2022-06-11 DIAGNOSIS — Z6837 Body mass index (BMI) 37.0-37.9, adult: Secondary | ICD-10-CM | POA: Diagnosis not present

## 2022-06-11 DIAGNOSIS — J209 Acute bronchitis, unspecified: Secondary | ICD-10-CM | POA: Diagnosis not present

## 2022-06-12 DIAGNOSIS — Z79899 Other long term (current) drug therapy: Secondary | ICD-10-CM | POA: Diagnosis not present

## 2022-06-12 DIAGNOSIS — G894 Chronic pain syndrome: Secondary | ICD-10-CM | POA: Diagnosis not present

## 2022-06-12 DIAGNOSIS — Z9181 History of falling: Secondary | ICD-10-CM | POA: Diagnosis not present

## 2022-06-12 DIAGNOSIS — E119 Type 2 diabetes mellitus without complications: Secondary | ICD-10-CM | POA: Diagnosis not present

## 2022-06-12 DIAGNOSIS — F32A Depression, unspecified: Secondary | ICD-10-CM | POA: Diagnosis not present

## 2022-06-12 DIAGNOSIS — M2550 Pain in unspecified joint: Secondary | ICD-10-CM | POA: Diagnosis not present

## 2022-06-12 DIAGNOSIS — E669 Obesity, unspecified: Secondary | ICD-10-CM | POA: Diagnosis not present

## 2022-06-20 DIAGNOSIS — M7672 Peroneal tendinitis, left leg: Secondary | ICD-10-CM | POA: Diagnosis not present

## 2022-06-24 DIAGNOSIS — J301 Allergic rhinitis due to pollen: Secondary | ICD-10-CM | POA: Diagnosis not present

## 2022-06-24 DIAGNOSIS — M1712 Unilateral primary osteoarthritis, left knee: Secondary | ICD-10-CM | POA: Diagnosis not present

## 2022-06-24 DIAGNOSIS — M1611 Unilateral primary osteoarthritis, right hip: Secondary | ICD-10-CM | POA: Diagnosis not present

## 2022-06-24 DIAGNOSIS — J3089 Other allergic rhinitis: Secondary | ICD-10-CM | POA: Diagnosis not present

## 2022-06-24 DIAGNOSIS — J3081 Allergic rhinitis due to animal (cat) (dog) hair and dander: Secondary | ICD-10-CM | POA: Diagnosis not present

## 2022-06-25 DIAGNOSIS — Z79899 Other long term (current) drug therapy: Secondary | ICD-10-CM | POA: Diagnosis not present

## 2022-06-25 DIAGNOSIS — R5383 Other fatigue: Secondary | ICD-10-CM | POA: Diagnosis not present

## 2022-06-25 DIAGNOSIS — M0589 Other rheumatoid arthritis with rheumatoid factor of multiple sites: Secondary | ICD-10-CM | POA: Diagnosis not present

## 2022-06-28 DIAGNOSIS — J3081 Allergic rhinitis due to animal (cat) (dog) hair and dander: Secondary | ICD-10-CM | POA: Diagnosis not present

## 2022-06-28 DIAGNOSIS — G894 Chronic pain syndrome: Secondary | ICD-10-CM | POA: Diagnosis not present

## 2022-06-28 DIAGNOSIS — M1712 Unilateral primary osteoarthritis, left knee: Secondary | ICD-10-CM | POA: Diagnosis not present

## 2022-06-28 DIAGNOSIS — J3089 Other allergic rhinitis: Secondary | ICD-10-CM | POA: Diagnosis not present

## 2022-06-28 DIAGNOSIS — J301 Allergic rhinitis due to pollen: Secondary | ICD-10-CM | POA: Diagnosis not present

## 2022-07-05 ENCOUNTER — Other Ambulatory Visit: Payer: Self-pay | Admitting: Neurology

## 2022-07-05 DIAGNOSIS — J3089 Other allergic rhinitis: Secondary | ICD-10-CM | POA: Diagnosis not present

## 2022-07-05 DIAGNOSIS — J3081 Allergic rhinitis due to animal (cat) (dog) hair and dander: Secondary | ICD-10-CM | POA: Diagnosis not present

## 2022-07-05 DIAGNOSIS — J301 Allergic rhinitis due to pollen: Secondary | ICD-10-CM | POA: Diagnosis not present

## 2022-07-11 DIAGNOSIS — Z79899 Other long term (current) drug therapy: Secondary | ICD-10-CM | POA: Diagnosis not present

## 2022-07-11 DIAGNOSIS — M2550 Pain in unspecified joint: Secondary | ICD-10-CM | POA: Diagnosis not present

## 2022-07-11 DIAGNOSIS — E119 Type 2 diabetes mellitus without complications: Secondary | ICD-10-CM | POA: Diagnosis not present

## 2022-07-11 DIAGNOSIS — G894 Chronic pain syndrome: Secondary | ICD-10-CM | POA: Diagnosis not present

## 2022-07-11 DIAGNOSIS — Z9181 History of falling: Secondary | ICD-10-CM | POA: Diagnosis not present

## 2022-07-11 DIAGNOSIS — E669 Obesity, unspecified: Secondary | ICD-10-CM | POA: Diagnosis not present

## 2022-07-11 DIAGNOSIS — F32A Depression, unspecified: Secondary | ICD-10-CM | POA: Diagnosis not present

## 2022-07-12 DIAGNOSIS — J3081 Allergic rhinitis due to animal (cat) (dog) hair and dander: Secondary | ICD-10-CM | POA: Diagnosis not present

## 2022-07-12 DIAGNOSIS — J3089 Other allergic rhinitis: Secondary | ICD-10-CM | POA: Diagnosis not present

## 2022-07-12 DIAGNOSIS — J301 Allergic rhinitis due to pollen: Secondary | ICD-10-CM | POA: Diagnosis not present

## 2022-07-18 DIAGNOSIS — J301 Allergic rhinitis due to pollen: Secondary | ICD-10-CM | POA: Diagnosis not present

## 2022-07-23 ENCOUNTER — Telehealth: Payer: Self-pay | Admitting: *Deleted

## 2022-07-23 DIAGNOSIS — M1712 Unilateral primary osteoarthritis, left knee: Secondary | ICD-10-CM | POA: Diagnosis not present

## 2022-07-23 DIAGNOSIS — Z96641 Presence of right artificial hip joint: Secondary | ICD-10-CM | POA: Diagnosis not present

## 2022-07-23 DIAGNOSIS — M0589 Other rheumatoid arthritis with rheumatoid factor of multiple sites: Secondary | ICD-10-CM | POA: Diagnosis not present

## 2022-07-23 NOTE — Telephone Encounter (Signed)
S/w the pt and she has been scheduled for tele pre op appt 07/30/22 @ 10 am. Med rec and consent are done.    Patient Consent for Virtual Visit        [name] has provided verbal consent on 07/23/2022 for a virtual visit (video or telephone).   CONSENT FOR VIRTUAL VISIT FOR:  [name]  By participating in this virtual visit I agree to the following:  I hereby voluntarily request, consent and authorize [facility] and its employed or contracted physicians, physician assistants, nurse practitioners or other licensed health care professionals (the Practitioner), to provide me with telemedicine health care services (the "Services") as deemed necessary by the treating Practitioner. I acknowledge and consent to receive the Services by the Practitioner via telemedicine. I understand that the telemedicine visit will involve communicating with the Practitioner through live audiovisual communication technology and the disclosure of certain medical information by electronic transmission. I acknowledge that I have been given the opportunity to request an in-person assessment or other available alternative prior to the telemedicine visit and am voluntarily participating in the telemedicine visit.  I understand that I have the right to withhold or withdraw my consent to the use of telemedicine in the course of my care at any time, without affecting my right to future care or treatment, and that the Practitioner or I may terminate the telemedicine visit at any time. I understand that I have the right to inspect all information obtained and/or recorded in the course of the telemedicine visit and may receive copies of available information for a reasonable fee.  I understand that some of the potential risks of receiving the Services via telemedicine include:  Delay or interruption in medical evaluation due to technological equipment failure or disruption; Information transmitted may  not be sufficient (e.g. poor resolution of images) to allow for appropriate medical decision making by the Practitioner; and/or  In rare instances, security protocols could fail, causing a breach of personal health information.  Furthermore, I acknowledge that it is my responsibility to provide information about my medical history, conditions and care that is complete and accurate to the best of my ability. I acknowledge that Practitioner's advice, recommendations, and/or decision may be based on factors not within their control, such as incomplete or inaccurate data provided by me or distortions of diagnostic images or specimens that may result from electronic transmissions. I understand that the practice of medicine is not an exact science and that Practitioner makes no warranties or guarantees regarding treatment outcomes. I acknowledge that a copy of this consent can be made available to me via my patient portal [facility] MyChart), or I can request a printed copy by calling the office of [facility].    I understand that my insurance will be billed for this visit.   I have read or had this consent read to me. I understand the contents of this consent, which adequately explains the benefits and risks of the Services being provided via telemedicine.  I have been provided ample opportunity to ask questions regarding this consent and the Services and have had my questions answered to my satisfaction. I give my informed consent for the services to be provided through the use of telemedicine in my medical care

## 2022-07-23 NOTE — Telephone Encounter (Signed)
S/w pt is in car.  Will call office to schedule telephone appt. For clearance.

## 2022-07-23 NOTE — Telephone Encounter (Signed)
S/w the pt and she has been scheduled for tele pre op appt 07/30/22 @ 10 am. Med rec and consent are done.

## 2022-07-23 NOTE — Telephone Encounter (Signed)
Patient is following up to to schedule telephone appointment.

## 2022-07-23 NOTE — Telephone Encounter (Signed)
   Name: [name]  DOB: 30-Aug-1945  MRN: [id number]  Primary Cardiologist: Meriam Sprague, MD   Preoperative team, please contact this patient and set up a phone call appointment for further preoperative risk assessment. Please obtain consent and complete medication review. Thank you for your help.  I confirm that guidance regarding antiplatelet and oral anticoagulation therapy has been completed and, if necessary, noted below.  From a cardiology standpoint, patient is okay to hold Plavix for 5 days prior to procedure.  However, it appears patient takes Plavix primarily for history of TIA.  Therefore, final recommendations for holding Plavix prior to procedure should come from managing provider (PCP/neurology).   Joylene Grapes, NP 07/23/2022, 12:22 PM [facility]

## 2022-07-23 NOTE — Telephone Encounter (Signed)
   Pre-operative Risk Assessment    Patient Name: [name]  DOB: 12/12/1945 MRN: [id number]      Request for Surgical Clearance    Procedure:   LEFT TOTAL KNEE ARTHROPLASTY  Date of Surgery:  Clearance TBD                                 Surgeon:  DR. Samson Frederic Surgeon's Group or Practice Name:  [facility] Phone number:  [phone] ATTN: KERRI MAZE Fax number:  [phone]   Type of Clearance Requested:   - Medical ; PLAVIX    Type of Anesthesia:  Spinal   Additional requests/questions:    Elpidio Anis   07/23/2022, 11:59 AM

## 2022-07-29 NOTE — Progress Notes (Unsigned)
Virtual Visit via Telephone Note   Because of [name]'s co-morbid illnesses, she is at least at moderate risk for complications without adequate follow up.  This format is felt to be most appropriate for this patient at this time.  The patient did not have access to video technology/had technical difficulties with video requiring transitioning to audio format only (telephone).  All issues noted in this document were discussed and addressed.  No physical exam could be performed with this format.  Please refer to the patient's chart for her consent to telehealth for [facility]  Evaluation Performed:  Preoperative cardiovascular risk assessment _____________   Date:  07/29/2022   Patient ID:  Amanda, [name] [birth year], MRN [id number] Patient Location:  Home Provider location:   Office  Primary Care Provider:  [facility] [place] Primary Cardiologist:  Meriam Sprague, MD  Chief Complaint / Patient Profile   77 y.o. y/o female with a h/o DVT, elevated troponin, acute bronchitis who is pending left total knee arthroplasty and presents today for telephonic preoperative cardiovascular risk assessment.  History of Present Illness    [name] is a 77 y.o. female who presents via audio/video conferencing for a telehealth visit today.  Pt was last seen in cardiology clinic on 05/27/2022 by Jari Favre, PA-C.  At that time [name] was doing well .  The patient is now pending procedure as outlined above. Since her last visit, she remained stable from a cardiac standpoint.  Today she denies chest pain, shortness of breath, lower extremity edema, fatigue, palpitations, melena, hematuria, hemoptysis, diaphoresis, weakness, presyncope, syncope, orthopnea, and PND.   Past Medical History    Past Medical History:  Diagnosis Date   Chronic fatigue    Depression    Diabetes mellitus without complication (HCC)     Early cataracts, bilateral    Fibromyalgia    Glaucoma    Headache    Vestibular migraines   History of bronchitis    History of pneumonia    Hypercholesterolemia    RA (rheumatoid arthritis) (HCC)    RA (rheumatoid arthritis) (HCC)    TIA (transient ischemic attack)    Vertigo    Wears glasses    Past Surgical History:  Procedure Laterality Date   APPENDECTOMY     BACK SURGERY  2012, 2017   BILATERAL CARPAL TUNNEL RELEASE     CESAREAN SECTION     x3   DILATION AND CURETTAGE OF UTERUS     GLAUCOMA SURGERY     KNEE ARTHROSCOPY Left    MOUTH SURGERY     TONSILLECTOMY     TOTAL HIP ARTHROPLASTY Right 01/30/2017   Procedure: RIGHT TOTAL HIP ARTHROPLASTY ANTERIOR APPROACH;  Surgeon: Kathryne Hitch, MD;  Location: [facility];  Service: Orthopedics;  Laterality: Right;   TOTAL HIP REVISION Right 07/28/2019   Procedure: TOTAL HIP REVISION, posterior approach femoal comp;  Surgeon: Samson Frederic, MD;  Location: [facility] OR;  Service: Orthopedics;  Laterality: Right;    Allergies  Allergies  Allergen Reactions   Leflunomide Diarrhea   Simvastatin     Other Reaction(s): elevated CPK, muscle pain   Sulfa Antibiotics Other (See Comments)    Don't remember     Home Medications    Prior to Admission medications   Medication Sig Start Date End Date Taking? Authorizing Provider  ACCU-CHEK GUIDE test strip as needed. 06/15/21   [provider]  Accu-Chek Softclix Lancets lancets  06/15/21   [provider]  acetaminophen (TYLENOL) 325 MG tablet Take 2 tablets (650 mg total) by mouth every 6 (six) hours as needed for mild pain (pain score 1-3 or temp > 100.5). 08/02/19   Swinteck, Arlys John, MD  cetirizine (ZYRTEC) 10 MG tablet Take 10 mg by mouth daily.     [provider]  Cholecalciferol (VITAMIN D) 50 MCG (2000 UT) tablet Take 2,000 Units by mouth daily.     [provider]  clopidogrel (PLAVIX) 75 MG tablet Take 1 tablet (75 mg total) by mouth daily.  10/03/21   Lyn Records, MD  docusate sodium (COLACE) 100 MG capsule Take 1 capsule (100 mg total) by mouth 2 (two) times daily. Patient taking differently: Take 100 mg by mouth as needed for mild constipation. 08/02/19   Swinteck, Arlys John, MD  DULoxetine (CYMBALTA) 30 MG capsule Take 60 mg by mouth daily.  03/09/19   [provider]  EPIPEN 2-PAK 0.3 MG/0.3ML SOAJ injection Inject 0.3 mg into the muscle daily as needed. For anaphylactic allergic reaction 11/21/16   [provider]  ezetimibe (ZETIA) 10 MG tablet Take 10 mg by mouth daily.    [provider]  fenofibrate 54 MG tablet Take 54 mg by mouth daily.    [provider]  folic acid (FOLVITE) 1 MG tablet Take 1 mg by mouth daily.    [provider]  furosemide (LASIX) 20 MG tablet Take 1 tablet (20 mg total) by mouth daily. 02/27/22   Jake Bathe, MD  gabapentin (NEURONTIN) 300 MG capsule Take 300 mg by mouth 3 (three) times daily.    [provider]  HYDROcodone-acetaminophen (NORCO) 7.5-325 MG tablet Take 1-4 tablets by mouth every 6 (six) hours as needed for moderate pain.    [provider]  hydroxychloroquine (PLAQUENIL) 200 MG tablet Take 200 mg by mouth 2 (two) times daily.    [provider]  latanoprost (XALATAN) 0.005 % ophthalmic solution Place 1 drop into both eyes at bedtime.  06/18/18   [provider]  meloxicam (MOBIC) 7.5 MG tablet Take 1 tablet by mouth daily as needed.    [provider]  methotrexate (RHEUMATREX) 2.5 MG tablet Take 10 tablets by mouth once a week. 08/20/21   [provider]  NON FORMULARY Inject 1 Syringe into the skin 2 (two) times a week. Allergy Shots    [provider]  rosuvastatin (CRESTOR) 5 MG tablet Take 1 tablet (5 mg total) by mouth daily. 10/03/21   Lyn Records, MD  RYBELSUS 7 MG TABS Take 7 mg by mouth daily.    [provider]  spironolactone (ALDACTONE) 25 MG tablet Take 1  tablet (25 mg total) by mouth daily. 10/03/21   Lyn Records, MD  Tocilizumab (ACTEMRA) 200 MG/10ML SOLN  12/27/20   [provider]  topiramate (TOPAMAX) 50 MG tablet Take 1 tablet (50 mg total) by mouth at bedtime. Must be seen for further refills. Call [phone]. 07/08/22   Anson Fret, MD    Physical Exam    Vital Signs:  [name] does not have vital signs available for review today.  Given telephonic nature of communication, physical exam is limited. AAOx3. NAD. Normal affect.  Speech and respirations are unlabored.  Accessory Clinical Findings    None  Assessment & Plan    1.  Preoperative Cardiovascular Risk Assessment: Left total knee arthroplasty, Dr. Samson Frederic, [facility], [phone]  Primary Cardiologist: Meriam Sprague, MD  Chart reviewed as part of pre-operative protocol coverage. Given past medical history and time since last visit, based on ACC/AHA guidelines, [name] would be at acceptable risk for the planned procedure without further cardiovascular testing.   Her RCRI is a class II risk, 0.9% risk of major cardiac event.  She is able to complete greater than 4 METS of physical activity.  From a cardiology standpoint, patient is okay to hold Plavix for 5 days prior to procedure. However, it appears patient takes Plavix primarily for history of TIA. Therefore, final recommendations for holding Plavix prior to procedure should come from managing provider (PCP/neurology).   Patient was advised that if she develops new symptoms prior to surgery to contact our office to arrange a follow-up appointment.  She verbalized understanding.  I will route this recommendation to the requesting party via Epic fax function and remove from pre-op pool.       Time:   Today, I have spent  minutes with the patient with telehealth technology discussing medical history, symptoms, and management plan.  Prior to her phone  evaluation I spent greater than 10 minutes reviewing her past medical history and cardiac medications.   Amanda Asters, NP  07/29/2022, 10:52 AM

## 2022-07-30 ENCOUNTER — Ambulatory Visit: Payer: Medicare PPO | Attending: Cardiology

## 2022-07-30 DIAGNOSIS — Z01810 Encounter for preprocedural cardiovascular examination: Secondary | ICD-10-CM | POA: Diagnosis not present

## 2022-08-15 DIAGNOSIS — J301 Allergic rhinitis due to pollen: Secondary | ICD-10-CM | POA: Diagnosis not present

## 2022-08-15 DIAGNOSIS — J3089 Other allergic rhinitis: Secondary | ICD-10-CM | POA: Diagnosis not present

## 2022-08-15 DIAGNOSIS — J3081 Allergic rhinitis due to animal (cat) (dog) hair and dander: Secondary | ICD-10-CM | POA: Diagnosis not present

## 2022-08-16 DIAGNOSIS — E1136 Type 2 diabetes mellitus with diabetic cataract: Secondary | ICD-10-CM | POA: Diagnosis not present

## 2022-08-20 DIAGNOSIS — M25561 Pain in right knee: Secondary | ICD-10-CM | POA: Diagnosis not present

## 2022-08-20 DIAGNOSIS — E1136 Type 2 diabetes mellitus with diabetic cataract: Secondary | ICD-10-CM | POA: Diagnosis not present

## 2022-08-20 DIAGNOSIS — I10 Essential (primary) hypertension: Secondary | ICD-10-CM | POA: Diagnosis not present

## 2022-08-20 DIAGNOSIS — Z01818 Encounter for other preprocedural examination: Secondary | ICD-10-CM | POA: Diagnosis not present

## 2022-08-20 DIAGNOSIS — M0579 Rheumatoid arthritis with rheumatoid factor of multiple sites without organ or systems involvement: Secondary | ICD-10-CM | POA: Diagnosis not present

## 2022-08-20 DIAGNOSIS — M069 Rheumatoid arthritis, unspecified: Secondary | ICD-10-CM | POA: Diagnosis not present

## 2022-08-21 ENCOUNTER — Telehealth: Payer: Self-pay

## 2022-08-21 NOTE — Telephone Encounter (Signed)
Discussed with preop APP Edd Fabian on 07/30/22, patient has been cleared for her procedure. Will refax notes to surgeons office.

## 2022-08-22 DIAGNOSIS — E669 Obesity, unspecified: Secondary | ICD-10-CM | POA: Diagnosis not present

## 2022-08-22 DIAGNOSIS — M1991 Primary osteoarthritis, unspecified site: Secondary | ICD-10-CM | POA: Diagnosis not present

## 2022-08-22 DIAGNOSIS — Z79899 Other long term (current) drug therapy: Secondary | ICD-10-CM | POA: Diagnosis not present

## 2022-08-22 DIAGNOSIS — M797 Fibromyalgia: Secondary | ICD-10-CM | POA: Diagnosis not present

## 2022-08-22 DIAGNOSIS — M0579 Rheumatoid arthritis with rheumatoid factor of multiple sites without organ or systems involvement: Secondary | ICD-10-CM | POA: Diagnosis not present

## 2022-08-22 DIAGNOSIS — Z6833 Body mass index (BMI) 33.0-33.9, adult: Secondary | ICD-10-CM | POA: Diagnosis not present

## 2022-08-29 DIAGNOSIS — J3081 Allergic rhinitis due to animal (cat) (dog) hair and dander: Secondary | ICD-10-CM | POA: Diagnosis not present

## 2022-08-29 DIAGNOSIS — J301 Allergic rhinitis due to pollen: Secondary | ICD-10-CM | POA: Diagnosis not present

## 2022-08-29 DIAGNOSIS — J3089 Other allergic rhinitis: Secondary | ICD-10-CM | POA: Diagnosis not present

## 2022-09-16 DIAGNOSIS — Z9181 History of falling: Secondary | ICD-10-CM | POA: Diagnosis not present

## 2022-09-16 DIAGNOSIS — F32A Depression, unspecified: Secondary | ICD-10-CM | POA: Diagnosis not present

## 2022-09-16 DIAGNOSIS — E669 Obesity, unspecified: Secondary | ICD-10-CM | POA: Diagnosis not present

## 2022-09-16 DIAGNOSIS — E119 Type 2 diabetes mellitus without complications: Secondary | ICD-10-CM | POA: Diagnosis not present

## 2022-09-16 DIAGNOSIS — G894 Chronic pain syndrome: Secondary | ICD-10-CM | POA: Diagnosis not present

## 2022-09-16 DIAGNOSIS — B029 Zoster without complications: Secondary | ICD-10-CM | POA: Diagnosis not present

## 2022-09-16 DIAGNOSIS — E6609 Other obesity due to excess calories: Secondary | ICD-10-CM | POA: Diagnosis not present

## 2022-09-16 DIAGNOSIS — Z79899 Other long term (current) drug therapy: Secondary | ICD-10-CM | POA: Diagnosis not present

## 2022-09-16 DIAGNOSIS — M2550 Pain in unspecified joint: Secondary | ICD-10-CM | POA: Diagnosis not present

## 2022-09-19 ENCOUNTER — Telehealth (HOSPITAL_COMMUNITY): Payer: Self-pay | Admitting: Physician Assistant

## 2022-09-19 ENCOUNTER — Ambulatory Visit (HOSPITAL_COMMUNITY): Payer: Medicare PPO

## 2022-09-19 NOTE — Telephone Encounter (Signed)
Patient cancelled echocardiogram for 09/19/22 for reason below:  09/18/2022 4:45 PM [id number], [name]  Cancel Rsn: Patient (Not feeling well, will callback to reschedule)   Order will be removed from the echo WQ.

## 2022-10-07 ENCOUNTER — Other Ambulatory Visit: Payer: Self-pay | Admitting: Neurology

## 2022-10-08 ENCOUNTER — Other Ambulatory Visit: Payer: Self-pay

## 2022-10-08 MED ORDER — ROSUVASTATIN CALCIUM 5 MG PO TABS: 5.0000 mg | ORAL_TABLET | Freq: Every day | ORAL | 2 refills | Status: DC

## 2022-10-08 MED ORDER — SPIRONOLACTONE 25 MG PO TABS: 25.0000 mg | ORAL_TABLET | Freq: Every day | ORAL | 2 refills | Status: DC

## 2022-10-08 MED ORDER — CLOPIDOGREL BISULFATE 75 MG PO TABS: 75.0000 mg | ORAL_TABLET | Freq: Every day | ORAL | 2 refills | Status: DC

## 2022-10-08 NOTE — Addendum Note (Signed)
Addended by: Margaret Pyle D on: 10/08/2022 10:13 AM   Modules accepted: Orders

## 2022-10-16 DIAGNOSIS — Z79899 Other long term (current) drug therapy: Secondary | ICD-10-CM | POA: Diagnosis not present

## 2022-10-16 DIAGNOSIS — Z9181 History of falling: Secondary | ICD-10-CM | POA: Diagnosis not present

## 2022-10-16 DIAGNOSIS — G894 Chronic pain syndrome: Secondary | ICD-10-CM | POA: Diagnosis not present

## 2022-10-16 DIAGNOSIS — M069 Rheumatoid arthritis, unspecified: Secondary | ICD-10-CM | POA: Diagnosis not present

## 2022-10-16 DIAGNOSIS — F32A Depression, unspecified: Secondary | ICD-10-CM | POA: Diagnosis not present

## 2022-10-16 DIAGNOSIS — M2550 Pain in unspecified joint: Secondary | ICD-10-CM | POA: Diagnosis not present

## 2022-10-27 ENCOUNTER — Other Ambulatory Visit: Payer: Self-pay | Admitting: Neurology

## 2022-11-02 ENCOUNTER — Other Ambulatory Visit: Payer: Self-pay | Admitting: Neurology

## 2022-11-04 NOTE — Telephone Encounter (Signed)
Pt called and LVM wanting to know when this will be called in for her. Please advise.

## 2022-11-05 ENCOUNTER — Telehealth: Payer: Self-pay | Admitting: Neurology

## 2022-11-05 MED ORDER — TOPIRAMATE 50 MG PO TABS: 50.0000 mg | ORAL_TABLET | Freq: Every day | ORAL | 2 refills | Status: DC

## 2022-11-05 NOTE — Telephone Encounter (Signed)
Pt is requesting a refill for  topiramate (TOPAMAX) 50 MG tablet.  Pharmacy: [facility] [phone]

## 2022-11-07 ENCOUNTER — Other Ambulatory Visit: Payer: Self-pay | Admitting: *Deleted

## 2022-11-07 MED ORDER — TOPIRAMATE 50 MG PO TABS: 50.0000 mg | ORAL_TABLET | Freq: Every day | ORAL | 1 refills | Status: DC

## 2022-11-08 DIAGNOSIS — M1712 Unilateral primary osteoarthritis, left knee: Secondary | ICD-10-CM | POA: Diagnosis not present

## 2022-11-12 DIAGNOSIS — M0589 Other rheumatoid arthritis with rheumatoid factor of multiple sites: Secondary | ICD-10-CM | POA: Diagnosis not present

## 2022-11-12 DIAGNOSIS — Z111 Encounter for screening for respiratory tuberculosis: Secondary | ICD-10-CM | POA: Diagnosis not present

## 2022-11-12 DIAGNOSIS — R5383 Other fatigue: Secondary | ICD-10-CM | POA: Diagnosis not present

## 2022-11-12 DIAGNOSIS — Z79899 Other long term (current) drug therapy: Secondary | ICD-10-CM | POA: Diagnosis not present

## 2022-11-15 DIAGNOSIS — M069 Rheumatoid arthritis, unspecified: Secondary | ICD-10-CM | POA: Diagnosis not present

## 2022-11-15 DIAGNOSIS — Z9181 History of falling: Secondary | ICD-10-CM | POA: Diagnosis not present

## 2022-11-15 DIAGNOSIS — Z79899 Other long term (current) drug therapy: Secondary | ICD-10-CM | POA: Diagnosis not present

## 2022-11-15 DIAGNOSIS — M2550 Pain in unspecified joint: Secondary | ICD-10-CM | POA: Diagnosis not present

## 2022-11-15 DIAGNOSIS — G894 Chronic pain syndrome: Secondary | ICD-10-CM | POA: Diagnosis not present

## 2022-11-15 DIAGNOSIS — E6609 Other obesity due to excess calories: Secondary | ICD-10-CM | POA: Diagnosis not present

## 2022-11-19 DIAGNOSIS — I700 Atherosclerosis of aorta: Secondary | ICD-10-CM | POA: Diagnosis not present

## 2022-11-19 DIAGNOSIS — E559 Vitamin D deficiency, unspecified: Secondary | ICD-10-CM | POA: Diagnosis not present

## 2022-11-19 DIAGNOSIS — E1136 Type 2 diabetes mellitus with diabetic cataract: Secondary | ICD-10-CM | POA: Diagnosis not present

## 2022-11-19 DIAGNOSIS — E7800 Pure hypercholesterolemia, unspecified: Secondary | ICD-10-CM | POA: Diagnosis not present

## 2022-11-20 DIAGNOSIS — Z79899 Other long term (current) drug therapy: Secondary | ICD-10-CM | POA: Diagnosis not present

## 2022-11-20 DIAGNOSIS — E7800 Pure hypercholesterolemia, unspecified: Secondary | ICD-10-CM | POA: Diagnosis not present

## 2022-11-20 DIAGNOSIS — I10 Essential (primary) hypertension: Secondary | ICD-10-CM | POA: Diagnosis not present

## 2022-11-20 DIAGNOSIS — E1165 Type 2 diabetes mellitus with hyperglycemia: Secondary | ICD-10-CM | POA: Diagnosis not present

## 2022-11-20 DIAGNOSIS — E559 Vitamin D deficiency, unspecified: Secondary | ICD-10-CM | POA: Diagnosis not present

## 2022-11-21 DIAGNOSIS — M533 Sacrococcygeal disorders, not elsewhere classified: Secondary | ICD-10-CM | POA: Diagnosis not present

## 2022-11-22 ENCOUNTER — Ambulatory Visit: Payer: Medicare PPO | Attending: Physician Assistant

## 2022-11-22 DIAGNOSIS — R5383 Other fatigue: Secondary | ICD-10-CM

## 2022-11-22 DIAGNOSIS — I5032 Chronic diastolic (congestive) heart failure: Secondary | ICD-10-CM

## 2022-11-22 DIAGNOSIS — M79605 Pain in left leg: Secondary | ICD-10-CM | POA: Diagnosis not present

## 2022-11-22 DIAGNOSIS — I2510 Atherosclerotic heart disease of native coronary artery without angina pectoris: Secondary | ICD-10-CM

## 2022-11-22 DIAGNOSIS — E785 Hyperlipidemia, unspecified: Secondary | ICD-10-CM | POA: Diagnosis not present

## 2022-11-23 LAB — LIPID PANEL
Chol/HDL Ratio: 2.6 {ratio} (ref 0.0–4.4)
Cholesterol, Total: 146 mg/dL (ref 100–199)
HDL: 57 mg/dL (ref >39)
LDL Chol Calc (NIH): 62 mg/dL (ref 0–99)
Triglycerides: 158 mg/dL — ABNORMAL HIGH (ref 0–149)
VLDL Cholesterol Cal: 27 mg/dL (ref 5–40)

## 2022-11-23 LAB — HEPATIC FUNCTION PANEL
ALT: 16 [IU]/L (ref 0–32)
AST: 17 [IU]/L (ref 0–40)
Albumin: 4.6 g/dL (ref 3.8–4.8)
Alkaline Phosphatase: 119 [IU]/L (ref 44–121)
Bilirubin Total: 0.6 mg/dL (ref 0.0–1.2)
Bilirubin, Direct: 0.2 mg/dL (ref 0.00–0.40)
Total Protein: 7.3 g/dL (ref 6.0–8.5)

## 2022-11-26 DIAGNOSIS — L299 Pruritus, unspecified: Secondary | ICD-10-CM | POA: Diagnosis not present

## 2022-11-26 DIAGNOSIS — J3089 Other allergic rhinitis: Secondary | ICD-10-CM | POA: Diagnosis not present

## 2022-11-26 DIAGNOSIS — H1045 Other chronic allergic conjunctivitis: Secondary | ICD-10-CM | POA: Diagnosis not present

## 2022-11-26 DIAGNOSIS — J301 Allergic rhinitis due to pollen: Secondary | ICD-10-CM | POA: Diagnosis not present

## 2022-12-02 DIAGNOSIS — M7061 Trochanteric bursitis, right hip: Secondary | ICD-10-CM | POA: Diagnosis not present

## 2022-12-02 DIAGNOSIS — Z96641 Presence of right artificial hip joint: Secondary | ICD-10-CM | POA: Diagnosis not present

## 2022-12-05 DIAGNOSIS — J301 Allergic rhinitis due to pollen: Secondary | ICD-10-CM | POA: Diagnosis not present

## 2022-12-09 DIAGNOSIS — J3089 Other allergic rhinitis: Secondary | ICD-10-CM | POA: Diagnosis not present

## 2022-12-09 DIAGNOSIS — J301 Allergic rhinitis due to pollen: Secondary | ICD-10-CM | POA: Diagnosis not present

## 2022-12-09 DIAGNOSIS — J3081 Allergic rhinitis due to animal (cat) (dog) hair and dander: Secondary | ICD-10-CM | POA: Diagnosis not present

## 2022-12-10 DIAGNOSIS — M0589 Other rheumatoid arthritis with rheumatoid factor of multiple sites: Secondary | ICD-10-CM | POA: Diagnosis not present

## 2022-12-12 DIAGNOSIS — J3081 Allergic rhinitis due to animal (cat) (dog) hair and dander: Secondary | ICD-10-CM | POA: Diagnosis not present

## 2022-12-12 DIAGNOSIS — J301 Allergic rhinitis due to pollen: Secondary | ICD-10-CM | POA: Diagnosis not present

## 2022-12-12 DIAGNOSIS — J3089 Other allergic rhinitis: Secondary | ICD-10-CM | POA: Diagnosis not present

## 2022-12-16 DIAGNOSIS — M545 Low back pain, unspecified: Secondary | ICD-10-CM | POA: Diagnosis not present

## 2022-12-16 DIAGNOSIS — Z79899 Other long term (current) drug therapy: Secondary | ICD-10-CM | POA: Diagnosis not present

## 2022-12-16 DIAGNOSIS — E6609 Other obesity due to excess calories: Secondary | ICD-10-CM | POA: Diagnosis not present

## 2022-12-16 DIAGNOSIS — M069 Rheumatoid arthritis, unspecified: Secondary | ICD-10-CM | POA: Diagnosis not present

## 2022-12-16 DIAGNOSIS — M79604 Pain in right leg: Secondary | ICD-10-CM | POA: Diagnosis not present

## 2022-12-16 DIAGNOSIS — G894 Chronic pain syndrome: Secondary | ICD-10-CM | POA: Diagnosis not present

## 2022-12-16 DIAGNOSIS — M25551 Pain in right hip: Secondary | ICD-10-CM | POA: Diagnosis not present

## 2022-12-18 DIAGNOSIS — J301 Allergic rhinitis due to pollen: Secondary | ICD-10-CM | POA: Diagnosis not present

## 2022-12-19 DIAGNOSIS — Z79899 Other long term (current) drug therapy: Secondary | ICD-10-CM | POA: Diagnosis not present

## 2022-12-26 DIAGNOSIS — M5416 Radiculopathy, lumbar region: Secondary | ICD-10-CM | POA: Diagnosis not present

## 2022-12-26 DIAGNOSIS — Z6833 Body mass index (BMI) 33.0-33.9, adult: Secondary | ICD-10-CM | POA: Diagnosis not present

## 2022-12-27 ENCOUNTER — Other Ambulatory Visit: Payer: Self-pay | Admitting: Neurological Surgery

## 2022-12-27 DIAGNOSIS — J301 Allergic rhinitis due to pollen: Secondary | ICD-10-CM | POA: Diagnosis not present

## 2022-12-27 DIAGNOSIS — J3089 Other allergic rhinitis: Secondary | ICD-10-CM | POA: Diagnosis not present

## 2022-12-27 DIAGNOSIS — M5416 Radiculopathy, lumbar region: Secondary | ICD-10-CM

## 2022-12-30 DIAGNOSIS — E1165 Type 2 diabetes mellitus with hyperglycemia: Secondary | ICD-10-CM | POA: Diagnosis not present

## 2023-01-08 DIAGNOSIS — M0589 Other rheumatoid arthritis with rheumatoid factor of multiple sites: Secondary | ICD-10-CM | POA: Diagnosis not present

## 2023-01-13 DIAGNOSIS — M069 Rheumatoid arthritis, unspecified: Secondary | ICD-10-CM | POA: Diagnosis not present

## 2023-01-13 DIAGNOSIS — E6609 Other obesity due to excess calories: Secondary | ICD-10-CM | POA: Diagnosis not present

## 2023-01-13 DIAGNOSIS — M79604 Pain in right leg: Secondary | ICD-10-CM | POA: Diagnosis not present

## 2023-01-13 DIAGNOSIS — M545 Low back pain, unspecified: Secondary | ICD-10-CM | POA: Diagnosis not present

## 2023-01-13 DIAGNOSIS — Z79899 Other long term (current) drug therapy: Secondary | ICD-10-CM | POA: Diagnosis not present

## 2023-01-13 DIAGNOSIS — G894 Chronic pain syndrome: Secondary | ICD-10-CM | POA: Diagnosis not present

## 2023-01-13 DIAGNOSIS — M25551 Pain in right hip: Secondary | ICD-10-CM | POA: Diagnosis not present

## 2023-01-17 DIAGNOSIS — J3081 Allergic rhinitis due to animal (cat) (dog) hair and dander: Secondary | ICD-10-CM | POA: Diagnosis not present

## 2023-01-17 DIAGNOSIS — J301 Allergic rhinitis due to pollen: Secondary | ICD-10-CM | POA: Diagnosis not present

## 2023-01-17 DIAGNOSIS — J3089 Other allergic rhinitis: Secondary | ICD-10-CM | POA: Diagnosis not present

## 2023-01-21 ENCOUNTER — Other Ambulatory Visit: Payer: Self-pay | Admitting: Cardiology

## 2023-01-23 DIAGNOSIS — Z96641 Presence of right artificial hip joint: Secondary | ICD-10-CM | POA: Diagnosis not present

## 2023-01-23 DIAGNOSIS — Z471 Aftercare following joint replacement surgery: Secondary | ICD-10-CM | POA: Diagnosis not present

## 2023-01-23 DIAGNOSIS — M1712 Unilateral primary osteoarthritis, left knee: Secondary | ICD-10-CM | POA: Diagnosis not present

## 2023-01-30 DIAGNOSIS — R350 Frequency of micturition: Secondary | ICD-10-CM | POA: Diagnosis not present

## 2023-02-08 ENCOUNTER — Other Ambulatory Visit: Payer: Medicare PPO

## 2023-02-11 DIAGNOSIS — M0579 Rheumatoid arthritis with rheumatoid factor of multiple sites without organ or systems involvement: Secondary | ICD-10-CM | POA: Diagnosis not present

## 2023-02-11 DIAGNOSIS — Z79899 Other long term (current) drug therapy: Secondary | ICD-10-CM | POA: Diagnosis not present

## 2023-02-13 DIAGNOSIS — R300 Dysuria: Secondary | ICD-10-CM | POA: Diagnosis not present

## 2023-02-13 DIAGNOSIS — N3281 Overactive bladder: Secondary | ICD-10-CM | POA: Diagnosis not present

## 2023-02-13 DIAGNOSIS — Z6836 Body mass index (BMI) 36.0-36.9, adult: Secondary | ICD-10-CM | POA: Diagnosis not present

## 2023-02-13 DIAGNOSIS — L239 Allergic contact dermatitis, unspecified cause: Secondary | ICD-10-CM | POA: Diagnosis not present

## 2023-02-13 DIAGNOSIS — R350 Frequency of micturition: Secondary | ICD-10-CM | POA: Diagnosis not present

## 2023-02-18 DIAGNOSIS — L239 Allergic contact dermatitis, unspecified cause: Secondary | ICD-10-CM | POA: Diagnosis not present

## 2023-02-18 DIAGNOSIS — I10 Essential (primary) hypertension: Secondary | ICD-10-CM | POA: Diagnosis not present

## 2023-02-19 DIAGNOSIS — E1165 Type 2 diabetes mellitus with hyperglycemia: Secondary | ICD-10-CM | POA: Diagnosis not present

## 2023-02-25 DIAGNOSIS — R21 Rash and other nonspecific skin eruption: Secondary | ICD-10-CM | POA: Diagnosis not present

## 2023-02-25 DIAGNOSIS — M797 Fibromyalgia: Secondary | ICD-10-CM | POA: Diagnosis not present

## 2023-02-25 DIAGNOSIS — E669 Obesity, unspecified: Secondary | ICD-10-CM | POA: Diagnosis not present

## 2023-02-25 DIAGNOSIS — M1991 Primary osteoarthritis, unspecified site: Secondary | ICD-10-CM | POA: Diagnosis not present

## 2023-02-25 DIAGNOSIS — Z79899 Other long term (current) drug therapy: Secondary | ICD-10-CM | POA: Diagnosis not present

## 2023-02-25 DIAGNOSIS — Z6833 Body mass index (BMI) 33.0-33.9, adult: Secondary | ICD-10-CM | POA: Diagnosis not present

## 2023-02-25 DIAGNOSIS — M0589 Other rheumatoid arthritis with rheumatoid factor of multiple sites: Secondary | ICD-10-CM | POA: Diagnosis not present

## 2023-02-28 DIAGNOSIS — Z6837 Body mass index (BMI) 37.0-37.9, adult: Secondary | ICD-10-CM | POA: Diagnosis not present

## 2023-02-28 DIAGNOSIS — L239 Allergic contact dermatitis, unspecified cause: Secondary | ICD-10-CM | POA: Diagnosis not present

## 2023-03-05 DIAGNOSIS — M069 Rheumatoid arthritis, unspecified: Secondary | ICD-10-CM | POA: Diagnosis not present

## 2023-03-05 DIAGNOSIS — E7800 Pure hypercholesterolemia, unspecified: Secondary | ICD-10-CM | POA: Diagnosis not present

## 2023-03-05 DIAGNOSIS — E119 Type 2 diabetes mellitus without complications: Secondary | ICD-10-CM | POA: Diagnosis not present

## 2023-03-05 DIAGNOSIS — I5032 Chronic diastolic (congestive) heart failure: Secondary | ICD-10-CM | POA: Diagnosis not present

## 2023-03-05 DIAGNOSIS — I10 Essential (primary) hypertension: Secondary | ICD-10-CM | POA: Diagnosis not present

## 2023-03-05 DIAGNOSIS — N3281 Overactive bladder: Secondary | ICD-10-CM | POA: Diagnosis not present

## 2023-03-11 DIAGNOSIS — M0589 Other rheumatoid arthritis with rheumatoid factor of multiple sites: Secondary | ICD-10-CM | POA: Diagnosis not present

## 2023-03-24 ENCOUNTER — Telehealth: Payer: Self-pay | Admitting: Neurology

## 2023-03-24 NOTE — Telephone Encounter (Signed)
I called pt and LMVM for her that was returning her call.

## 2023-03-24 NOTE — Telephone Encounter (Signed)
Pt said having pain back of head on the right side, trouble with balance, headache across the eyes.  Pt said do not think is a migraine. Advise patient to see PCP due having new symptom.

## 2023-03-25 NOTE — Telephone Encounter (Signed)
I called and LMVM for pt that returned call again.  She is to call back if still needed.

## 2023-03-28 DIAGNOSIS — Z96641 Presence of right artificial hip joint: Secondary | ICD-10-CM | POA: Diagnosis not present

## 2023-03-28 DIAGNOSIS — M1712 Unilateral primary osteoarthritis, left knee: Secondary | ICD-10-CM | POA: Diagnosis not present

## 2023-03-28 DIAGNOSIS — Z471 Aftercare following joint replacement surgery: Secondary | ICD-10-CM | POA: Diagnosis not present

## 2023-04-01 DIAGNOSIS — M79604 Pain in right leg: Secondary | ICD-10-CM | POA: Diagnosis not present

## 2023-04-01 DIAGNOSIS — E6609 Other obesity due to excess calories: Secondary | ICD-10-CM | POA: Diagnosis not present

## 2023-04-01 DIAGNOSIS — Z79899 Other long term (current) drug therapy: Secondary | ICD-10-CM | POA: Diagnosis not present

## 2023-04-01 DIAGNOSIS — G8929 Other chronic pain: Secondary | ICD-10-CM | POA: Diagnosis not present

## 2023-04-01 DIAGNOSIS — M25551 Pain in right hip: Secondary | ICD-10-CM | POA: Diagnosis not present

## 2023-04-01 DIAGNOSIS — M545 Low back pain, unspecified: Secondary | ICD-10-CM | POA: Diagnosis not present

## 2023-04-02 ENCOUNTER — Telehealth: Payer: Self-pay | Admitting: Cardiology

## 2023-04-02 NOTE — Telephone Encounter (Signed)
 Spoke with pt who reports having shortness of breath with activity only (not at rest) for the past 2-3 weeks.  She denies any new edema, no cough, no wt gain and no chest pain. She reports having history of many environmental allergies.  She is taking Furosemide 20 mg daily and Spironolactone 25 mg daily. No recent lab other than HA1c. Advised to start weighing herself daily and monitoring her fluid/sodium intake.  Advised I will forward this to Dr Anne Fu for his review and she will be called back with any new instructions.

## 2023-04-02 NOTE — Telephone Encounter (Signed)
 Pt c/o Shortness Of Breath: STAT if SOB developed within the last 24 hours or pt is noticeably SOB on the phone  1. Are you currently SOB (can you hear that pt is SOB on the phone)? No   2. How long have you been experiencing SOB? 2 weeks   3. Are you SOB when sitting or when up moving around? Moving around   4. Are you currently experiencing any other symptoms? No

## 2023-04-04 DIAGNOSIS — M25551 Pain in right hip: Secondary | ICD-10-CM | POA: Diagnosis not present

## 2023-04-04 NOTE — Telephone Encounter (Signed)
 Jake Bathe, MD  You23 hours ago (10:24 AM)    Increase lasix to 40mg  once a day for the next 7 days. Go back to 20mg  after that. Donato Schultz, MD   Attempted to contact patient regarding the above order.  Left message on voicemail (OK per DPR) of instructions and to call back if any questions or concerns.

## 2023-04-04 NOTE — Telephone Encounter (Signed)
 Attempted again to contact pt regarding earlier message.  No answer.  Call went to voicemail.  Did not leave another message.

## 2023-04-08 DIAGNOSIS — M0589 Other rheumatoid arthritis with rheumatoid factor of multiple sites: Secondary | ICD-10-CM | POA: Diagnosis not present

## 2023-04-09 DIAGNOSIS — R519 Headache, unspecified: Secondary | ICD-10-CM | POA: Diagnosis not present

## 2023-04-11 ENCOUNTER — Other Ambulatory Visit: Payer: Self-pay | Admitting: Family Medicine

## 2023-04-11 DIAGNOSIS — R519 Headache, unspecified: Secondary | ICD-10-CM

## 2023-04-11 NOTE — Telephone Encounter (Signed)
 Pt is not responding to messages left on voicemail.  Will close this encounter for now and await a call back with any further needs.

## 2023-04-14 ENCOUNTER — Other Ambulatory Visit

## 2023-04-14 DIAGNOSIS — Z23 Encounter for immunization: Secondary | ICD-10-CM | POA: Diagnosis not present

## 2023-04-14 DIAGNOSIS — Z6837 Body mass index (BMI) 37.0-37.9, adult: Secondary | ICD-10-CM | POA: Diagnosis not present

## 2023-04-14 DIAGNOSIS — Z0000 Encounter for general adult medical examination without abnormal findings: Secondary | ICD-10-CM | POA: Diagnosis not present

## 2023-04-15 DIAGNOSIS — M7061 Trochanteric bursitis, right hip: Secondary | ICD-10-CM | POA: Diagnosis not present

## 2023-04-15 DIAGNOSIS — M170 Bilateral primary osteoarthritis of knee: Secondary | ICD-10-CM | POA: Diagnosis not present

## 2023-04-17 ENCOUNTER — Ambulatory Visit
Admission: RE | Admit: 2023-04-17 | Discharge: 2023-04-17 | Disposition: A | Discharge status: Home / Self Care | Source: Ambulatory Visit | Attending: Family Medicine | Admitting: Family Medicine

## 2023-04-17 DIAGNOSIS — R519 Headache, unspecified: Secondary | ICD-10-CM

## 2023-04-23 DIAGNOSIS — J301 Allergic rhinitis due to pollen: Secondary | ICD-10-CM | POA: Diagnosis not present

## 2023-04-25 DIAGNOSIS — J3089 Other allergic rhinitis: Secondary | ICD-10-CM | POA: Diagnosis not present

## 2023-04-25 DIAGNOSIS — J3081 Allergic rhinitis due to animal (cat) (dog) hair and dander: Secondary | ICD-10-CM | POA: Diagnosis not present

## 2023-04-25 DIAGNOSIS — J301 Allergic rhinitis due to pollen: Secondary | ICD-10-CM | POA: Diagnosis not present

## 2023-04-28 DIAGNOSIS — J3089 Other allergic rhinitis: Secondary | ICD-10-CM | POA: Diagnosis not present

## 2023-04-28 DIAGNOSIS — J301 Allergic rhinitis due to pollen: Secondary | ICD-10-CM | POA: Diagnosis not present

## 2023-04-28 DIAGNOSIS — J3081 Allergic rhinitis due to animal (cat) (dog) hair and dander: Secondary | ICD-10-CM | POA: Diagnosis not present

## 2023-04-29 DIAGNOSIS — E119 Type 2 diabetes mellitus without complications: Secondary | ICD-10-CM | POA: Diagnosis not present

## 2023-04-29 DIAGNOSIS — M25551 Pain in right hip: Secondary | ICD-10-CM | POA: Diagnosis not present

## 2023-04-29 DIAGNOSIS — M79604 Pain in right leg: Secondary | ICD-10-CM | POA: Diagnosis not present

## 2023-04-29 DIAGNOSIS — Z79899 Other long term (current) drug therapy: Secondary | ICD-10-CM | POA: Diagnosis not present

## 2023-04-29 DIAGNOSIS — E6609 Other obesity due to excess calories: Secondary | ICD-10-CM | POA: Diagnosis not present

## 2023-04-29 DIAGNOSIS — G894 Chronic pain syndrome: Secondary | ICD-10-CM | POA: Diagnosis not present

## 2023-04-29 DIAGNOSIS — I10 Essential (primary) hypertension: Secondary | ICD-10-CM | POA: Diagnosis not present

## 2023-04-29 DIAGNOSIS — M545 Low back pain, unspecified: Secondary | ICD-10-CM | POA: Diagnosis not present

## 2023-04-29 DIAGNOSIS — E7800 Pure hypercholesterolemia, unspecified: Secondary | ICD-10-CM | POA: Diagnosis not present

## 2023-04-30 DIAGNOSIS — J301 Allergic rhinitis due to pollen: Secondary | ICD-10-CM | POA: Diagnosis not present

## 2023-04-30 DIAGNOSIS — J3089 Other allergic rhinitis: Secondary | ICD-10-CM | POA: Diagnosis not present

## 2023-05-01 ENCOUNTER — Other Ambulatory Visit: Payer: Self-pay | Admitting: Cardiology

## 2023-05-01 DIAGNOSIS — Z79899 Other long term (current) drug therapy: Secondary | ICD-10-CM | POA: Diagnosis not present

## 2023-05-06 DIAGNOSIS — J301 Allergic rhinitis due to pollen: Secondary | ICD-10-CM | POA: Diagnosis not present

## 2023-05-07 DIAGNOSIS — M0589 Other rheumatoid arthritis with rheumatoid factor of multiple sites: Secondary | ICD-10-CM | POA: Diagnosis not present

## 2023-05-08 DIAGNOSIS — J3081 Allergic rhinitis due to animal (cat) (dog) hair and dander: Secondary | ICD-10-CM | POA: Diagnosis not present

## 2023-05-08 DIAGNOSIS — J3089 Other allergic rhinitis: Secondary | ICD-10-CM | POA: Diagnosis not present

## 2023-05-08 DIAGNOSIS — J301 Allergic rhinitis due to pollen: Secondary | ICD-10-CM | POA: Diagnosis not present

## 2023-05-12 DIAGNOSIS — E1136 Type 2 diabetes mellitus with diabetic cataract: Secondary | ICD-10-CM | POA: Diagnosis not present

## 2023-05-13 ENCOUNTER — Telehealth: Payer: Self-pay | Admitting: Adult Health

## 2023-05-13 DIAGNOSIS — M1712 Unilateral primary osteoarthritis, left knee: Secondary | ICD-10-CM | POA: Diagnosis not present

## 2023-05-13 NOTE — Telephone Encounter (Signed)
 LVM and sent mychart msg informing pt of need to reschedule 07/09/23 appt - NP schedule change

## 2023-05-14 DIAGNOSIS — J301 Allergic rhinitis due to pollen: Secondary | ICD-10-CM | POA: Diagnosis not present

## 2023-05-20 DIAGNOSIS — M1712 Unilateral primary osteoarthritis, left knee: Secondary | ICD-10-CM | POA: Diagnosis not present

## 2023-05-21 DIAGNOSIS — J3089 Other allergic rhinitis: Secondary | ICD-10-CM | POA: Diagnosis not present

## 2023-05-21 DIAGNOSIS — J301 Allergic rhinitis due to pollen: Secondary | ICD-10-CM | POA: Diagnosis not present

## 2023-05-21 DIAGNOSIS — J3081 Allergic rhinitis due to animal (cat) (dog) hair and dander: Secondary | ICD-10-CM | POA: Diagnosis not present

## 2023-05-27 DIAGNOSIS — M1712 Unilateral primary osteoarthritis, left knee: Secondary | ICD-10-CM | POA: Diagnosis not present

## 2023-06-02 DIAGNOSIS — F1120 Opioid dependence, uncomplicated: Secondary | ICD-10-CM | POA: Diagnosis not present

## 2023-06-02 DIAGNOSIS — M069 Rheumatoid arthritis, unspecified: Secondary | ICD-10-CM | POA: Diagnosis not present

## 2023-06-02 DIAGNOSIS — N1831 Chronic kidney disease, stage 3a: Secondary | ICD-10-CM | POA: Diagnosis not present

## 2023-06-04 DIAGNOSIS — M0589 Other rheumatoid arthritis with rheumatoid factor of multiple sites: Secondary | ICD-10-CM | POA: Diagnosis not present

## 2023-06-06 DIAGNOSIS — J3081 Allergic rhinitis due to animal (cat) (dog) hair and dander: Secondary | ICD-10-CM | POA: Diagnosis not present

## 2023-06-06 DIAGNOSIS — J301 Allergic rhinitis due to pollen: Secondary | ICD-10-CM | POA: Diagnosis not present

## 2023-06-06 DIAGNOSIS — J3089 Other allergic rhinitis: Secondary | ICD-10-CM | POA: Diagnosis not present

## 2023-06-12 DIAGNOSIS — M79604 Pain in right leg: Secondary | ICD-10-CM | POA: Diagnosis not present

## 2023-06-12 DIAGNOSIS — E119 Type 2 diabetes mellitus without complications: Secondary | ICD-10-CM | POA: Diagnosis not present

## 2023-06-12 DIAGNOSIS — E6609 Other obesity due to excess calories: Secondary | ICD-10-CM | POA: Diagnosis not present

## 2023-06-12 DIAGNOSIS — M545 Low back pain, unspecified: Secondary | ICD-10-CM | POA: Diagnosis not present

## 2023-06-12 DIAGNOSIS — K5909 Other constipation: Secondary | ICD-10-CM | POA: Diagnosis not present

## 2023-06-12 DIAGNOSIS — I10 Essential (primary) hypertension: Secondary | ICD-10-CM | POA: Diagnosis not present

## 2023-06-12 DIAGNOSIS — Z79899 Other long term (current) drug therapy: Secondary | ICD-10-CM | POA: Diagnosis not present

## 2023-06-22 ENCOUNTER — Other Ambulatory Visit: Payer: Self-pay | Admitting: Physician Assistant

## 2023-06-25 DIAGNOSIS — J301 Allergic rhinitis due to pollen: Secondary | ICD-10-CM | POA: Diagnosis not present

## 2023-06-27 DIAGNOSIS — J301 Allergic rhinitis due to pollen: Secondary | ICD-10-CM | POA: Diagnosis not present

## 2023-07-04 ENCOUNTER — Encounter: Payer: Self-pay | Admitting: Cardiology

## 2023-07-04 ENCOUNTER — Ambulatory Visit: Payer: Medicare PPO | Attending: Cardiology | Admitting: Cardiology

## 2023-07-04 VITALS — BP 127/69 | HR 66 | Ht 59.0 in | Wt 185.0 lb

## 2023-07-04 DIAGNOSIS — I2510 Atherosclerotic heart disease of native coronary artery without angina pectoris: Secondary | ICD-10-CM | POA: Diagnosis not present

## 2023-07-04 DIAGNOSIS — E785 Hyperlipidemia, unspecified: Secondary | ICD-10-CM | POA: Diagnosis not present

## 2023-07-04 DIAGNOSIS — I5032 Chronic diastolic (congestive) heart failure: Secondary | ICD-10-CM

## 2023-07-04 NOTE — Progress Notes (Signed)
 Cardiology Office Note:  .   Date:  07/04/2023  ID:  [name], DOB Jun 22, 1945, MRN [id number] PCP: [facility] Providers Cardiologist:  Sonny Dust, MD (Inactive)     History of Present Illness: .   [name] is a 78 y.o. female Discussed the use of AI scribe software for clinical note transcription with the patient, who gave verbal consent to proceed.  History of Present Illness [name] "Bartholomew Light" is a 78 year old female with coronary artery disease and diastolic dysfunction who presents with increased shortness of breath and dizziness.  She experiences increased shortness of breath, which persists despite an adjustment in her furosemide  (Lasix ) dosage to 40 mg once daily for seven days, then returning to 20 mg. The dyspnea is particularly noticeable during long-distance walking, necessitating frequent stops to catch her breath. No pain accompanies these episodes.  She describes episodes of dizziness, especially when brushing her teeth or combing her hair, and experiences spells of feeling unwell without pain. She also reports sudden episodes of feeling like she is falling backwards, which are not associated with specific movements or positions.  Her past medical history includes a coronary CT scan in 2023 showing a calcium  score of 1133 with moderate calcified plaque in the proximal to mid LAD, but flow analysis showed nonobstructive disease. An echocardiogram in 2023 showed normal ejection fraction. She recalls being told there were some abnormalities, but does not specify details.  She is currently on multiple medications for her conditions, including Plavix  (clopidogrel ), Zetia  (ezetimibe ) 10 mg, fenofibrate  54 mg, and Crestor  5 mg for cholesterol management. She also takes furosemide  (Lasix ) for fluid management. She mentions that taking extra Lasix  makes her feel 'odd'.  She has a history of  rheumatoid arthritis, which causes swelling. She has had two back surgeries and two hip surgeries, which limit her ability to put on support stockings or socks. She also mentions needing a knee replacement and is currently facing insurance requirements for physical therapy before an MRI can be approved.  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 She mentions gaining weight and having difficulty with physical activity, which she attributes to her sedentary lifestyle and knee issues.      Studies Reviewed: Aaron Aas    EKG Interpretation Date/Time:  Friday Jul 04 2023 11:08:32 EDT Ventricular Rate:  66 PR Interval:  180 QRS Duration:  90 QT Interval:  306 QTC Calculation: 320 R Axis:   -41  Text Interpretation: Normal sinus rhythm Left axis deviation Anterior infarct (cited on or before 24-Jan-2017) When compared with ECG of 14-Sep-2019 17:40, Nonspecific T wave abnormality, worse in Lateral leads QT has shortened Confirmed by Dorothye Gathers ([id number]) on 07/04/2023 11:12:52 AM    Results RADIOLOGY Coronary CT scan: Calcium  score of 1133 with moderate calcified plaque in the proximal to mid LAD. Flow analysis showed nonobstructive disease. (2023)  DIAGNOSTIC Echocardiogram: Normal ejection fraction with mild diastolic dysfunction. Left atrium dilated. (2023)  Risk Assessment/Calculations:            Physical Exam:   VS:  BP 127/69   Pulse 66   Ht 4\' 11"  (1.499 m)   Wt 185 lb (83.9 kg)   SpO2 94%   BMI 37.37 kg/m    Wt Readings from Last 3 Encounters:  07/04/23 185 lb (83.9 kg)  05/27/22 186 lb (84.4 kg)  02/27/22 191 lb (86.6  kg)    GEN: Well nourished, well developed in no acute distress NECK: No JVD; No carotid bruits CARDIAC: RRR, no murmurs, no rubs, no gallops RESPIRATORY:  Clear to auscultation without rales, wheezing or rhonchi  ABDOMEN: Soft, non-tender, non-distended EXTREMITIES:  Mild chronic LE edema; No  deformity   ASSESSMENT AND PLAN: .    Assessment and Plan Assessment & Plan Chronic diastolic heart failure Mild diastolic dysfunction with normal ejection fraction, contributing to exercise intolerance and dyspnea on exertion. Symptoms include shortness of breath and fatigue. Discontinuing spironolactone  may improve dizziness and balance issues. - Discontinue spironolactone  to assess for improvement in symptoms. - Encourage regular physical activity as tolerated to improve conditioning.  Coronary artery disease with calcified plaque Coronary artery disease with a calcium  score of 1133 and moderate calcified plaque in the proximal to mid LAD. Nonobstructive disease on flow analysis. Managed with multiple lipid-lowering agents. - Continue current medications: clopidogrel , ezetimibe , fenofibrate , and rosuvastatin .  Hyperlipidemia Hyperlipidemia managed with multiple medications. She expresses desire to reduce medication burden, but current regimen is necessary for control. - Continue current lipid-lowering therapy: ezetimibe , fenofibrate , and rosuvastatin .  Lower extremity edema Persistent lower extremity edema, likely multifactorial including venous insufficiency and possible contribution from rheumatoid arthritis. Previous increase in furosemide  dosage was not well tolerated, causing discomfort. - Continue current dose of furosemide  and advise on reducing dietary salt intake to manage fluid retention.  Dizziness and balance issues Episodes of dizziness and imbalance, possibly related to medication effects or orthostatic hypotension. Spironolactone  may contribute to low blood pressure and dizziness. - Discontinue spironolactone  to assess for improvement in dizziness and balance issues.  Epistaxis Recurrent epistaxis, possibly exacerbated by clopidogrel  use. History of nasal cauterization for severe episodes. - Monitor frequency and severity of epistaxis.          Signed, Dorothye Gathers, MD

## 2023-07-04 NOTE — Patient Instructions (Addendum)
 Medication Instructions:  Please discontinue your Spironolactone . Continue all other medications as listed.  *If you need a refill on your cardiac medications before your next appointment, please call your pharmacy*  Follow-Up: At [facility], you and your health needs are our priority.  As part of our continuing mission to provide you with exceptional heart care, our providers are all part of one team.  This team includes your primary Cardiologist (physician) and Advanced Practice Providers or APPs (Physician Assistants and Nurse Practitioners) who all work together to provide you with the care you need, when you need it.  Your next appointment:   1 year(s)  Provider:   Lovette Rud, PA-C          We recommend signing up for the patient portal called "MyChart".  Sign up information is provided on this After Visit Summary.  MyChart is used to connect with patients for Virtual Visits (Telemedicine).  Patients are able to view lab/test results, encounter notes, upcoming appointments, etc.  Non-urgent messages can be sent to your provider as well.   To learn more about what you can do with MyChart, go to [email or web address].   Thank you for choosing [facility]!!

## 2023-07-08 DIAGNOSIS — M0589 Other rheumatoid arthritis with rheumatoid factor of multiple sites: Secondary | ICD-10-CM | POA: Diagnosis not present

## 2023-07-08 DIAGNOSIS — M79672 Pain in left foot: Secondary | ICD-10-CM | POA: Diagnosis not present

## 2023-07-08 DIAGNOSIS — M79641 Pain in right hand: Secondary | ICD-10-CM | POA: Diagnosis not present

## 2023-07-08 DIAGNOSIS — M79642 Pain in left hand: Secondary | ICD-10-CM | POA: Diagnosis not present

## 2023-07-08 DIAGNOSIS — M79671 Pain in right foot: Secondary | ICD-10-CM | POA: Diagnosis not present

## 2023-07-09 ENCOUNTER — Ambulatory Visit: Payer: Medicare PPO | Admitting: Adult Health

## 2023-07-09 DIAGNOSIS — K5909 Other constipation: Secondary | ICD-10-CM | POA: Diagnosis not present

## 2023-07-09 DIAGNOSIS — M79604 Pain in right leg: Secondary | ICD-10-CM | POA: Diagnosis not present

## 2023-07-09 DIAGNOSIS — E119 Type 2 diabetes mellitus without complications: Secondary | ICD-10-CM | POA: Diagnosis not present

## 2023-07-09 DIAGNOSIS — Z79899 Other long term (current) drug therapy: Secondary | ICD-10-CM | POA: Diagnosis not present

## 2023-07-09 DIAGNOSIS — M069 Rheumatoid arthritis, unspecified: Secondary | ICD-10-CM | POA: Diagnosis not present

## 2023-07-09 DIAGNOSIS — G894 Chronic pain syndrome: Secondary | ICD-10-CM | POA: Diagnosis not present

## 2023-07-09 DIAGNOSIS — E6609 Other obesity due to excess calories: Secondary | ICD-10-CM | POA: Diagnosis not present

## 2023-07-09 DIAGNOSIS — I10 Essential (primary) hypertension: Secondary | ICD-10-CM | POA: Diagnosis not present

## 2023-07-09 DIAGNOSIS — M545 Low back pain, unspecified: Secondary | ICD-10-CM | POA: Diagnosis not present

## 2023-07-18 DIAGNOSIS — J301 Allergic rhinitis due to pollen: Secondary | ICD-10-CM | POA: Diagnosis not present

## 2023-07-29 DIAGNOSIS — J3081 Allergic rhinitis due to animal (cat) (dog) hair and dander: Secondary | ICD-10-CM | POA: Diagnosis not present

## 2023-07-29 DIAGNOSIS — J301 Allergic rhinitis due to pollen: Secondary | ICD-10-CM | POA: Diagnosis not present

## 2023-07-29 DIAGNOSIS — J3089 Other allergic rhinitis: Secondary | ICD-10-CM | POA: Diagnosis not present

## 2023-07-31 ENCOUNTER — Other Ambulatory Visit: Payer: Self-pay | Admitting: Neurology

## 2023-08-05 DIAGNOSIS — E119 Type 2 diabetes mellitus without complications: Secondary | ICD-10-CM | POA: Diagnosis not present

## 2023-08-05 DIAGNOSIS — G894 Chronic pain syndrome: Secondary | ICD-10-CM | POA: Diagnosis not present

## 2023-08-05 DIAGNOSIS — M545 Low back pain, unspecified: Secondary | ICD-10-CM | POA: Diagnosis not present

## 2023-08-05 DIAGNOSIS — M25551 Pain in right hip: Secondary | ICD-10-CM | POA: Diagnosis not present

## 2023-08-05 DIAGNOSIS — M79604 Pain in right leg: Secondary | ICD-10-CM | POA: Diagnosis not present

## 2023-08-05 DIAGNOSIS — Z79899 Other long term (current) drug therapy: Secondary | ICD-10-CM | POA: Diagnosis not present

## 2023-08-05 DIAGNOSIS — I10 Essential (primary) hypertension: Secondary | ICD-10-CM | POA: Diagnosis not present

## 2023-08-05 DIAGNOSIS — M069 Rheumatoid arthritis, unspecified: Secondary | ICD-10-CM | POA: Diagnosis not present

## 2023-08-05 DIAGNOSIS — E6609 Other obesity due to excess calories: Secondary | ICD-10-CM | POA: Diagnosis not present

## 2023-08-05 DIAGNOSIS — M0589 Other rheumatoid arthritis with rheumatoid factor of multiple sites: Secondary | ICD-10-CM | POA: Diagnosis not present

## 2023-08-26 DIAGNOSIS — Z79899 Other long term (current) drug therapy: Secondary | ICD-10-CM | POA: Diagnosis not present

## 2023-08-26 DIAGNOSIS — R600 Localized edema: Secondary | ICD-10-CM | POA: Diagnosis not present

## 2023-08-26 DIAGNOSIS — M0589 Other rheumatoid arthritis with rheumatoid factor of multiple sites: Secondary | ICD-10-CM | POA: Diagnosis not present

## 2023-08-26 DIAGNOSIS — Z6833 Body mass index (BMI) 33.0-33.9, adult: Secondary | ICD-10-CM | POA: Diagnosis not present

## 2023-08-26 DIAGNOSIS — M797 Fibromyalgia: Secondary | ICD-10-CM | POA: Diagnosis not present

## 2023-08-26 DIAGNOSIS — R21 Rash and other nonspecific skin eruption: Secondary | ICD-10-CM | POA: Diagnosis not present

## 2023-08-26 DIAGNOSIS — M1991 Primary osteoarthritis, unspecified site: Secondary | ICD-10-CM | POA: Diagnosis not present

## 2023-08-26 DIAGNOSIS — E669 Obesity, unspecified: Secondary | ICD-10-CM | POA: Diagnosis not present

## 2023-09-02 DIAGNOSIS — M0589 Other rheumatoid arthritis with rheumatoid factor of multiple sites: Secondary | ICD-10-CM | POA: Diagnosis not present

## 2023-09-04 DIAGNOSIS — N3281 Overactive bladder: Secondary | ICD-10-CM | POA: Diagnosis not present

## 2023-09-04 DIAGNOSIS — M069 Rheumatoid arthritis, unspecified: Secondary | ICD-10-CM | POA: Diagnosis not present

## 2023-09-04 DIAGNOSIS — I5032 Chronic diastolic (congestive) heart failure: Secondary | ICD-10-CM | POA: Diagnosis not present

## 2023-09-04 DIAGNOSIS — E7800 Pure hypercholesterolemia, unspecified: Secondary | ICD-10-CM | POA: Diagnosis not present

## 2023-09-04 DIAGNOSIS — I10 Essential (primary) hypertension: Secondary | ICD-10-CM | POA: Diagnosis not present

## 2023-09-04 DIAGNOSIS — R2689 Other abnormalities of gait and mobility: Secondary | ICD-10-CM | POA: Diagnosis not present

## 2023-09-26 ENCOUNTER — Other Ambulatory Visit: Payer: Self-pay | Admitting: Cardiology

## 2023-10-02 ENCOUNTER — Telehealth: Payer: Self-pay | Admitting: Cardiology

## 2023-10-02 NOTE — Telephone Encounter (Signed)
 Pt c/o medication issue:  1. Name of Medication:   meloxicam (MOBIC) 7.5 MG tablet    2. How are you currently taking this medication (dosage and times per day)?   3. Are you having a reaction (difficulty breathing--STAT)?   4. What is your medication issue?   Patient says she was taken off Meloxicam and she would like to know if she can start taking it again.

## 2023-10-02 NOTE — Telephone Encounter (Signed)
 Will forward to the pharmacy team to review

## 2023-10-03 NOTE — Telephone Encounter (Signed)
 Sent My-Chart message

## 2023-10-06 NOTE — Progress Notes (Unsigned)
 PATIENT: [name] DOB: 06-22-1945  REASON FOR VISIT: follow up HISTORY FROM: patient  No chief complaint on file.    HISTORY OF PRESENT ILLNESS: Today 10/06/23:  [name] is a 78 year old female. She returns today for follow-up. Continues on Topamax  50 mg daily. Only had two migraines in the last year. May get vertigo if she lays back to quick. This only happens on occasion. Currently happy with treatment  Reports that 4 months ago she noticed neck stiffness. She reports no known injuries to the neck. She has to sleep on her back. Reports that her balance is a little off- feels like she may fall backwards. Pain in the neck is on both sides but only radiates to the back of the head on the right. She is unsure if its due to her sleep. Not tried  neck exercises. Takes hydrocodone  already for pain. Has not tried anything else for the pain.  05/17/20: [name] is a 78 year old female with a history of vestibular migraines.  She returns today for follow-up.  She is currently on Topamax  50 mg daily.  She reports that this works fairly well for her.  She states over the last year she is only had a couple of vestibular migraines.  She reports that occasionally she will have episodes of vertigo but this is typically quick and occurs when she is laying back in bed.  She denies any new symptoms.  She returns today for an evaluation.  04/27/19: [name] is a 78 year old female with a history of vestibular migraines.  She reports that she has done well on Qudexy .  She reports her insurance no longer covers this medication.  Since she has been taking it she is only had 1 vestibular migraine.  She states that she typically does not have any dizziness unless she has a migraine.  She would like to switch back to Topamax  that she originally started with this medication and it worked well for her.  HISTORY   She continue to have vertigo. Migraines are improved, just one episode. She  continues on the Topamax .  The new dizziness started 4 months ago. She was in PT for her hips and legs, when she laid back she noticed it. When she goes to bed and lay down that is when she feels it. Brief and the room spins, 1 minute, sitting still improves also dizzy in the morning. She is not drinking enough fluids.    HPI:  [name] is a 78 y.o. female here as a referral from Dr. Gwenn for dizziness. Medical history migraines(since  hypertension, hypercholesterolemia, spinal stenosis, diabetes, osteoarthritis and polyarthropathy, trochanteric bursitis, vitamin D  deficiency, glaucoma, rheumatoid arthritis, spondylolisthesis.October 17 she woke up with severe spinning, nausea. Then it happened again January 29th worsening, she can incapacitated with a headache for 3 days. She has headaches at the temples with light sensitivity, nausea, pounding/throbbing, feels like her eyes are going to pop out. She has daily headache. She is taking 6-8 extra strength excedrin every day. Worse in the morning and at night. Headaches wake her up. She has gained 14 pounds since July. Blurry vision and vertigo. Severe, continuous, daily headaches. She has neck pain.No other focal neurologic deficits, associated symptoms, inciting events or modifiable factors.   Reviewed notes, labs and imaging from outside physicians, which showed:   CT head showed No acute intracranial abnormalities including mass lesion or mass effect, hydrocephalus, extra-axial fluid collection, midline shift, hemorrhage,  or acute infarction, large ischemic events (personally reviewed images)    REVIEW OF SYSTEMS: Out of a complete 14 system review of symptoms, the patient complains only of the following symptoms, and all other reviewed systems are negative.   See HPI  ALLERGIES: Allergies  Allergen Reactions   Leflunomide Diarrhea   Simvastatin     Other Reaction(s): elevated CPK, muscle pain   Sulfa Antibiotics Other (See  Comments)    Don't remember     HOME MEDICATIONS: Outpatient Medications Prior to Visit  Medication Sig Dispense Refill   ACCU-CHEK GUIDE test strip as needed.     Accu-Chek Softclix Lancets lancets      acetaminophen  (TYLENOL ) 325 MG tablet Take 2 tablets (650 mg total) by mouth every 6 (six) hours as needed for mild pain (pain score 1-3 or temp > 100.5). 120 tablet 1   cetirizine (ZYRTEC) 10 MG tablet Take 10 mg by mouth daily.      Cholecalciferol  (VITAMIN D ) 50 MCG (2000 UT) tablet Take 2,000 Units by mouth daily.      clopidogrel  (PLAVIX ) 75 MG tablet Take 1 tablet (75 mg total) by mouth daily. 90 tablet 2   docusate sodium  (COLACE) 100 MG capsule Take 1 capsule (100 mg total) by mouth 2 (two) times daily. (Patient taking differently: Take 100 mg by mouth as needed for mild constipation.) 60 capsule 1   DULoxetine  (CYMBALTA ) 30 MG capsule Take 60 mg by mouth daily.      EPIPEN  2-PAK 0.3 MG/0.3ML SOAJ injection Inject 0.3 mg into the muscle daily as needed. For anaphylactic allergic reaction  1   ezetimibe  (ZETIA ) 10 MG tablet Take 10 mg by mouth daily.     fenofibrate  54 MG tablet Take 54 mg by mouth daily.     folic acid  (FOLVITE ) 1 MG tablet Take 1 mg by mouth daily.     furosemide  (LASIX ) 20 MG tablet TAKE 1 TABLET BY MOUTH EVERY DAY 90 tablet 2   gabapentin  (NEURONTIN ) 300 MG capsule Take 300 mg by mouth 3 (three) times daily.     HYDROcodone -acetaminophen  (NORCO) 7.5-325 MG tablet Take 1-4 tablets by mouth every 6 (six) hours as needed for moderate pain.     hydroxychloroquine (PLAQUENIL) 200 MG tablet Take 200 mg by mouth 2 (two) times daily.     latanoprost  (XALATAN ) 0.005 % ophthalmic solution Place 1 drop into both eyes at bedtime.      meloxicam (MOBIC) 7.5 MG tablet Take 1 tablet by mouth daily as needed.     methotrexate  (RHEUMATREX) 2.5 MG tablet Take 10 tablets by mouth once a week.     NON FORMULARY Inject 1 Syringe into the skin 2 (two) times a week. Allergy Shots      rosuvastatin  (CRESTOR ) 5 MG tablet Take 1 tablet (5 mg total) by mouth daily. 90 tablet 2   RYBELSUS 7 MG TABS Take 7 mg by mouth daily.     Tocilizumab  (ACTEMRA ) 200 MG/10ML SOLN      topiramate  (TOPAMAX ) 50 MG tablet TAKE 1 TABLET BY MOUTH EVERYDAY AT BEDTIME 90 tablet 1   No facility-administered medications prior to visit.    PAST MEDICAL HISTORY: Past Medical History:  Diagnosis Date   Chronic fatigue    Depression    Diabetes mellitus without complication (HCC)    Early cataracts, bilateral    Fibromyalgia    Glaucoma    Headache    Vestibular migraines   History of bronchitis    History  of pneumonia    Hypercholesterolemia    RA (rheumatoid arthritis) (HCC)    RA (rheumatoid arthritis) (HCC)    TIA (transient ischemic attack)    Vertigo    Wears glasses     PAST SURGICAL HISTORY: Past Surgical History:  Procedure Laterality Date   APPENDECTOMY     BACK SURGERY  2012, 2017   BILATERAL CARPAL TUNNEL RELEASE     CESAREAN SECTION     x3   DILATION AND CURETTAGE OF UTERUS     GLAUCOMA SURGERY     KNEE ARTHROSCOPY Left    MOUTH SURGERY     TONSILLECTOMY     TOTAL HIP ARTHROPLASTY Right 01/30/2017   Procedure: RIGHT TOTAL HIP ARTHROPLASTY ANTERIOR APPROACH;  Surgeon: Vernetta Lonni GRADE, MD;  Location: [facility];  Service: Orthopedics;  Laterality: Right;   TOTAL HIP REVISION Right 07/28/2019   Procedure: TOTAL HIP REVISION, posterior approach femoal comp;  Surgeon: Fidel Rogue, MD;  Location: [facility];  Service: Orthopedics;  Laterality: Right;    FAMILY HISTORY: Family History  Problem Relation Age of Onset   Diabetes Mother    Stroke Mother    Heart disease Mother    High blood pressure Father    Stroke Father    Diabetes Father    Heart disease Father    Heart disease Sister    Migraines Neg Hx     SOCIAL HISTORY: Social History   Socioeconomic History   Marital status: Widowed    Spouse name: Not on file   Number of children: 4   Years of  education: 16   Highest education level: Not on file  Occupational History   Occupation: Retired    Comment: Runner, broadcasting/film/video  Tobacco Use   Smoking status: Never   Smokeless tobacco: Never  Vaping Use   Vaping status: Never Used  Substance and Sexual Activity   Alcohol use: No   Drug use: No   Sexual activity: Not on file  Other Topics Concern   Not on file  Social History Narrative   Lives at home alone   Right-handed   Caffeine: tea daily   Has 10 grandchildren   Social Drivers of Corporate investment banker Strain: Not on file  Food Insecurity: Not on file  Transportation Needs: Not on file  Physical Activity: Not on file  Stress: Not on file  Social Connections: Unknown (04/30/2022)   Received from [facility]   Social Network    Social Network: Not on file  Intimate Partner Violence: Unknown (04/30/2022)   Received from [facility]   HITS    Physically Hurt: Not on file    Insult or Talk Down To: Not on file    Threaten Physical Harm: Not on file    Scream or Curse: Not on file      PHYSICAL EXAM  There were no vitals filed for this visit.  There is no height or weight on file to calculate BMI.  Generalized: Well developed, in no acute distress   Neurological examination  Mentation: Alert oriented to time, place, history taking. Follows all commands speech and language fluent Cranial nerve II-XII: Pupils were equal round reactive to light. Extraocular movements were full, visual field were full on confrontational test. Uvula tongue midline. Head turning and shoulder shrug  were normal and symmetric. Motor: The motor testing reveals 5 over 5 strength of all 4 extremities. Rounded posture. Trapezius muscles tight. Good symmetric motor tone is noted throughout.  Sensory: Sensory testing is intact to soft touch on all 4 extremities. No evidence of extinction is noted.  Coordination: Cerebellar testing reveals good finger-nose-finger and heel-to-shin bilaterally.   Gait and station: Uses a cane when ambulating Reflexes: Deep tendon reflexes are symmetric and normal bilaterally.   DIAGNOSTIC DATA (LABS, IMAGING, TESTING) - I reviewed patient records, labs, notes, testing and imaging myself where available.  Lab Results  Component Value Date   WBC 6.3 10/10/2021   HGB 14.1 10/10/2021   HCT 43.1 10/10/2021   MCV 99 (H) 10/10/2021   PLT 339 10/10/2021      Component Value Date/Time   NA 137 02/27/2022 1036   K 4.3 02/27/2022 1036   CL 99 02/27/2022 1036   CO2 25 02/27/2022 1036   GLUCOSE 115 (H) 02/27/2022 1036   GLUCOSE 165 (H) 09/14/2019 1752   BUN 24 02/27/2022 1036   CREATININE 1.04 (H) 02/27/2022 1036   CREATININE 0.84 09/08/2019 1323   CALCIUM  10.1 02/27/2022 1036   PROT 7.3 11/22/2022 0828   ALBUMIN  4.6 11/22/2022 0828   AST 17 11/22/2022 0828   ALT 16 11/22/2022 0828   ALKPHOS 119 11/22/2022 0828   BILITOT 0.6 11/22/2022 0828   GFRNONAA >60 09/14/2019 1752   GFRAA >60 09/14/2019 1752   Lab Results  Component Value Date   CHOL 146 11/22/2022   HDL 57 11/22/2022   LDLCALC 62 11/22/2022   TRIG 158 (H) 11/22/2022   CHOLHDL 2.6 11/22/2022   Lab Results  Component Value Date   HGBA1C 6.7 (H) 07/20/2019      ASSESSMENT AND PLAN 78 y.o. year old female  has a past medical history of Chronic fatigue, Depression, Diabetes mellitus without complication (HCC), Early cataracts, bilateral, Fibromyalgia, Glaucoma, Headache, History of bronchitis, History of pneumonia, Hypercholesterolemia, RA (rheumatoid arthritis) (HCC), RA (rheumatoid arthritis) (HCC), TIA (transient ischemic attack), Vertigo, and Wears glasses. here with:  1.  Vestibular migraine  -Continue Topamax  50 mg at bedtime -Advised if headache frequency or dizziness increases she should let us  know  2. Neck pain   - referral to PT - if this is not helpful may consider imaging or muscle relaxer   -Follow-up in 6 month or sooner if needed    Duwaine Russell,  MSN, NP-C 10/06/2023, 9:36 AM [facility] [address] [place], [place] [address] [phone]

## 2023-10-07 ENCOUNTER — Telehealth: Payer: Self-pay | Admitting: Cardiology

## 2023-10-07 ENCOUNTER — Ambulatory Visit: Admitting: Adult Health

## 2023-10-07 ENCOUNTER — Encounter: Payer: Self-pay | Admitting: Adult Health

## 2023-10-07 VITALS — BP 137/59 | HR 63 | Ht 59.0 in | Wt 201.0 lb

## 2023-10-07 DIAGNOSIS — G43809 Other migraine, not intractable, without status migrainosus: Secondary | ICD-10-CM | POA: Diagnosis not present

## 2023-10-07 NOTE — Patient Instructions (Addendum)
 Your Plan:  Continue Topamax  50 mg at bedtime If your pcp is amendable to managing Topamax  then you can follow-up with us  as needed. If your symptoms worsen or you develop new symptoms please let us  know.    Thank you for coming to see us  at [facility]. I hope we have been able to provide you high quality care today.  You may receive a patient satisfaction survey over the next few weeks. We would appreciate your feedback and comments so that we may continue to improve ourselves and the health of our patients.

## 2023-10-07 NOTE — Telephone Encounter (Signed)
 Patient states since Wednesday last week (10/01/23) she has had swelling all over, her hands are stiff she cannot make a fist, her legs and ankles are swollen.  She has been taking double her Lasix  (40 mg daily) since 8/27. She states she has not been urinating any more than usual since taking this increased dose.  She reports she does eat quite a bit of sodium. She drinks about 48 oz of water  daily.  Reports her weight has increased by 4 lbs since 8/27. Denies any increased SOB. She notes she does have rheumatoid arthritis and fibromyalgia as well.  Will forward to Dr. Jeffrie to review and advise.  Encouraged patient to limit salt intake and continue to stay hydrated. Patient verbalized understanding.

## 2023-10-07 NOTE — Telephone Encounter (Signed)
 Pt c/o swelling/edema: STAT if pt has developed SOB within 24 hours  If swelling, where is the swelling located?  All over her body - Hands, patient stated she can't make a fist and legs  How much weight have you gained and in what time span?   4 pounds since Friday  Have you gained 2 pounds in a day or 5 pounds in a week?   Yes  Do you have a log of your daily weights (if so, list)?   No  Are you currently taking a fluid pill?   Yes  Are you currently SOB?   Same as usual  Have you traveled recently in a car or plane for an extended period of time?   Patient stated she has doubled up on her fluid pill but has not been urinating more than usual.  Patient stated she was weighted at her Pain Management visit and again today and there is a 4 pound weight difference.

## 2023-10-08 MED ORDER — SPIRONOLACTONE 25 MG PO TABS: 25.0000 mg | ORAL_TABLET | Freq: Every day | ORAL | 3 refills | Status: DC

## 2023-10-08 NOTE — Telephone Encounter (Signed)
 Left detailed message for patient with Dr. Jeffrie' recommendation: add back spironolactone  25 mg daily.  Rx sent to [facility].  Provided office number for callback.

## 2023-10-10 NOTE — Telephone Encounter (Signed)
 Pt has not called back with any questions or concerns related to change in medication.  Will close this encounter for now and await a call back if any concerns.

## 2023-10-13 ENCOUNTER — Telehealth: Payer: Self-pay | Admitting: Cardiology

## 2023-10-13 NOTE — Telephone Encounter (Signed)
 I was able to call the patient and I gave ER precautions. She is stating she is having shortness of breath, 5 pound weight gain in a 24 hour period, bilateral swelling of her legs and feet and is taking her prescribed Lasix . I advised that she go to the emergency room to get evaluated and for them to help get that extra fluid off. The patient stated she did not want to go and got emotional about it. She stated the wait times are ridiculous and she will not be going to the ER. She wants to be seen in office. I told her that even if she got seen by us , we would send her to the emergency room due to her shortness of breath. Patient was convinced to go to [facility] and stated she would go and keep us  updated.

## 2023-10-13 NOTE — Telephone Encounter (Signed)
 Pt c/o swelling/edema: STAT if pt has developed SOB within 24 hours  If swelling, where is the swelling located? Hands, legs, abdomen   How much weight have you gained and in what time span? 5 pounds in 1 day  Have you gained 2 pounds in a day or 5 pounds in a week? Yes  Do you have a log of your daily weights (if so, list)? No  Are you currently taking a fluid pill? Yes  Are you currently SOB? Yes   Have you traveled recently in a car or plane for an extended period of time? No

## 2023-10-20 ENCOUNTER — Telehealth: Payer: Self-pay | Admitting: Cardiology

## 2023-10-20 NOTE — Telephone Encounter (Signed)
 Pt c/o medication issue:  1. Name of Medication: spironolactone  (ALDACTONE ) 25 MG tablet   2. How are you currently taking this medication (dosage and times per day)? Take 1 tablet (25 mg total) by mouth daily.   3. Are you having a reaction (difficulty breathing--STAT)? Pt is having severe itching   4. What is your medication issue? Severe itching

## 2023-10-20 NOTE — Telephone Encounter (Signed)
 Recently started spironolactone - has caused her Itching, whole entire body. (Spironolactone  is the only new medication that she has started recently).   No redness or rash; just entire body itching; she feels like she wants to jump off of a cliff it is so bad. She did not take it today due to itching all weekend too. She reports that her legs feel like they are on fire too.  She wanted an appt soon to discuss what else she could take to help with the swelling, since she has to stop taking this medication. She also needs surgical clearance. Made an appt with APP for 10/22/23.  Informed her that I would send this information to the pharmacy and her provider for suggestions on if there is anything she can take to help with this itching, etc... She verbalized understanding.

## 2023-10-20 NOTE — Telephone Encounter (Signed)
 Of course stop the spironolactone .   Try over-the-counter Zyrtec over the next week.   Thank you for the update   Oneil Parchment, MD  Spoke with pt - she is aware of the above information.  She reports she already takes zyrtec daily due to allergies.  Advised to try OTC Pepcid  (famotidine ) as well.  She reports her legs and feet are burning as well.  This has been going on for several weeks now.  Advised to make sure she is drinking plently of water , avoiding sodium and moisturizing her skin.     She will f/u on Wednesday as scheduled and call back prior if needed.

## 2023-10-21 NOTE — Progress Notes (Unsigned)
 Cardiology Office Note:  .   Date:  10/22/2023  ID:  Rock Channing Kaplan, DOB 1945-02-14, MRN [id number] PCP: Dayna Motto, DO  [facility] Providers Cardiologist:  Oneil Parchment, MD {  History of Present Illness: .   [name] is a 78 y.o. 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     Coronary artery calcifications CCTA 08/2021 CAC score of 1133.  Moderate 50 to 69% plaque in proximal to mid LAD.  Insignificant by FFR.  Other Had diarrhea with Jardiance  Reports Lasix  makes her feel odd  Social history  Limited mobility given 2 prior hip surgeries and knee issues Retired eighth grade teacher      Patient with history of nonobstructive disease on coronary CTA, negative by FFR.  She has been asymptomatic.  She has had chronic HFpEF with chronic complaints of shortness of breath and was last seen 06/2023 reporting shortness of breath despite up titration of her Lasix  to 40 mg x 7 days, previously taking 20.  Also reported episodes of dizziness while brushing her teeth or combing her hair.  Reported episodes of falling backwards.  Spironolactone  was discontinued due to dizziness and balance issues.    Patient called office reporting since 8/27 that she has been having diffuse swelling and in her hands and her legs.  She was instructed to double her dose of Lasix  which she was previously taking 40 mg daily but reported decreased urination despite increase of dose.  Reported 4 pound weight gain.  She was encouraged to go to the ED but declined.  Today patient presents for follow-up.  She has been reporting that for the past week or so that she has been having diffuse swelling in her upper and lower extremities but worsening in her lower extremities.  Says legs are painful and feel like they are on fire.  Pain predominantly around her ankles not in the calf.  She reports  weight gain of approximately 15 pounds.  [name] accompanying shortness of breath orthopnea.  Chronically has some degree of shortness of breath but this is not worse than before.  She tried taking different doses of her Lasix .  Normally prescribed 20 mg BID but had talked to family friend who was a cardiac nurse and had recommended higher dose so she was taking sometimes 20 mg several times a day, 40 mg, and even 80 mg without any increased urinary response to uptitration of Lasix .  She continues to report some mild complaints of dizziness and balance issues but these are chronic for her despite stopping the spironolactone .  Has not had any falls.  Also has multiple other nonspecific pain related complaints in her neck, arms, she hit the vacuum with her arm.  She is mildly functional right now and walking with a cane but had extended rooming time due to difficulties with mobility.   ROS: Denies: Chest pain, shortness of breath, orthopnea, palpitations, decreased exercise intolerance, fatigue, lightheadedness.   Studies Reviewed: SABRA    EKG Interpretation Date/Time:  Wednesday October 22 2023 14:23:55 EDT Ventricular Rate:  57 PR Interval:  196 QRS Duration:  108 QT Interval:  446 QTC Calculation: 434 R Axis:   -37  Text Interpretation: Sinus bradycardia Left axis deviation Minimal voltage criteria for LVH, may be normal variant ( Cornell product ) [name] changes Confirmed by Darryle Currier [phone]) on 10/22/2023 2:25:21 PM    Risk Assessment/Calculations:  Physical Exam:   VS:  BP 128/80   Pulse 68   Ht 4' 11 (1.499 m)   Wt 197 lb (89.4 kg)   SpO2 97%   BMI 39.79 kg/m    Wt Readings from Last 3 Encounters:  10/22/23 197 lb (89.4 kg)  10/07/23 201 lb (91.2 kg)  07/04/23 185 lb (83.9 kg)    GEN: Well nourished, well developed in [name] acute distress NECK: Difficult to assess neck. CARDIAC: RRR, [name] murmurs, rubs, gallops RESPIRATORY:  Clear to auscultation without rales, wheezing or  rhonchi  ABDOMEN: Soft, non-tender, non-distended EXTREMITIES:  1+ edema; very tight legs.  [name] deformity   ASSESSMENT AND PLAN: .    Acute HFpEF Since May patient has gained approximately 12 pounds with progressive issues of worsening peripheral edema not responsive to uptitration of Lasix  up to 80 mg.  Fortunately without any orthopnea or shortness of breath.  May be dependent edema but she is physically unable to raise her legs. Will check echocardiogram Start torsemide  40 mg daily as she is exhibiting signs of diuretic resistance.  She had [name] improvement in dizziness/balance issues that were mild after stopping her spironolactone .  She would like to continue this.  Get BMP today and again after 1 week. Reported diarrhea with Jardiance . Encouraged leg compression stockings if able to get on. I think most of this is dependent edema but did provide ER precautions should she start having pulmonary symptoms.  Preoperative evaluation right hip cable removal surgery  Currently treating with HFpEF exacerbation so would wait until she is optimized from a volume standpoint before continuing.  Will also wait for echocardiogram.  Nonobstructive CAD  Hyperlipidemia -CCTA 08/2021 CAC score of 1133.  Moderate 50 to 69% plaque in proximal to mid LAD.  Insignificant by FFR.  Completely asymptomatic. She is chronically on Plavix  for history of TIA.  Continue rosuvastatin  5 mg.  LDL is well-controlled, 41 08/2023.  Continue Zetia  as well.  Dispo: Follow-up in 1 month to further assess diuretic needs and volume status.  We will address preop eval next appointment  Signed, Thom LITTIE Sluder, PA-C

## 2023-10-22 ENCOUNTER — Other Ambulatory Visit: Payer: Self-pay | Admitting: Cardiology

## 2023-10-22 ENCOUNTER — Ambulatory Visit: Attending: Physician Assistant | Admitting: Cardiology

## 2023-10-22 ENCOUNTER — Other Ambulatory Visit: Payer: Self-pay

## 2023-10-22 VITALS — BP 128/80 | HR 68 | Ht 59.0 in | Wt 197.0 lb

## 2023-10-22 DIAGNOSIS — I2510 Atherosclerotic heart disease of native coronary artery without angina pectoris: Secondary | ICD-10-CM

## 2023-10-22 DIAGNOSIS — Z01810 Encounter for preprocedural cardiovascular examination: Secondary | ICD-10-CM

## 2023-10-22 DIAGNOSIS — I5032 Chronic diastolic (congestive) heart failure: Secondary | ICD-10-CM | POA: Diagnosis not present

## 2023-10-22 DIAGNOSIS — E785 Hyperlipidemia, unspecified: Secondary | ICD-10-CM | POA: Diagnosis not present

## 2023-10-22 MED ORDER — SPIRONOLACTONE 25 MG PO TABS: 25.0000 mg | ORAL_TABLET | Freq: Every day | ORAL | 1 refills | Status: AC

## 2023-10-22 MED ORDER — TORSEMIDE 40 MG PO TABS: 1.0000 | ORAL_TABLET | Freq: Every day | ORAL | 1 refills | Status: DC

## 2023-10-22 NOTE — Patient Instructions (Addendum)
 Medication Instructions:  RESTART Spironolactone  25mg  Take 1 tablet once a day  START Torsemide  40mg  Take 1 tablet once a day STOP Lasix  *If you need a refill on your cardiac medications before your next appointment, please call your pharmacy*  Lab Work: BMET TODAY REPEAT BMET IN 1 WEEK (10/29/2023) If you have labs (blood work) drawn today and your tests are completely normal, you will receive your results only by: MyChart Message (if you have MyChart) OR A paper copy in the mail If you have any lab test that is abnormal or we need to change your treatment, we will call you to review the results.  Testing/Procedures: Your physician has requested that you have an echocardiogram. Echocardiography is a painless test that uses sound waves to create images of your heart. It provides your doctor with information about the size and shape of your heart and how well your heart's chambers and valves are working. This procedure takes approximately one hour. There are no restrictions for this procedure. Please do NOT wear cologne, perfume, aftershave, or lotions (deodorant is allowed). Please arrive 15 minutes prior to your appointment time.  Please note: We ask at that you not bring children with you during ultrasound (echo/ vascular) testing. Due to room size and safety concerns, children are not allowed in the ultrasound rooms during exams. Our front office staff cannot provide observation of children in our lobby area while testing is being conducted. An adult accompanying a patient to their appointment will only be allowed in the ultrasound room at the discretion of the ultrasound technician under special circumstances. We apologize for any inconvenience.  Follow-Up: At [facility], you and your health needs are our priority.  As part of our continuing mission to provide you with exceptional heart care, our providers are all part of one team.  This team includes your primary Cardiologist  (physician) and Advanced Practice Providers or APPs (Physician Assistants and Nurse Practitioners) who all work together to provide you with the care you need, when you need it.  Your next appointment:   1 month(s)  Provider:   Thom Sluder, PA    We recommend signing up for the patient portal called MyChart.  Sign up information is provided on this After Visit Summary.  MyChart is used to connect with patients for Virtual Visits (Telemedicine).  Patients are able to view lab/test results, encounter notes, upcoming appointments, etc.  Non-urgent messages can be sent to your provider as well.   To learn more about what you can do with MyChart, go to [email or web address].   Other Instructions Please get some ted hose or compression stockings. They can be purchased at your local medical supply store, [employer], [employer] or [employer] fund[employer].  Put them on first thing in the morning and wear them during the day . Elevate your feet during the day and remove hose in the evening before bed.  Elevate your legs when you can

## 2023-10-22 NOTE — Telephone Encounter (Signed)
 Pt's pharmacy is stating that pt's medication torsemide  needs a PA. Pharmacy comment: Alternative Requested:PA NEEDED.

## 2023-10-23 ENCOUNTER — Ambulatory Visit: Payer: Self-pay | Admitting: Cardiology

## 2023-10-23 ENCOUNTER — Telehealth: Payer: Self-pay | Admitting: Cardiology

## 2023-10-23 ENCOUNTER — Telehealth: Payer: Self-pay | Admitting: Pharmacy Technician

## 2023-10-23 ENCOUNTER — Other Ambulatory Visit (HOSPITAL_COMMUNITY): Payer: Self-pay

## 2023-10-23 LAB — BASIC METABOLIC PANEL WITH GFR
BUN/Creatinine Ratio: 19 (ref 12–28)
BUN: 29 mg/dL — ABNORMAL HIGH (ref 8–27)
CO2: 25 mmol/L (ref 20–29)
Calcium: 11 mg/dL — ABNORMAL HIGH (ref 8.7–10.3)
Chloride: 98 mmol/L (ref 96–106)
Creatinine, Ser: 1.49 mg/dL — ABNORMAL HIGH (ref 0.57–1.00)
Glucose: 110 mg/dL — ABNORMAL HIGH (ref 70–99)
Potassium: 3.9 mmol/L (ref 3.5–5.2)
Sodium: 139 mmol/L (ref 134–144)
eGFR: 36 mL/min/1.73 — ABNORMAL LOW (ref >59)

## 2023-10-23 MED ORDER — TORSEMIDE 20 MG PO TABS: 40.0000 mg | ORAL_TABLET | Freq: Every day | ORAL | 1 refills | Status: DC

## 2023-10-23 NOTE — Telephone Encounter (Signed)
 Spoke with pharmacist at [facility].  Torsemide  does not come in 40 mg tablets.  I let pharmacy know it would be OK to fill with 2 of the 20 mg torsemide  tablets daily.  Pharmacy aware lasix  was stopped.

## 2023-10-23 NOTE — Telephone Encounter (Signed)
 Pt c/o medication issue:  1. Name of Medication: Torsemide  40 MG TABS   2. How are you currently taking this medication (dosage and times per day)? Take 1 tablet by mouth daily.   3. Are you having a reaction (difficulty breathing--STAT)? No  4. What is your medication issue? Pharmacy calling to state Torsemide  only comes in 20mg  tabs. Pharmacist requesting permission to fill script for 20 mg twice a day.

## 2023-10-29 ENCOUNTER — Other Ambulatory Visit: Payer: Self-pay

## 2023-10-29 MED ORDER — CLOPIDOGREL BISULFATE 75 MG PO TABS: 75.0000 mg | ORAL_TABLET | Freq: Every day | ORAL | 3 refills | Status: AC

## 2023-10-29 MED ORDER — ROSUVASTATIN CALCIUM 5 MG PO TABS: 5.0000 mg | ORAL_TABLET | Freq: Every day | ORAL | 3 refills | Status: AC

## 2023-11-14 ENCOUNTER — Other Ambulatory Visit: Payer: Self-pay

## 2023-11-17 MED ORDER — TORSEMIDE 20 MG PO TABS: 40.0000 mg | ORAL_TABLET | Freq: Every day | ORAL | 11 refills | Status: AC

## 2023-11-19 NOTE — Progress Notes (Unsigned)
 Cardiology Office Note:    Date:  11/19/2023   ID:  [name], DOB 04-09-1945, MRN [id number]  PCP:  Dayna Motto, DO   [facility] Providers Cardiologist:  Oneil Parchment, MD { Click to update primary MD,subspecialty MD or APP then REFRESH:1}    Referring MD: Dayna Motto, DO   No chief complaint on file. ***  History of Present Illness:    [name] is a 78 y.o. female with a hx of ***  Past Medical History:  Diagnosis Date   Chronic fatigue    Depression    Diabetes mellitus without complication (HCC)    Early cataracts, bilateral    Fibromyalgia    Glaucoma    Headache    Vestibular migraines   History of bronchitis    History of pneumonia    Hypercholesterolemia    RA (rheumatoid arthritis) (HCC)    RA (rheumatoid arthritis) (HCC)    TIA (transient ischemic attack)    Vertigo    Wears glasses     Past Surgical History:  Procedure Laterality Date   APPENDECTOMY     BACK SURGERY  2012, 2017   BILATERAL CARPAL TUNNEL RELEASE     CESAREAN SECTION     x3   DILATION AND CURETTAGE OF UTERUS     GLAUCOMA SURGERY     KNEE ARTHROSCOPY Left    MOUTH SURGERY     TONSILLECTOMY     TOTAL HIP ARTHROPLASTY Right 01/30/2017   Procedure: RIGHT TOTAL HIP ARTHROPLASTY ANTERIOR APPROACH;  Surgeon: Vernetta Lonni GRADE, MD;  Location: [facility];  Service: Orthopedics;  Laterality: Right;   TOTAL HIP REVISION Right 07/28/2019   Procedure: TOTAL HIP REVISION, posterior approach femoal comp;  Surgeon: Fidel Rogue, MD;  Location: [facility] OR;  Service: Orthopedics;  Laterality: Right;    Current Medications: No outpatient medications have been marked as taking for the 11/20/23 encounter (Appointment) with Madie Jon Garre, PA.     Allergies:   Leflunomide, Simvastatin, and Sulfa antibiotics   Social History   Socioeconomic History   Marital status: Widowed    Spouse name: Not on file   Number of children: 4   Years of education: 16    Highest education level: Not on file  Occupational History   Occupation: Retired    Comment: Runner, broadcasting/film/video  Tobacco Use   Smoking status: Never   Smokeless tobacco: Never  Vaping Use   Vaping status: Never Used  Substance and Sexual Activity   Alcohol use: No   Drug use: No   Sexual activity: Not on file  Other Topics Concern   Not on file  Social History Narrative   Lives at home alone   Right-handed   Caffeine: tea daily   Has 10 grandchildren   Social Drivers of Corporate investment banker Strain: Not on file  Food Insecurity: Not on file  Transportation Needs: Not on file  Physical Activity: Not on file  Stress: Not on file  Social Connections: Unknown (04/30/2022)   Received from [employer]   Social Network    Social Network: Not on file     Family History: The patient's ***family history includes Diabetes in her father and mother; Heart disease in her father, mother, and sister; High blood pressure in her father; Stroke in her father and mother. There is no history of Migraines.  ROS:   Please see the history of present illness.    *** All other systems reviewed and  are negative.  EKGs/Labs/Other Studies Reviewed:    The following studies were reviewed today: ***      Recent Labs: 11/22/2022: ALT 16 10/22/2023: BUN 29; Creatinine, Ser 1.49; Potassium 3.9; Sodium 139  Recent Lipid Panel    Component Value Date/Time   CHOL 146 11/22/2022 0828   TRIG 158 (H) 11/22/2022 0828   HDL 57 11/22/2022 0828   CHOLHDL 2.6 11/22/2022 0828   CHOLHDL 6.6 02/25/2007 0520   VLDL 29 02/25/2007 0520   LDLCALC 62 11/22/2022 0828     Risk Assessment/Calculations:   {Does this patient have ATRIAL FIBRILLATION?:[id number]}  No BP recorded.  {Refresh Note OR Click here to enter BP  :1}***         Physical Exam:    VS:  There were no vitals taken for this visit.    Wt Readings from Last 3 Encounters:  10/22/23 197 lb (89.4 kg)  10/07/23 201 lb (91.2 kg)  07/04/23  185 lb (83.9 kg)     GEN: *** Well nourished, well developed in no acute distress HEENT: Normal NECK: No JVD; No carotid bruits LYMPHATICS: No lymphadenopathy CARDIAC: ***RRR, no murmurs, rubs, gallops RESPIRATORY:  Clear to auscultation without rales, wheezing or rhonchi  ABDOMEN: Soft, non-tender, non-distended MUSCULOSKELETAL:  No edema; No deformity  SKIN: Warm and dry NEUROLOGIC:  Alert and oriented x 3 PSYCHIATRIC:  Normal affect   ASSESSMENT:    No diagnosis found. PLAN:    In order of problems listed above:  ***      {Are you ordering a CV Procedure (e.g. stress test, cath, DCCV, TEE, etc)?   Press F2        :[id number]}    Medication Adjustments/Labs and Tests Ordered: Current medicines are reviewed at length with the patient today.  Concerns regarding medicines are outlined above.  No orders of the defined types were placed in this encounter.  No orders of the defined types were placed in this encounter.   There are no Patient Instructions on file for this visit.   Signed, Jon Nat Hails, PA  11/19/2023 9:26 AM    [facility]

## 2023-11-19 NOTE — Progress Notes (Unsigned)
 Cardiology Office Note:  .   Date:  11/20/2023  ID:  [name], DOB July 13, 1945, MRN [id number] PCP: Dayna Motto, DO  [facility] Providers Cardiologist:  Oneil Parchment, MD {  History of Present Illness: .   [name] is a 78 y.o. 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      Coronary artery calcifications CCTA 08/2021 CAC score of 1133.  Moderate 50 to 69% plaque in proximal to mid LAD.  Insignificant by FFR.   Other Had diarrhea with Jardiance  Reports Lasix  makes her feel odd   Social history  Limited mobility given 2 prior hip surgeries and knee issues.  Ambulates with a cane Retired eighth grade teacher     Patient with history of nonobstructive disease on coronary CTA, negative by FFR.  She has been asymptomatic.  She has had chronic HFpEF with chronic complaints of shortness of breath since 06/2023 reporting shortness of breath despite up titration of her Lasix  to 40 mg x 7 days, previously taking 20.  Also reported episodes of dizziness while brushing her teeth or combing her hair.  Reported episodes of falling backwards.  Spironolactone  was discontinued due to dizziness and balance issues.  She was seen 10/2023 by myself and had been reporting diffuse swelling in her upper and lower extremities despite doubling her dose of Lasix .  She had reported 15 pound weight gain without accompanied with shortness of breath/orthopnea.  She was showing signs of diuretic resistance.  I started her on torsemide  40 mg daily and got echocardiogram which is still pending.  Also restarted her spiro as he symptoms did not improve after holding this. I had repeated BMP and creatinine had increased to 1.49, I had asked her to repeat but this was not completed.  She was tentatively scheduled for right hip cable removal surgery so this was delayed.  Today patient presents  for follow-up.  She reports she is very happy with her leg swelling and her weight has decreased about 6 pounds from prior office visit.  Feels that her weight and swelling is back at baseline.  She reports no significant shortness of breath, orthopnea, chest pain.  She reports no exertional symptoms however largely inactive given her musculoskeletal related issues and rheumatoid arthritis limited by pain.  Otherwise she has no other acute concerns today and overall happy with current regimen.  ROS: Denies: Chest pain, shortness of breath, orthopnea, peripheral edema, palpitations, decreased exercise intolerance, fatigue, lightheadedness.   Studies Reviewed: .         Risk Assessment/Calculations:             Physical Exam:   VS:  BP 132/60   Pulse 93   Ht 4' 11 (1.499 m)   Wt 191 lb 3.2 oz (86.7 kg)   SpO2 95%   BMI 38.62 kg/m    Wt Readings from Last 5 Encounters:  11/20/23 191 lb 3.2 oz (86.7 kg)  10/22/23 197 lb (89.4 kg)  10/07/23 201 lb (91.2 kg)  07/04/23 185 lb (83.9 kg)  05/27/22 186 lb (84.4 kg)    GEN: Well nourished, well developed in no acute distress NECK: No JVD; No carotid bruits CARDIAC: RRR, no murmurs, rubs, gallops RESPIRATORY:  Clear to auscultation without rales, wheezing or rhonchi  ABDOMEN: Soft, non-tender, non-distended EXTREMITIES:  No edema; No deformity   ASSESSMENT AND PLAN: .    Chronic HFpEF Patient  recently reporting weight gain and showing signs of diuretic resistance on lasix  without any accompanied symptoms of shortness of breath or signs of pulmonary congestion on exam.  I started torsemide  40 mg and she has had great response with this with 6 pound weight loss which today she is 191 pounds which seems to be approximately around her baseline.   She has echocardiogram pending for 10/22.   Repeat BMP today.  She did have elevation in creatinine. It is not clear whether yet if it was related to renal congestion, sufficient diuresis or  progressive CKD.  After this results we will adjust diuretic dosage if needed. Continue torsemide  40 mg daily and spironolactone  25 mg.  If EF is reduced we will titrate GDMT more aggressively.   Reported diarrhea with Jardiance .   Preoperative evaluation right hip cable removal surgery  Will wait for echocardiogram before weighing in on preoperative evaluation.  Of note at baseline she does have poor functional status but is limited based off of pain and her rheumatoid arthritis/musculoskeletal related issues rather than symptoms.   Nonobstructive CAD  Hyperlipidemia -CCTA 08/2021 CAC score of 1133.  Moderate 50 to 69% plaque in proximal to mid LAD.  Insignificant by FFR.  Completely asymptomatic. She is chronically on Plavix  for history of TIA.  Continue rosuvastatin  5 mg.  LDL is well-controlled, 41 08/2023.  Continue Zetia  as well.   Dispo: Follow-up in 3 months with Dr. Jeffrie or myself to ensure stable diuretic regimen and follow-up on echocardiogram.  Signed, Thom LITTIE Sluder, PA-C

## 2023-11-20 ENCOUNTER — Ambulatory Visit: Attending: Physician Assistant | Admitting: Cardiology

## 2023-11-20 ENCOUNTER — Encounter: Payer: Self-pay | Admitting: Physician Assistant

## 2023-11-20 VITALS — BP 132/60 | HR 93 | Ht 59.0 in | Wt 191.2 lb

## 2023-11-20 DIAGNOSIS — E785 Hyperlipidemia, unspecified: Secondary | ICD-10-CM | POA: Diagnosis not present

## 2023-11-20 DIAGNOSIS — I2510 Atherosclerotic heart disease of native coronary artery without angina pectoris: Secondary | ICD-10-CM | POA: Diagnosis not present

## 2023-11-20 DIAGNOSIS — I5032 Chronic diastolic (congestive) heart failure: Secondary | ICD-10-CM | POA: Diagnosis not present

## 2023-11-20 DIAGNOSIS — Z01810 Encounter for preprocedural cardiovascular examination: Secondary | ICD-10-CM

## 2023-11-20 NOTE — Patient Instructions (Signed)
 Medication Instructions:  Your physician recommends that you continue on your current medications as directed. Please refer to the Current Medication list given to you today.  *If you need a refill on your cardiac medications before your next appointment, please call your pharmacy*  Lab Work: Today- BMET If you have labs (blood work) drawn today and your tests are completely normal, you will receive your results only by: MyChart Message (if you have MyChart) OR A paper copy in the mail If you have any lab test that is abnormal or we need to change your treatment, we will call you to review the results.    Follow-Up: At [facility], you and your health needs are our priority.  As part of our continuing mission to provide you with exceptional heart care, our providers are all part of one team.  This team includes your primary Cardiologist (physician) and Advanced Practice Providers or APPs (Physician Assistants and Nurse Practitioners) who all work together to provide you with the care you need, when you need it.  Your next appointment:   3 month(s)  Provider:   Oneil Parchment, MD or Thom Sluder, PA-C

## 2023-11-21 ENCOUNTER — Ambulatory Visit: Payer: Self-pay | Admitting: Cardiology

## 2023-11-21 LAB — BASIC METABOLIC PANEL WITH GFR
BUN/Creatinine Ratio: 18 (ref 12–28)
BUN: 24 mg/dL (ref 8–27)
CO2: 23 mmol/L (ref 20–29)
Calcium: 10.6 mg/dL — ABNORMAL HIGH (ref 8.7–10.3)
Chloride: 101 mmol/L (ref 96–106)
Creatinine, Ser: 1.34 mg/dL — ABNORMAL HIGH (ref 0.57–1.00)
Glucose: 176 mg/dL — ABNORMAL HIGH (ref 70–99)
Potassium: 4 mmol/L (ref 3.5–5.2)
Sodium: 140 mmol/L (ref 134–144)
eGFR: 41 mL/min/1.73 — ABNORMAL LOW (ref >59)

## 2023-11-26 ENCOUNTER — Ambulatory Visit (HOSPITAL_COMMUNITY)
Admission: RE | Admit: 2023-11-26 | Discharge: 2023-11-26 | Disposition: A | Discharge status: Home / Self Care | Source: Ambulatory Visit | Attending: Cardiology | Admitting: Cardiology

## 2023-11-26 DIAGNOSIS — I5032 Chronic diastolic (congestive) heart failure: Secondary | ICD-10-CM | POA: Insufficient documentation

## 2023-11-26 DIAGNOSIS — E785 Hyperlipidemia, unspecified: Secondary | ICD-10-CM | POA: Diagnosis present

## 2023-11-26 DIAGNOSIS — I358 Other nonrheumatic aortic valve disorders: Secondary | ICD-10-CM | POA: Diagnosis not present

## 2023-11-26 DIAGNOSIS — Z01810 Encounter for preprocedural cardiovascular examination: Secondary | ICD-10-CM | POA: Diagnosis present

## 2023-11-26 DIAGNOSIS — I2510 Atherosclerotic heart disease of native coronary artery without angina pectoris: Secondary | ICD-10-CM | POA: Diagnosis present

## 2023-11-26 LAB — ECHOCARDIOGRAM COMPLETE
AR max vel: 3.17 cm2
AV Area VTI: 3.27 cm2
AV Area mean vel: 3.05 cm2
AV Mean grad: 4 mmHg
AV Peak grad: 8.2 mmHg
Ao pk vel: 1.43 m/s
Area-P 1/2: 3.21 cm2
S' Lateral: 2.3 cm

## 2024-02-03 ENCOUNTER — Other Ambulatory Visit: Payer: Self-pay | Admitting: *Deleted

## 2024-02-03 MED ORDER — TOPIRAMATE 50 MG PO TABS: 50.0000 mg | ORAL_TABLET | Freq: Every day | ORAL | 1 refills | Status: AC

## 2024-02-03 NOTE — Telephone Encounter (Signed)
 Last seen on 10/07/23 No follow up scheduled

## 2024-02-24 ENCOUNTER — Ambulatory Visit: Attending: Cardiology | Admitting: Cardiology

## 2024-02-24 VITALS — BP 138/68 | HR 74 | Ht 59.0 in | Wt 184.0 lb

## 2024-02-24 DIAGNOSIS — I2510 Atherosclerotic heart disease of native coronary artery without angina pectoris: Secondary | ICD-10-CM | POA: Diagnosis not present

## 2024-02-24 DIAGNOSIS — I5032 Chronic diastolic (congestive) heart failure: Secondary | ICD-10-CM

## 2024-02-24 NOTE — Patient Instructions (Signed)
 Medication Instructions:  The current medical regimen is effective;  continue present plan and medications.  *If you need a refill on your cardiac medications before your next appointment, please call your pharmacy*  Follow-Up: At [facility], you and your health needs are our priority.  As part of our continuing mission to provide you with exceptional heart care, our providers are all part of one team.  This team includes your primary Cardiologist (physician) and Advanced Practice Providers or APPs (Physician Assistants and Nurse Practitioners) who all work together to provide you with the care you need, when you need it.  Your next appointment:   6 month(s)  Provider:   Thom Sluder, PA-C          We recommend signing up for the patient portal called MyChart.  Sign up information is provided on this After Visit Summary.  MyChart is used to connect with patients for Virtual Visits (Telemedicine).  Patients are able to view lab/test results, encounter notes, upcoming appointments, etc.  Non-urgent messages can be sent to your provider as well.   To learn more about what you can do with MyChart, go to [email or web address].

## 2024-02-24 NOTE — Progress Notes (Signed)
 " Cardiology Office Note:  .   Date:  02/24/2024  ID:  [name], DOB 1945-06-08, MRN [id number] PCP: Dayna Motto, DO  [facility] Providers Cardiologist:  Oneil Parchment, MD    History of Present Illness: .   [name] is a 79 y.o. female Discussed the use of AI scribe  History of Present Illness [name] is a 79 year old female with chronic diastolic heart failure who presents for follow-up.  Her fluid status fluctuates. She is on torsemide  40 mg daily and spironolactone  25 mg daily. She previously tried Jardiance  but experienced diarrhea. An echocardiogram in October 2025 showed an ejection fraction of 60-65% with no significant changes. She is also on Plavix  due to a prior history of TIA.  She has nonobstructive coronary artery disease and hyperlipidemia. A coronary CTA in July 2023 showed a calcium  score of 1133 with moderate plaque in the proximal to mid LAD, which was insignificant by FFR. Her LDL was well controlled at 41 in July 2025. She is on rosuvastatin  5 mg and Zetia  10 mg.  She describes a sensation of falling backwards, which she has experienced for about a year to a year and a half. This sensation has led to falls, including a recent incident where she fell in her kitchen and was unable to get up without assistance. She feels unsteady on her feet and uses two canes at home. She has seen a neurologist and had a CT scan, which showed no abnormalities. She is concerned about her balance and the risk of falling, especially given her age and previous knee and hip issues.  She mentions having a bad knee that requires replacement and has had two operations on her hip. She recently moved to a townhouse to avoid stairs, as she was having trouble with them due to her knee. She uses a walker when necessary, such as when visiting her husband's grave.  She experienced a pain under her breast after a shower, which took her breath away. She  attributes it to indigestion, possibly related to eating grapefruit, which her daughter noted is not good for her diabetic routine. She is concerned about the possibility of a heart attack but notes the pain was not in the middle of her chest.      Studies Reviewed: .        Results Labs LDL (08/2023): 41  Radiology Coronary CTA (08/2021): Calcium  score 1133; moderate atherosclerotic plaque in proximal to mid left anterior descending artery; fractional flow reserve insignificant; patient asymptomatic  Diagnostic Echocardiogram (11/2023): No significant interval change; ejection fraction 60-65% Risk Assessment/Calculations:            Physical Exam:   VS:  BP 138/68 (BP Location: Left Arm, Patient Position: Sitting, Cuff Size: Normal)   Pulse 74   Ht 4' 11 (1.499 m)   Wt 184 lb (83.5 kg)   SpO2 95%   BMI 37.16 kg/m    Wt Readings from Last 3 Encounters:  02/24/24 184 lb (83.5 kg)  11/20/23 191 lb 3.2 oz (86.7 kg)  10/22/23 197 lb (89.4 kg)    GEN: Well nourished, well developed in no acute distress, walks very slowly and cautiously with a cane NECK: No JVD; No carotid bruits CARDIAC: RRR, no murmurs, no rubs, no gallops RESPIRATORY:  Clear to auscultation without rales, wheezing or rhonchi  ABDOMEN: Soft, non-tender, non-distended EXTREMITIES:  No edema; No deformity   ASSESSMENT AND PLAN: .  Assessment and Plan Assessment & Plan Chronic heart failure with preserved ejection fraction (HFpEF) Chronic diastolic heart failure with preserved ejection fraction. Fluid status fluctuates, likely related to rheumatoid arthritis. Previous trial of Jardiance  resulted in diarrhea. Current medications include torsemide  and spironolactone . Echocardiogram in October 2025 showed EF of 60-65% with no significant changes. Reports difficulty swallowing torsemide  due to pill size but she will continue to use. - Continue current heart failure management regimen.  Coronary artery  disease involving native coronary artery without angina Nonobstructive coronary artery disease with moderate plaque in the proximal to mid LAD. Asymptomatic with well-controlled LDL levels. On Plavix  for prior TIA and rosuvastatin  for hyperlipidemia. - Continue current management with Plavix  and rosuvastatin .  Hyperlipidemia Well-controlled with LDL at 41 mg/dL as of July 7974. On rosuvastatin  and ezetimibe . - Continue current lipid-lowering therapy with rosuvastatin  and ezetimibe .  She has had a few falls, has fallen backwards.  I do not think this is cardiac related.  I think this is a combination of disequilibrium, imbalance, difficulty with her joints especially knee and hip with stability.  She has moved out of her larger house and is now in a townhome.  I did discuss with her that she may require further assistance.  When she fell, she had to crawl to the living room and try to get up but could not.  Had to call her daughter to come help.  Denies syncope.  Has seen neurology in the past.       Dispo: 6 mth Thom Fus, Oneil Parchment, MD  "
